# Patient Record
Sex: Female | Born: 1990 | Hispanic: Yes | Marital: Married | Smoking: Never smoker
Health system: Southern US, Community
[De-identification: ages and names within clinical notes are randomized; demographics above are authoritative.]

## PROBLEM LIST (undated history)

## (undated) ENCOUNTER — Inpatient Hospital Stay (HOSPITAL_COMMUNITY): Payer: Self-pay

## (undated) DIAGNOSIS — K8020 Calculus of gallbladder without cholecystitis without obstruction: Secondary | ICD-10-CM

## (undated) DIAGNOSIS — Z30017 Encounter for initial prescription of implantable subdermal contraceptive: Principal | ICD-10-CM

## (undated) DIAGNOSIS — F419 Anxiety disorder, unspecified: Secondary | ICD-10-CM

## (undated) DIAGNOSIS — F909 Attention-deficit hyperactivity disorder, unspecified type: Secondary | ICD-10-CM

## (undated) DIAGNOSIS — Z3490 Encounter for supervision of normal pregnancy, unspecified, unspecified trimester: Secondary | ICD-10-CM

## (undated) HISTORY — DX: Anxiety disorder, unspecified: F41.9

## (undated) HISTORY — DX: Attention-deficit hyperactivity disorder, unspecified type: F90.9

## (undated) HISTORY — DX: Encounter for initial prescription of implantable subdermal contraceptive: Z30.017

## (undated) HISTORY — PX: NO PAST SURGERIES: SHX2092

---

## 2010-01-07 ENCOUNTER — Emergency Department (HOSPITAL_COMMUNITY): Admission: EM | Admit: 2010-01-07 | Discharge: 2010-01-07 | Payer: Self-pay | Admitting: Emergency Medicine

## 2010-08-21 LAB — URINALYSIS, ROUTINE W REFLEX MICROSCOPIC
Bilirubin Urine: NEGATIVE
Nitrite: NEGATIVE
Specific Gravity, Urine: 1.02 (ref 1.005–1.030)
Urobilinogen, UA: 0.2 mg/dL (ref 0.0–1.0)

## 2010-08-21 LAB — COMPREHENSIVE METABOLIC PANEL
ALT: 35 U/L (ref 0–35)
AST: 31 U/L (ref 0–37)
Albumin: 3.7 g/dL (ref 3.5–5.2)
CO2: 26 mEq/L (ref 19–32)
Calcium: 8.4 mg/dL (ref 8.4–10.5)
GFR calc Af Amer: >60 mL/min (ref >60)
GFR calc non Af Amer: >60 mL/min (ref >60)
Sodium: 139 mEq/L (ref 135–145)
Total Protein: 6.7 g/dL (ref 6.0–8.3)

## 2010-08-21 LAB — DIFFERENTIAL
Eosinophils Absolute: 0 10*3/uL (ref 0.0–0.7)
Eosinophils Relative: 0 % (ref 0–5)
Lymphs Abs: 0.7 10*3/uL (ref 0.7–4.0)
Monocytes Absolute: 0.6 10*3/uL (ref 0.1–1.0)
Monocytes Relative: 4 % (ref 3–12)

## 2010-08-21 LAB — URINE CULTURE: Culture: NO GROWTH

## 2010-08-21 LAB — URINE MICROSCOPIC-ADD ON

## 2010-08-21 LAB — CBC
Hemoglobin: 12.4 g/dL (ref 12.0–15.0)
MCHC: 34.4 g/dL (ref 30.0–36.0)

## 2010-08-21 LAB — PREGNANCY, URINE: Preg Test, Ur: NEGATIVE

## 2010-09-13 ENCOUNTER — Emergency Department (HOSPITAL_COMMUNITY)
Admission: EM | Admit: 2010-09-13 | Discharge: 2010-09-14 | Disposition: A | Discharge status: Home / Self Care | Payer: Medicaid Other | Attending: Emergency Medicine | Admitting: Emergency Medicine

## 2010-09-13 DIAGNOSIS — R197 Diarrhea, unspecified: Secondary | ICD-10-CM | POA: Insufficient documentation

## 2010-09-13 DIAGNOSIS — R109 Unspecified abdominal pain: Secondary | ICD-10-CM | POA: Insufficient documentation

## 2010-09-13 DIAGNOSIS — R1915 Other abnormal bowel sounds: Secondary | ICD-10-CM | POA: Insufficient documentation

## 2010-09-13 DIAGNOSIS — R112 Nausea with vomiting, unspecified: Secondary | ICD-10-CM | POA: Insufficient documentation

## 2010-09-14 LAB — URINALYSIS, ROUTINE W REFLEX MICROSCOPIC
Bilirubin Urine: NEGATIVE
Hgb urine dipstick: NEGATIVE
Specific Gravity, Urine: >1.03 — ABNORMAL HIGH (ref 1.005–1.030)
pH: 6.5 (ref 5.0–8.0)

## 2010-09-14 LAB — URINE MICROSCOPIC-ADD ON

## 2011-03-17 LAB — OB RESULTS CONSOLE ABO/RH

## 2011-03-17 LAB — OB RESULTS CONSOLE RUBELLA ANTIBODY, IGM: Rubella: IMMUNE

## 2011-05-14 ENCOUNTER — Observation Stay (HOSPITAL_COMMUNITY)
Admission: EM | Admit: 2011-05-14 | Discharge: 2011-05-15 | Disposition: A | Discharge status: Home / Self Care | Payer: Medicaid Other | Attending: Internal Medicine | Admitting: Internal Medicine

## 2011-05-14 ENCOUNTER — Encounter: Payer: Self-pay | Admitting: *Deleted

## 2011-05-14 DIAGNOSIS — J101 Influenza due to other identified influenza virus with other respiratory manifestations: Secondary | ICD-10-CM

## 2011-05-14 DIAGNOSIS — E860 Dehydration: Secondary | ICD-10-CM | POA: Diagnosis present

## 2011-05-14 DIAGNOSIS — J111 Influenza due to unidentified influenza virus with other respiratory manifestations: Secondary | ICD-10-CM | POA: Insufficient documentation

## 2011-05-14 DIAGNOSIS — R509 Fever, unspecified: Secondary | ICD-10-CM | POA: Insufficient documentation

## 2011-05-14 DIAGNOSIS — O99891 Other specified diseases and conditions complicating pregnancy: Principal | ICD-10-CM | POA: Insufficient documentation

## 2011-05-14 LAB — DIFFERENTIAL
Eosinophils Relative: 0 % (ref 0–5)
Lymphocytes Relative: 9 % — ABNORMAL LOW (ref 12–46)
Lymphs Abs: 0.7 10*3/uL (ref 0.7–4.0)
Monocytes Absolute: 0.5 10*3/uL (ref 0.1–1.0)
Neutro Abs: 6.4 10*3/uL (ref 1.7–7.7)

## 2011-05-14 LAB — INFLUENZA PANEL BY PCR (TYPE A & B)
H1N1 flu by pcr: NOT DETECTED
Influenza A By PCR: POSITIVE — AB

## 2011-05-14 LAB — CBC
HCT: 32.3 % — ABNORMAL LOW (ref 36.0–46.0)
MCV: 87.5 fL (ref 78.0–100.0)
Platelets: 188 10*3/uL (ref 150–400)
RBC: 3.69 MIL/uL — ABNORMAL LOW (ref 3.87–5.11)
WBC: 7.6 10*3/uL (ref 4.0–10.5)

## 2011-05-14 LAB — URINALYSIS, MICROSCOPIC ONLY
Bilirubin Urine: NEGATIVE
Ketones, ur: >80 mg/dL — AB
Leukocytes, UA: NEGATIVE
Nitrite: NEGATIVE
Urobilinogen, UA: 1 mg/dL (ref 0.0–1.0)
pH: 6.5 (ref 5.0–8.0)

## 2011-05-14 LAB — HCG, QUANTITATIVE, PREGNANCY: hCG, Beta Chain, Quant, S: 39199 m[IU]/mL — ABNORMAL HIGH (ref <5)

## 2011-05-14 LAB — BASIC METABOLIC PANEL
CO2: 21 mEq/L (ref 19–32)
Calcium: 9.2 mg/dL (ref 8.4–10.5)
Chloride: 99 mEq/L (ref 96–112)
GFR calc Af Amer: >90 mL/min (ref >90)
Sodium: 133 mEq/L — ABNORMAL LOW (ref 135–145)

## 2011-05-14 MED ORDER — MORPHINE SULFATE 2 MG/ML IJ SOLN
2.0000 mg | Freq: Once | INTRAMUSCULAR | Status: AC
  Administered 2011-05-14: 2 mg via INTRAVENOUS

## 2011-05-14 MED ORDER — OSELTAMIVIR PHOSPHATE 75 MG PO CAPS
75.0000 mg | ORAL_CAPSULE | Freq: Once | ORAL | Status: AC
  Administered 2011-05-14: 75 mg via ORAL

## 2011-05-14 MED ORDER — SODIUM CHLORIDE 0.9 % IV BOLUS (SEPSIS)
1000.0000 mL | Freq: Once | INTRAVENOUS | Status: AC
  Administered 2011-05-14: 1000 mL via INTRAVENOUS

## 2011-05-14 MED ORDER — ONDANSETRON HCL 4 MG/2ML IJ SOLN
4.0000 mg | Freq: Once | INTRAMUSCULAR | Status: AC
  Administered 2011-05-14: 4 mg via INTRAVENOUS

## 2011-05-14 MED ORDER — ACETAMINOPHEN 500 MG PO TABS
1000.0000 mg | ORAL_TABLET | Freq: Once | ORAL | Status: AC
  Administered 2011-05-14: 1000 mg via ORAL

## 2011-05-14 MED ORDER — SODIUM CHLORIDE 0.9 % IV SOLN
Freq: Once | INTRAVENOUS | Status: AC
  Administered 2011-05-14: 20:00:00 via INTRAVENOUS

## 2011-05-14 NOTE — ED Notes (Signed)
CRITICAL VALUE ALERT  Critical value received:  Positive flu swab  Date of notification:  05/14/2011  Time of notification:  21:28  Critical value read back: yes  Nurse who received alert:  Juliette Alcide, RN   MD notified (1st page):  Dr. Colon Branch  Time of first page:  21:29  MD notified (2nd page):  Time of second page:  Responding MD:   Time MD responded:

## 2011-05-14 NOTE — ED Notes (Signed)
Pt c/o fever and cough x 2 days; pt states she is [redacted] weeks pregnant; pt denies any pregnancy problems

## 2011-05-14 NOTE — ED Provider Notes (Signed)
History     CSN: [id number] Arrival date & time: 05/14/2011  7:00 PM   First MD Initiated Contact with Patient 05/14/11 1902      Chief Complaint  Patient presents with  . Fever  . Cough    (Consider location/radiation/quality/duration/timing/severity/associated sxs/prior treatment) HPI Comments: Linda Pollard is a 20 y.o. female G1, P0, [redacted] weeks pregnant , [facility] 10/17/11 who presents to the Emergency Department complaining of flu symptoms she has had for two days which include fever, chills, cough, headache myalgias  and nausea. She has taken tylenol with some relief.   History reviewed. No pertinent past medical history.  History reviewed. No pertinent past surgical history.  History reviewed. No pertinent family history.  History  Substance Use Topics  . Smoking status: Never Smoker   . Smokeless tobacco: Not on file  . Alcohol Use: No    OB History    Grav Para Term Preterm Abortions TAB SAB Ect Mult Living   1               Review of Systems 10 Systems reviewed and are negative for acute change except as noted in the HPI. Allergies  Review of patient's allergies indicates no known allergies.  Home Medications   Current Outpatient Rx  Name Route Sig Dispense Refill  . ACETAMINOPHEN 500 MG PO TABS Oral Take 500 mg by mouth once as needed. For fever/pain     . GUAIFENESIN 100 MG/5ML PO SOLN Oral Take 10 mLs by mouth 2 (two) times daily as needed. For cough     . PRENATAL 27-0.8 MG PO TABS Oral Take 1 tablet by mouth daily.        BP 112/53  Pulse 119  Temp(Src) 100.8 F (38.2 C) (Oral)  Resp 20  Ht 4\' 11"  (1.499 m)  Wt 146 lb (66.225 kg)  BMI 29.49 kg/m2  SpO2 97%  Physical Exam  Nursing note and vitals reviewed. Constitutional: She is oriented to person, place, and time. She appears well-developed and well-nourished.  HENT:  Head: Normocephalic and atraumatic.  Right Ear: External ear normal.  Left Ear: External ear normal.  Mouth/Throat:  Oropharynx is clear and moist.  Eyes: EOM are normal. Pupils are equal, round, and reactive to light.  Neck: Normal range of motion. Neck supple.  Cardiovascular: Normal heart sounds and intact distal pulses.        tachycardia  Pulmonary/Chest: Effort normal and breath sounds normal. No respiratory distress. She has no wheezes. She has no rales.  Abdominal: Soft. Bowel sounds are normal.  Musculoskeletal: Normal range of motion.  Neurological: She is alert and oriented to person, place, and time. She has normal reflexes.  Skin: Skin is warm and dry.  Psychiatric: She has a normal mood and affect.    ED Course  Procedures (including critical care time)  Results for orders placed during the hospital encounter of 05/14/11  CBC      Component Value Range   WBC 7.6  4.0 - 10.5 (K/uL)   RBC 3.69 (*) 3.87 - 5.11 (MIL/uL)   Hemoglobin 11.4 (*) 12.0 - 15.0 (g/dL)   HCT 40.9 (*) 81.1 - 46.0 (%)   MCV 87.5  78.0 - 100.0 (fL)   MCH 30.9  26.0 - 34.0 (pg)   MCHC 35.3  30.0 - 36.0 (g/dL)   RDW 91.4  78.2 - 95.6 (%)   Platelets 188  150 - 400 (K/uL)  DIFFERENTIAL      Component Value Range  Neutrophils Relative 85 (*) 43 - 77 (%)   Neutro Abs 6.4  1.7 - 7.7 (K/uL)   Lymphocytes Relative 9 (*) 12 - 46 (%)   Lymphs Abs 0.7  0.7 - 4.0 (K/uL)   Monocytes Relative 7  3 - 12 (%)   Monocytes Absolute 0.5  0.1 - 1.0 (K/uL)   Eosinophils Relative 0  0 - 5 (%)   Eosinophils Absolute 0.0  0.0 - 0.7 (K/uL)   Basophils Relative 0  0 - 1 (%)   Basophils Absolute 0.0  0.0 - 0.1 (K/uL)  INFLUENZA PANEL BY PCR      Component Value Range   Influenza A By PCR POSITIVE (*) NEGATIVE    Influenza B By PCR NEGATIVE  NEGATIVE    H1N1 flu by pcr NOT DETECTED  NOT DETECTED   HCG, QUANTITATIVE, PREGNANCY      Component Value Range   hCG, Beta Chain, Quant, S 39199 (*) <5 (mIU/mL)  URINALYSIS, MICROSCOPIC ONLY      Component Value Range   Color, Urine YELLOW  YELLOW    APPearance HAZY (*) CLEAR     Specific Gravity, Urine 1.025  1.005 - 1.030    pH 6.5  5.0 - 8.0    Glucose, UA NEGATIVE  NEGATIVE (mg/dL)   Hgb urine dipstick NEGATIVE  NEGATIVE    Bilirubin Urine NEGATIVE  NEGATIVE    Ketones, ur >80 (*) NEGATIVE (mg/dL)   Protein, ur 30 (*) NEGATIVE (mg/dL)   Urobilinogen, UA 1.0  0.0 - 1.0 (mg/dL)   Nitrite NEGATIVE  NEGATIVE    Leukocytes, UA NEGATIVE  NEGATIVE    WBC, UA 3-6  <3 (WBC/hpf)   RBC / HPF 0-2  <3 (RBC/hpf)   Bacteria, UA FEW (*) RARE    Squamous Epithelial / LPF MANY (*) RARE    Urine-Other MUCOUS PRESENT      2205 Spoke with Dr. Emelda Fear, OB/GYN. He advised that <20 weeks can be admitted by hospitalist. Advised we can administer tamiflu in pregnancy. 2212 Spoke with Dr. Earlene Plater, hospitalist who will admit patient. She asked patient be placed on telemetry.   MDM   Patient G1, P0, [redacted] weeks pregnant, [facility] 10/17/10 here with flu symptoms, positive influenza A result. Initiated  resusitation with IVF x 2 liters. HR normalized. Fever responded to tylenol.Patient taking PO fluids.Pt stable in ED with no significant deterioration in condition.The patient appears reasonably stabilized for admission considering the current resources, flow, and capabilities available in the ED at this time, and I doubt any other [facility] requiring further screening and/or treatment in the ED prior to admission.  MDM Reviewed: nursing note and vitals Interpretation: labs Total time providing critical care: 30. Consults: OB/GYN (hospitalist)          Nicoletta Dress. Colon Branch, MD 05/14/11 2219

## 2011-05-14 NOTE — H&P (Signed)
  Chief Complaint:  fever  HPI: 20 year old healthy female who is [redacted] weeks pregnant comes in with two-day history of fever cough and malaise found to have influenza in the ED. She is a little dehydrated and tachycardic from the fever. She denies any nausea vomiting diarrhea or any rashes. She's received 2-3 L of IV fluids in the emergency department and already feels better. She started received Tamiflu. Her significant other has also been running fever with the same symptoms who likely has the flu also. She has not received the flu shot this year.  Review of Systems:  Otherwise negative.  Past Medical History: neg  Medications: Prior to Admission medications   Medication Sig Start Date End Date Taking? Authorizing Provider  acetaminophen (TYLENOL) 500 MG tablet Take 500 mg by mouth once as needed. For fever/pain    Yes Historical Provider, MD  guaiFENesin (ROBITUSSIN) 100 MG/5ML SOLN Take 10 mLs by mouth 2 (two) times daily as needed. For cough    Yes Historical Provider, MD  Prenatal Vit-Fe Fumarate-FA (MULTIVITAMIN-PRENATAL) 27-0.8 MG TABS Take 1 tablet by mouth daily.     Yes Historical Provider, MD    Allergies:  No Known Allergies  Social History:  reports that she has never smoked. She does not have any smokeless tobacco history on file. She reports that she does not drink alcohol or use illicit drugs.  Family History: History reviewed. No pertinent family history.  Physical Exam: Filed Vitals:   05/14/11 1600 05/14/11 1605 05/14/11 2022 05/14/11 2227  BP:  97/71 112/53 97/36  Pulse:  142 119 117  Temp:  102.1 F (38.9 C) 100.8 F (38.2 C) 98.5 F (36.9 C)  TempSrc:  Oral Oral   Resp:  20 20 19   Height: 4\' 11"  (1.499 m)     Weight: 66.225 kg (146 lb)     SpO2:  100% 97% 98%   General appearance: alert, cooperative and no distress Resp: clear to auscultation bilaterally Cardio: regular rate and rhythm, S1, S2 normal, no murmur, click, rub or gallop GI: soft,  non-tender; bowel sounds normal; no masses,  no organomegaly Extremities: extremities normal, atraumatic, no cyanosis or edema Pulses: 2+ and symmetric Skin: Skin color, texture, turgor normal. No rashes or lesions Neurologic: Grossly normal   Labs on Admission:   [facility] 05/14/11 1947  NA 133*  K 2.9*  CL 99  CO2 21  GLUCOSE 106*  BUN 5*  CREATININE 0.48*  CALCIUM 9.2  MG --  PHOS --    Basename 05/14/11 1946  WBC 7.6  NEUTROABS 6.4  HGB 11.4*  HCT 32.3*  MCV 87.5  PLT 188    Radiological Exams on Admission: No results found.  Assessment/Plan Present on Admission:  20 yo female 16 wks IUP here with influenza and mild dehydration. .Influenza tamiflu bid.  No cxr done due to pregnancy. .Dehydration ivf overnight.  obs.  Kila Godina A 05/14/2011, 11:49 PM

## 2011-05-14 NOTE — ED Notes (Signed)
Pt states she has also been vomiting

## 2011-05-14 NOTE — ED Notes (Signed)
REPORT given to [place], [place]

## 2011-05-14 NOTE — ED Notes (Signed)
Pt c/o fever, cough, uri for a few days, n/v last night, recent exposure to sick family members,

## 2011-05-14 NOTE — ED Notes (Signed)
Dr. Colon Branch notified of positive flu results

## 2011-05-15 ENCOUNTER — Encounter (HOSPITAL_COMMUNITY): Payer: Self-pay | Admitting: *Deleted

## 2011-05-15 LAB — CBC
Hemoglobin: 10.2 g/dL — ABNORMAL LOW (ref 12.0–15.0)
MCH: 30.4 pg (ref 26.0–34.0)
Platelets: 165 10*3/uL (ref 150–400)
RBC: 3.36 MIL/uL — ABNORMAL LOW (ref 3.87–5.11)
WBC: 4.6 10*3/uL (ref 4.0–10.5)

## 2011-05-15 LAB — BASIC METABOLIC PANEL
Calcium: 8.3 mg/dL — ABNORMAL LOW (ref 8.4–10.5)
GFR calc non Af Amer: >90 mL/min (ref >90)
Glucose, Bld: 96 mg/dL (ref 70–99)
Potassium: 3.7 mEq/L (ref 3.5–5.1)
Sodium: 137 mEq/L (ref 135–145)

## 2011-05-15 MED ORDER — OSELTAMIVIR PHOSPHATE 75 MG PO CAPS
75.0000 mg | ORAL_CAPSULE | Freq: Two times a day (BID) | ORAL | Status: DC
  Administered 2011-05-15: 75 mg via ORAL
  Filled 2011-05-15: qty 1

## 2011-05-15 MED ORDER — ONDANSETRON HCL 4 MG/2ML IJ SOLN: 4.0000 mg | Freq: Four times a day (QID) | INTRAMUSCULAR | Status: DC | PRN

## 2011-05-15 MED ORDER — ACETAMINOPHEN 325 MG PO TABS
ORAL_TABLET | ORAL | Status: AC
  Administered 2011-05-15: 650 mg
  Filled 2011-05-15: qty 2

## 2011-05-15 MED ORDER — OSELTAMIVIR PHOSPHATE 75 MG PO CAPS: 75.0000 mg | ORAL_CAPSULE | Freq: Two times a day (BID) | ORAL | Status: AC

## 2011-05-15 MED ORDER — ACETAMINOPHEN 325 MG PO TABS: 650.0000 mg | ORAL_TABLET | Freq: Four times a day (QID) | ORAL | Status: DC | PRN

## 2011-05-15 MED ORDER — POTASSIUM CHLORIDE IN NACL 40-0.9 MEQ/L-% IV SOLN
INTRAVENOUS | Status: AC
  Filled 2011-05-15: qty 1000

## 2011-05-15 MED ORDER — ONDANSETRON HCL 4 MG PO TABS: 4.0000 mg | ORAL_TABLET | Freq: Four times a day (QID) | ORAL | Status: DC | PRN

## 2011-05-15 MED ORDER — POTASSIUM CHLORIDE IN NACL 40-0.9 MEQ/L-% IV SOLN
INTRAVENOUS | Status: DC
  Administered 2011-05-15 (×2): via INTRAVENOUS
  Filled 2011-05-15 (×5): qty 1000

## 2011-05-15 MED ORDER — PRENATAL 27-0.8 MG PO TABS
1.0000 | ORAL_TABLET | Freq: Every day | ORAL | Status: DC
  Administered 2011-05-15: 1 via ORAL
  Filled 2011-05-15 (×3): qty 1

## 2011-05-15 NOTE — Discharge Summary (Signed)
Patient ID: Linda Pollard MRN: [id number] DOB/AGE: 25-Oct-1990 20 y.o.  Admit date: 05/14/2011 Discharge date: 05/15/2011  Primary Care Physician:  No primary provider on file.  Discharge Diagnoses:    Present on Admission:  .Influenza .Dehydration Tachycardia, improved Fever Inrauterine pregnancy   Current Discharge Medication List    START taking these medications   Details  oseltamivir (TAMIFLU) 75 MG capsule Take 1 capsule (75 mg total) by mouth 2 (two) times daily. Qty: 8 capsule, Refills: 0      CONTINUE these medications which have NOT CHANGED   Details  acetaminophen (TYLENOL) 500 MG tablet Take 500 mg by mouth once as needed. For fever/pain     guaiFENesin (ROBITUSSIN) 100 MG/5ML SOLN Take 10 mLs by mouth 2 (two) times daily as needed. For cough     Prenatal Vit-Fe Fumarate-FA (MULTIVITAMIN-PRENATAL) 27-0.8 MG TABS Take 1 tablet by mouth daily.          Disposition and Follow-up: Follow up with OB/GYN as previously scheduled  Consults: None.  Significant Diagnostic Studies:  No results found.  Brief H and P: For complete details please refer to admission H and P, but in brief 20 year old healthy female who is [redacted] weeks pregnant comes in with two-day history of fever cough and malaise found to have influenza in the ED. She is a little dehydrated and tachycardic from the fever. She denies any nausea vomiting diarrhea or any rashes. She's received 2-3 L of IV fluids in the emergency department and already feels better. She started received Tamiflu. Her significant other has also been running fever with the same symptoms who likely has the flu also. She has not received the flu shot this year.   Physical Exam on Discharge:  Filed Vitals:   05/15/11 0010 05/15/11 0300 05/15/11 0600 05/15/11 0900  BP: 112/60  102/65   Pulse: 110  128   Temp: 97.9 F (36.6 C)  98.4 F (36.9 C) 99.4 F (37.4 C)  TempSrc: Oral  Oral   Resp: 16  18   Height: 4\' 11"  (1.499 m)      Weight: 67.1 kg (147 lb 14.9 oz) 67.2 kg (148 lb 2.4 oz)    SpO2: 97%  98%      Intake/Output Summary (Last 24 hours) at 05/15/11 1353 Last data filed at 05/15/11 1200  Gross per 24 hour  Intake   3639 ml  Output   1250 ml  Net   2389 ml   HR has improved and currently in 80's on manual check, she is ambulating without dizziness General: Alert, awake, oriented x3, in no acute distress. HEENT: No bruits, no goiter. Heart: Regular rate and rhythm, without murmurs, rubs, gallops. Lungs: Clear to auscultation bilaterally. Abdomen: Soft, nontender, nondistended, positive bowel sounds. Extremities: No clubbing cyanosis or edema with positive pedal pulses. Neuro: Grossly intact, nonfocal.  CBC:    Component Value Date/Time   WBC 4.6 05/15/2011 0720   HGB 10.2* 05/15/2011 0720   HCT 29.9* 05/15/2011 0720   PLT 165 05/15/2011 0720   MCV 89.0 05/15/2011 0720   NEUTROABS 6.4 05/14/2011 1946   LYMPHSABS 0.7 05/14/2011 1946   MONOABS 0.5 05/14/2011 1946   EOSABS 0.0 05/14/2011 1946   BASOSABS 0.0 05/14/2011 1946    Basic Metabolic Panel:    Component Value Date/Time   NA 137 05/15/2011 0720   K 3.7 05/15/2011 0720   CL 106 05/15/2011 0720   CO2 26 05/15/2011 0720   BUN <3* 05/15/2011 0720  CREATININE 0.51 05/15/2011 0720   GLUCOSE 96 05/15/2011 0720   CALCIUM 8.3* 05/15/2011 0720    Hospital Course:  Principal Problem:  *Influenza, Patient was essentially admitted for supportive treatment.  She was given IV fluids and started on tamiflu.  She reports symptomatic improvement with this treatment.  She is eating and drinking without any nausea and vomiting.  She is ambulating without dizziness.  I feel that she no longer requires inpatient acute hospital care.  We will plan on discharging her today.  I have encouraged her to drink plenty of fluids and use tylenol as needed for pain/fever.   Active Problems:  IUP (intrauterine pregnancy), incidental, case was discussed by ED staff with Dr.  Emelda Fear and it was felt that they did not need to officially consult on the patient.  Patient will follow up with her primary ob/gyn  Dehydration, resolved with IVF   Time spent on Discharge:  Signed: Retha Bither 05/15/2011, 1:53 PM

## 2011-05-15 NOTE — Progress Notes (Signed)
Fetal heart rate 137

## 2011-05-17 NOTE — Progress Notes (Signed)
Utilization review completed.  

## 2011-06-05 ENCOUNTER — Encounter (HOSPITAL_COMMUNITY): Payer: Self-pay

## 2011-06-05 ENCOUNTER — Emergency Department (HOSPITAL_COMMUNITY)
Admission: EM | Admit: 2011-06-05 | Discharge: 2011-06-05 | Disposition: A | Discharge status: Home / Self Care | Payer: Medicaid Other | Attending: Emergency Medicine | Admitting: Emergency Medicine

## 2011-06-05 DIAGNOSIS — R1031 Right lower quadrant pain: Secondary | ICD-10-CM | POA: Insufficient documentation

## 2011-06-05 DIAGNOSIS — F411 Generalized anxiety disorder: Secondary | ICD-10-CM | POA: Insufficient documentation

## 2011-06-05 DIAGNOSIS — M79609 Pain in unspecified limb: Secondary | ICD-10-CM | POA: Insufficient documentation

## 2011-06-05 DIAGNOSIS — O2390 Unspecified genitourinary tract infection in pregnancy, unspecified trimester: Secondary | ICD-10-CM | POA: Insufficient documentation

## 2011-06-05 DIAGNOSIS — N390 Urinary tract infection, site not specified: Secondary | ICD-10-CM | POA: Insufficient documentation

## 2011-06-05 LAB — CBC
HCT: 34.5 % — ABNORMAL LOW (ref 36.0–46.0)
Hemoglobin: 11.7 g/dL — ABNORMAL LOW (ref 12.0–15.0)
MCH: 30.5 pg (ref 26.0–34.0)
MCHC: 33.9 g/dL (ref 30.0–36.0)
RDW: 14.3 % (ref 11.5–15.5)

## 2011-06-05 LAB — BASIC METABOLIC PANEL
BUN: 5 mg/dL — ABNORMAL LOW (ref 6–23)
Calcium: 10.1 mg/dL (ref 8.4–10.5)
Creatinine, Ser: 0.49 mg/dL — ABNORMAL LOW (ref 0.50–1.10)
GFR calc Af Amer: >90 mL/min (ref >90)
GFR calc non Af Amer: >90 mL/min (ref >90)

## 2011-06-05 LAB — DIFFERENTIAL
Basophils Relative: 0 % (ref 0–1)
Eosinophils Absolute: 0 10*3/uL (ref 0.0–0.7)
Monocytes Absolute: 0.8 10*3/uL (ref 0.1–1.0)
Monocytes Relative: 7 % (ref 3–12)

## 2011-06-05 LAB — URINALYSIS, ROUTINE W REFLEX MICROSCOPIC
Ketones, ur: 15 mg/dL — AB
Nitrite: NEGATIVE
Protein, ur: 30 mg/dL — AB
Urobilinogen, UA: 0.2 mg/dL (ref 0.0–1.0)

## 2011-06-05 LAB — URINE MICROSCOPIC-ADD ON

## 2011-06-05 LAB — WET PREP, GENITAL: Clue Cells Wet Prep HPF POC: NONE SEEN

## 2011-06-05 MED ORDER — NITROFURANTOIN MONOHYD MACRO 100 MG PO CAPS: 100.0000 mg | ORAL_CAPSULE | Freq: Two times a day (BID) | ORAL | Status: AC

## 2011-06-05 MED ORDER — SODIUM CHLORIDE 0.9 % IV BOLUS (SEPSIS)
500.0000 mL | Freq: Once | INTRAVENOUS | Status: AC
  Administered 2011-06-05: 500 mL via INTRAVENOUS

## 2011-06-05 MED ORDER — NITROFURANTOIN MACROCRYSTAL 100 MG PO CAPS
ORAL_CAPSULE | ORAL | Status: AC
  Administered 2011-06-05: 100 mg
  Filled 2011-06-05: qty 1

## 2011-06-05 MED ORDER — NITROFURANTOIN MONOHYD MACRO 100 MG PO CAPS
100.0000 mg | ORAL_CAPSULE | Freq: Once | ORAL | Status: DC
  Filled 2011-06-05: qty 1

## 2011-06-05 NOTE — ED Provider Notes (Signed)
This chart was scribed for [employer]. Rubin Payor, MD by [place]is Mart. The patient was seen in room APA09/APA09 and the patient's care was started at 6:16 PM.    CSN: [id number]  Arrival date & time 06/05/11  1743   First MD Initiated Contact with Patient 06/05/11 1809      Chief Complaint  Patient presents with  . Abdominal Cramping  . Leg Pain    (Consider location/radiation/quality/duration/timing/severity/associated sxs/prior treatment) HPI Pt seen at 6:16 PM Linda Pollard is a 20 y.o. [redacted] weeks pregnant  female who presents to the Emergency Department complaining of sudden onset, constant, gradually worsening right lower abdominal pain that started 2 days ago .  Pt states that pain started in the right leg and radiated to the abdomen.  Pt describes pain as "cramping".    Pt c/o yellowish vaginal discharge, "brownish" urine, and spotted some blood on the toilet paper after urinating in the ER .  Pt c/o subjective fever. Pt denies dysuria, n/v/d, constipation.  Pt's last ultrasound was 1 month ago with normal results and next ultrasound is Thurs.    PCP: Dr Emelda Fear  History reviewed. No pertinent past medical history.  History reviewed. No pertinent past surgical history.  No family history on file.  History  Substance Use Topics  . Smoking status: Never Smoker   . Smokeless tobacco: Never Used  . Alcohol Use: No    OB History    Grav Para Term Preterm Abortions TAB SAB Ect Mult Living   1               Review of Systems  10 Systems reviewed and are negative for acute change except as noted in the HPI.   Allergies  Review of patient's allergies indicates no known allergies.  Home Medications   Current Outpatient Rx  Name Route Sig Dispense Refill  . ACETAMINOPHEN 500 MG PO TABS Oral Take 500 mg by mouth once as needed. For fever/pain     . GUAIFENESIN 100 MG/5ML PO SOLN Oral Take 10 mLs by mouth 2 (two) times daily as needed. For cough     . PRENATAL  27-0.8 MG PO TABS Oral Take 1 tablet by mouth daily.      Marland Kitchen NITROFURANTOIN MONOHYD MACRO 100 MG PO CAPS Oral Take 1 capsule (100 mg total) by mouth 2 (two) times daily. 9 capsule 0    BP 108/60  Pulse 100  Temp(Src) 98.8 F (37.1 C) (Oral)  Resp 22  Ht 4\' 11"  (1.499 m)  Wt 143 lb (64.864 kg)  BMI 28.88 kg/m2  SpO2 98%  Physical Exam  Nursing note and vitals reviewed. Constitutional: She is oriented to person, place, and time. She appears well-developed and well-nourished.       Pt appears anxious and tearful  HENT:  Head: Normocephalic and atraumatic.  Eyes: EOM are normal. Pupils are equal, round, and reactive to light.  Neck: Normal range of motion. Neck supple. No tracheal deviation present.  Cardiovascular: Normal rate, regular rhythm and normal heart sounds.   Pulmonary/Chest: Effort normal and breath sounds normal. No respiratory distress.  Abdominal: Soft. She exhibits no distension.       Gravid to umbilicus  Musculoskeletal: Normal range of motion. She exhibits no edema.  Neurological: She is alert and oriented to person, place, and time. No sensory deficit.  Skin: Skin is warm and dry.  Psychiatric: She has a normal mood and affect. Her behavior is normal.   pelvic exam showed  no vaginal discharge. No active bleeding although there was some blood externally.  ED Course  Procedures (including critical care time) DIAGNOSTIC STUDIES: Oxygen Saturation is 98% on room air, normal by my interpretation.    COORDINATION OF CARE:    Labs Reviewed  CBC - Abnormal; Notable for the following:    WBC 11.8 (*)    RBC 3.84 (*)    Hemoglobin 11.7 (*)    HCT 34.5 (*)    All other components within normal limits  DIFFERENTIAL - Abnormal; Notable for the following:    Neutro Abs 8.9 (*)    All other components within normal limits  BASIC METABOLIC PANEL - Abnormal; Notable for the following:    Glucose, Bld 107 (*)    BUN 5 (*)    Creatinine, Ser 0.49 (*)    All other  components within normal limits  URINALYSIS, ROUTINE W REFLEX MICROSCOPIC - Abnormal; Notable for the following:    APPearance CLOUDY (*)    Hgb urine dipstick LARGE (*)    Bilirubin Urine SMALL (*)    Ketones, ur 15 (*)    Protein, ur 30 (*)    Leukocytes, UA TRACE (*)    All other components within normal limits  WET PREP, GENITAL - Abnormal; Notable for the following:    WBC, Wet Prep HPF POC FEW (*)    All other components within normal limits  URINE MICROSCOPIC-ADD ON - Abnormal; Notable for the following:    Bacteria, UA MANY (*)    All other components within normal limits   No results found.   1. UTI (lower urinary tract infection)   2. IUP (intrauterine pregnancy), incidental       MDM  Patient has lower abdominal cramping pain in her right leg. She is [redacted] weeks pregnant. She sees Dr. Emelda Fear. Lab work is remarkable for urinary tract infection. The pelvic exam did not show an open os or active bleeding. She's given antibiotics and will followup with GYN.  I personally performed the services described in this documentation, which was scribed in my presence. The recorded information has been reviewed and considered.         Juliet Rude. Rubin Payor, MD 06/06/11 [phone]

## 2011-06-05 NOTE — ED Notes (Signed)
Pt presents with right lower abdominal cramping that radiates into right leg x 2 days. Pt is 19 weeks and 4 days by pt report. Pt OB-GYN is [facility] Emelda Fear. Pt denies spotting but states urine was brown this AM.

## 2011-06-07 ENCOUNTER — Encounter (HOSPITAL_COMMUNITY): Payer: Self-pay | Admitting: Emergency Medicine

## 2011-06-07 ENCOUNTER — Emergency Department (HOSPITAL_COMMUNITY)
Admission: EM | Admit: 2011-06-07 | Discharge: 2011-06-08 | Disposition: A | Discharge status: Home / Self Care | Payer: Medicaid Other | Attending: Emergency Medicine | Admitting: Emergency Medicine

## 2011-06-07 DIAGNOSIS — R0602 Shortness of breath: Secondary | ICD-10-CM | POA: Insufficient documentation

## 2011-06-07 DIAGNOSIS — R079 Chest pain, unspecified: Secondary | ICD-10-CM | POA: Insufficient documentation

## 2011-06-07 DIAGNOSIS — M549 Dorsalgia, unspecified: Secondary | ICD-10-CM | POA: Insufficient documentation

## 2011-06-07 DIAGNOSIS — R10819 Abdominal tenderness, unspecified site: Secondary | ICD-10-CM | POA: Insufficient documentation

## 2011-06-07 DIAGNOSIS — R109 Unspecified abdominal pain: Secondary | ICD-10-CM | POA: Insufficient documentation

## 2011-06-07 DIAGNOSIS — R112 Nausea with vomiting, unspecified: Secondary | ICD-10-CM | POA: Insufficient documentation

## 2011-06-07 DIAGNOSIS — R1011 Right upper quadrant pain: Secondary | ICD-10-CM

## 2011-06-07 MED ORDER — ONDANSETRON HCL 4 MG/2ML IJ SOLN
4.0000 mg | Freq: Once | INTRAMUSCULAR | Status: AC
  Administered 2011-06-07: 4 mg via INTRAVENOUS
  Filled 2011-06-07: qty 2

## 2011-06-07 MED ORDER — SODIUM CHLORIDE 0.9 % IV BOLUS (SEPSIS)
1000.0000 mL | INTRAVENOUS | Status: AC
  Administered 2011-06-07: 1000 mL via INTRAVENOUS

## 2011-06-07 NOTE — ED Provider Notes (Signed)
History     CSN: [id number]  Arrival date & time 06/07/11  2308   First MD Initiated Contact with Patient 06/07/11 2319      Chief Complaint  Patient presents with  . Shortness of Breath  . Chest Pain  . Nausea  . Emesis  . Abdominal Pain  . Back Pain    (Consider location/radiation/quality/duration/timing/severity/associated sxs/prior treatment) HPI Comments: 20 year old female at [redacted] weeks gestation who presents with a complaint of epigastric pain. This is associated with mild shortness of breath and severe nausea and vomiting. The symptoms came on acutely approximately 2 hours prior to arrival, has been persistent and nothing makes better or worse. She denies dysuria, diarrhea, lower abdominal pain, leg pain, but states that the pain does radiate to her back. She denies alcohol use, tobacco use and has no history of pancreatitis, cholecystitis or prior abdominal surgery.  She was seen in the last 48 hours for a urinary tract infection and states that her symptoms had totally improved. The pain that she is having tonight is totally different than the pain she had 2 days ago.  Patient is a 20 y.o. female presenting with shortness of breath, chest pain, vomiting, abdominal pain, and back pain. The history is provided by the patient and medical records.  Shortness of Breath  Associated symptoms include chest pain and shortness of breath.  Chest Pain Primary symptoms include shortness of breath, abdominal pain and vomiting.    Emesis  Associated symptoms include abdominal pain.  Abdominal Pain The primary symptoms of the illness include abdominal pain, shortness of breath and vomiting.  Additional symptoms associated with the illness include back pain.  Back Pain  Associated symptoms include chest pain and abdominal pain.    History reviewed. No pertinent past medical history.  History reviewed. No pertinent past surgical history.  History reviewed. No pertinent family  history.  History  Substance Use Topics  . Smoking status: Never Smoker   . Smokeless tobacco: Never Used  . Alcohol Use: No    OB History    Grav Para Term Preterm Abortions TAB SAB Ect Mult Living   1               Review of Systems  Respiratory: Positive for shortness of breath.   Cardiovascular: Positive for chest pain.  Gastrointestinal: Positive for vomiting and abdominal pain.  Musculoskeletal: Positive for back pain.  All other systems reviewed and are negative.    Allergies  Review of patient's allergies indicates no known allergies.  Home Medications   Current Outpatient Rx  Name Route Sig Dispense Refill  . ACETAMINOPHEN 500 MG PO TABS Oral Take 500 mg by mouth once as needed. For fever/pain     . GUAIFENESIN 100 MG/5ML PO SOLN Oral Take 10 mLs by mouth 2 (two) times daily as needed. For cough     . NITROFURANTOIN MONOHYD MACRO 100 MG PO CAPS Oral Take 1 capsule (100 mg total) by mouth 2 (two) times daily. 9 capsule 0  . PRENATAL 27-0.8 MG PO TABS Oral Take 1 tablet by mouth daily.        BP 97/51  Pulse 89  Temp 98.5 F (36.9 C)  Resp 18  Ht 4\' 11"  (1.499 m)  Wt 146 lb (66.225 kg)  BMI 29.49 kg/m2  SpO2 98%  Physical Exam  Nursing note and vitals reviewed. Constitutional: She appears well-developed and well-nourished.  HENT:  Head: Normocephalic and atraumatic.  Mouth/Throat: Oropharynx is clear and  moist. No oropharyngeal exudate.  Eyes: Conjunctivae and EOM are normal. Pupils are equal, round, and reactive to light. Right eye exhibits no discharge. Left eye exhibits no discharge. No scleral icterus.  Neck: Normal range of motion. Neck supple. No JVD present. No thyromegaly present.  Cardiovascular: Normal rate, regular rhythm, normal heart sounds and intact distal pulses.  Exam reveals no gallop and no friction rub.   No murmur heard. Pulmonary/Chest: Effort normal and breath sounds normal. No respiratory distress. She has no wheezes. She has no  rales.  Abdominal: Soft. Bowel sounds are normal. She exhibits no distension and no mass. There is tenderness ( epigastric tenderness to palpation.  ).       No lower abdominal tenderness, no suprapubic, no right lower quadrant, no CVA tenderness. Non-peritoneal  Musculoskeletal: Normal range of motion. She exhibits no edema and no tenderness.  Lymphadenopathy:    She has no cervical adenopathy.  Neurological: She is alert. Coordination normal.  Skin: Skin is warm and dry. No rash noted. No erythema.  Psychiatric: She has a normal mood and affect. Her behavior is normal.    ED Course  Procedures (including critical care time)  Labs Reviewed  COMPREHENSIVE METABOLIC PANEL - Abnormal; Notable for the following:    Potassium 3.0 (*)    Glucose, Bld 124 (*)    Creatinine, Ser 0.44 (*)    Albumin 3.3 (*)    Total Bilirubin 0.2 (*)    All other components within normal limits  CBC - Abnormal; Notable for the following:    WBC 15.7 (*)    RBC 3.66 (*)    Hemoglobin 11.2 (*)    HCT 32.5 (*)    All other components within normal limits  DIFFERENTIAL - Abnormal; Notable for the following:    Neutro Abs 11.3 (*)    All other components within normal limits  URINALYSIS, ROUTINE W REFLEX MICROSCOPIC - Abnormal; Notable for the following:    Hgb urine dipstick LARGE (*)    Ketones, ur 15 (*)    All other components within normal limits  URINE MICROSCOPIC-ADD ON - Abnormal; Notable for the following:    Squamous Epithelial / LPF FEW (*)    Bacteria, UA MANY (*)    All other components within normal limits  LIPASE, BLOOD   No results found.   No diagnosis found.    MDM  Acute onset of epigastric pain. Will investigate with laboratory workup, IV fluids and Zofran.   Patient has responded somewhat to Zofran but has not improved with GI cocktail.  Laboratory evaluation shows low potassium, normal renal function, normal lipase, normal liver function tests. Urinalysis is clean other  than ketones, IV fluids have been given, blood counts elevated at 15.7.  On reevaluation the patient has persistent epigastric and right upper quadrant tenderness. Ultrasound ordered to rule out cholecystitis.   0700 AM - Change of shift, care signed out to Dr. Tama Headings, MD 06/08/11 [phone]

## 2011-06-07 NOTE — ED Notes (Addendum)
Patient reports upper abdominal pain, chest pain, nausea, vomiting, and shortness of breath that has been going on for about an hour and pain radiates to mid back. Patient seen here 2 days ago and diagnosed with UTI, now taking Macrobid.

## 2011-06-07 NOTE — ED Notes (Signed)
Pt c/o sob, n/v, chest pain, upper abdominal pain that radiates to her back. Pt is [redacted] wks pregnant.

## 2011-06-08 LAB — COMPREHENSIVE METABOLIC PANEL
ALT: 14 U/L (ref 0–35)
AST: 16 U/L (ref 0–37)
CO2: 25 mEq/L (ref 19–32)
Calcium: 9.5 mg/dL (ref 8.4–10.5)
Creatinine, Ser: 0.44 mg/dL — ABNORMAL LOW (ref 0.50–1.10)
GFR calc Af Amer: >90 mL/min (ref >90)
GFR calc non Af Amer: >90 mL/min (ref >90)
Glucose, Bld: 124 mg/dL — ABNORMAL HIGH (ref 70–99)
Potassium: 3 mEq/L — ABNORMAL LOW (ref 3.5–5.1)
Sodium: 136 mEq/L (ref 135–145)
Total Bilirubin: 0.2 mg/dL — ABNORMAL LOW (ref 0.3–1.2)

## 2011-06-08 LAB — DIFFERENTIAL
Basophils Absolute: 0 10*3/uL (ref 0.0–0.1)
Basophils Relative: 0 % (ref 0–1)
Eosinophils Absolute: 0.1 10*3/uL (ref 0.0–0.7)
Neutro Abs: 11.3 10*3/uL — ABNORMAL HIGH (ref 1.7–7.7)
Neutrophils Relative %: 72 % (ref 43–77)

## 2011-06-08 LAB — URINALYSIS, ROUTINE W REFLEX MICROSCOPIC
Leukocytes, UA: NEGATIVE
Nitrite: NEGATIVE
Protein, ur: NEGATIVE mg/dL
Specific Gravity, Urine: 1.015 (ref 1.005–1.030)
Urobilinogen, UA: 0.2 mg/dL (ref 0.0–1.0)

## 2011-06-08 LAB — URINE MICROSCOPIC-ADD ON

## 2011-06-08 LAB — CBC
Hemoglobin: 11.2 g/dL — ABNORMAL LOW (ref 12.0–15.0)
MCH: 30.6 pg (ref 26.0–34.0)
MCHC: 34.5 g/dL (ref 30.0–36.0)
Platelets: 244 10*3/uL (ref 150–400)

## 2011-06-08 MED ORDER — ONDANSETRON HCL 4 MG PO TABS: 4.0000 mg | ORAL_TABLET | Freq: Four times a day (QID) | ORAL | Status: DC

## 2011-06-08 MED ORDER — HYDROCODONE-ACETAMINOPHEN 5-500 MG PO TABS: 1.0000 | ORAL_TABLET | Freq: Four times a day (QID) | ORAL | Status: AC | PRN

## 2011-06-08 MED ORDER — MORPHINE SULFATE 4 MG/ML IJ SOLN
4.0000 mg | Freq: Once | INTRAMUSCULAR | Status: AC
  Administered 2011-06-08: 4 mg via INTRAVENOUS
  Filled 2011-06-08: qty 1

## 2011-06-08 MED ORDER — GI COCKTAIL ~~LOC~~
30.0000 mL | Freq: Once | ORAL | Status: AC
  Administered 2011-06-08: 30 mL via ORAL
  Filled 2011-06-08: qty 30

## 2011-06-08 MED ORDER — ONDANSETRON HCL 4 MG/2ML IJ SOLN
4.0000 mg | Freq: Once | INTRAMUSCULAR | Status: AC
  Administered 2011-06-08: 4 mg via INTRAVENOUS
  Filled 2011-06-08: qty 2

## 2011-06-08 MED ORDER — SODIUM CHLORIDE 0.9 % IV BOLUS (SEPSIS): 1000.0000 mL | Freq: Once | INTRAVENOUS | Status: DC

## 2011-06-08 NOTE — ED Provider Notes (Signed)
Care assumed from Dr. Hyacinth Meeker.  I agree with his note, assessment and plan.  Radiology called and informed me they would be unable to complete the ultrasound of the gallbladder as it is a holiday, but were able to arrange an appointment for tomorrow morning.  She will be discharged with medications for pain and nausea, to return prn.  Geoffery Lyons, MD 06/08/11 0830

## 2011-06-08 NOTE — L&D Delivery Note (Signed)
Operative Delivery Note At 3:42 AM a viable and healthy female was delivered via Vacuum Assisted Vaginal Delivery by Dr. Despina Hidden.  Presentation: vertex; Position: Left,, Right,, Anterior; Station: +3.  Verbal consent: obtained from patient for VAVD.  Risks and benefits discussed in detail.  Risks include, but are not limited to the risks of anesthesia, bleeding, infection, damage to maternal tissues, fetal cephalhematoma.  There is also the risk of inability to effect vaginal delivery of the head, or shoulder dystocia that cannot be resolved by established maneuvers, leading to the need for emergency cesarean section.   APGAR: , ; weight .   Placenta status: , .   Cord:  with the following complications: .  Cord pH: n/a  Anesthesia: Epidural  Episiotomy: None Lacerations: 2nd degree  Suture Repair: 3.0 vicryl rapide Est. Blood Loss (mL):   Mom to postpartum.  Baby to nursery-stable.  [facility] 10/31/2011, 3:51 AM

## 2011-06-08 NOTE — ED Notes (Signed)
Pt reports nausea is better.

## 2011-06-09 ENCOUNTER — Encounter (HOSPITAL_COMMUNITY): Payer: Self-pay | Admitting: Oncology

## 2011-06-09 ENCOUNTER — Ambulatory Visit (HOSPITAL_COMMUNITY)
Admit: 2011-06-09 | Discharge: 2011-06-09 | Disposition: A | Discharge status: Home / Self Care | Payer: Medicaid Other | Source: Ambulatory Visit | Attending: Emergency Medicine | Admitting: Emergency Medicine

## 2011-06-09 ENCOUNTER — Emergency Department (HOSPITAL_COMMUNITY)
Admission: EM | Admit: 2011-06-09 | Discharge: 2011-06-09 | Disposition: A | Discharge status: Home / Self Care | Payer: Medicaid Other | Attending: Emergency Medicine | Admitting: Emergency Medicine

## 2011-06-09 DIAGNOSIS — R112 Nausea with vomiting, unspecified: Secondary | ICD-10-CM | POA: Insufficient documentation

## 2011-06-09 DIAGNOSIS — R1013 Epigastric pain: Secondary | ICD-10-CM | POA: Insufficient documentation

## 2011-06-09 DIAGNOSIS — K8020 Calculus of gallbladder without cholecystitis without obstruction: Secondary | ICD-10-CM | POA: Insufficient documentation

## 2011-06-09 DIAGNOSIS — K838 Other specified diseases of biliary tract: Secondary | ICD-10-CM | POA: Insufficient documentation

## 2011-06-09 DIAGNOSIS — O99891 Other specified diseases and conditions complicating pregnancy: Secondary | ICD-10-CM | POA: Insufficient documentation

## 2011-06-09 DIAGNOSIS — R1011 Right upper quadrant pain: Secondary | ICD-10-CM | POA: Insufficient documentation

## 2011-06-09 HISTORY — DX: Encounter for supervision of normal pregnancy, unspecified, unspecified trimester: Z34.90

## 2011-06-09 LAB — CBC
HCT: 36.7 % (ref 36.0–46.0)
MCHC: 33.8 g/dL (ref 30.0–36.0)
MCV: 90.4 fL (ref 78.0–100.0)
Platelets: 249 10*3/uL (ref 150–400)
RDW: 14.4 % (ref 11.5–15.5)

## 2011-06-09 LAB — COMPREHENSIVE METABOLIC PANEL
AST: 20 U/L (ref 0–37)
BUN: 4 mg/dL — ABNORMAL LOW (ref 6–23)
CO2: 25 mEq/L (ref 19–32)
Chloride: 100 mEq/L (ref 96–112)
Creatinine, Ser: 0.45 mg/dL — ABNORMAL LOW (ref 0.50–1.10)
GFR calc Af Amer: >90 mL/min (ref >90)
GFR calc non Af Amer: >90 mL/min (ref >90)
Glucose, Bld: 90 mg/dL (ref 70–99)
Total Bilirubin: 0.3 mg/dL (ref 0.3–1.2)

## 2011-06-09 LAB — URINALYSIS, ROUTINE W REFLEX MICROSCOPIC
Glucose, UA: NEGATIVE mg/dL
Ketones, ur: >80 mg/dL — AB
Protein, ur: NEGATIVE mg/dL
Urobilinogen, UA: 0.2 mg/dL (ref 0.0–1.0)

## 2011-06-09 LAB — URINE MICROSCOPIC-ADD ON

## 2011-06-09 MED ORDER — SODIUM CHLORIDE 0.9 % IV BOLUS (SEPSIS)
250.0000 mL | Freq: Once | INTRAVENOUS | Status: AC
  Administered 2011-06-09: 250 mL via INTRAVENOUS

## 2011-06-09 MED ORDER — SODIUM CHLORIDE 0.9 % IV SOLN
INTRAVENOUS | Status: DC
  Administered 2011-06-09: 15:00:00 via INTRAVENOUS

## 2011-06-09 NOTE — ED Notes (Signed)
MD at bedside. 

## 2011-06-09 NOTE — ED Notes (Signed)
Pt returned to the hospital this morning for an outpatient ultrasound - results were positive.  Patient is being checked back into the ER for workup of +gall bladder.  Patient is pregnant.

## 2011-06-09 NOTE — ED Notes (Signed)
FHR 139 BPM.

## 2011-06-09 NOTE — ED Notes (Signed)
Discharge instructions reviewed with pt, questions answered. Pt verbalized understanding.  

## 2011-06-09 NOTE — ED Provider Notes (Signed)
History   This chart was scribed for Shelda Jakes, MD by Melba Coon. The patient was seen in room APA01/APA01 and the patient's care was started at 12:42PM.    CSN: [id number]  Arrival date & time 06/09/11  1034   First MD Initiated Contact with Patient 06/09/11 1123      Chief Complaint  Patient presents with  . Abdominal Pain    (Consider location/radiation/quality/duration/timing/severity/associated sxs/prior treatment) HPI Linda Pollard is a 21 y.o. female who presents to the Emergency Department complaining of constant, dull, non-radiating epigastric abdominal pain with an onset two days ago resulting from an ongoing pregnancy. Pt came into the hospital two days ago and was discharged, but since then has experienced abd pain, nausea, and vomit that has not disappeared. Pt is [redacted] wks pregnant (G1P0A0); due date is in 4 months (May). Pt also has SOB related to the abd pain and pregnancy. Physical exertion aggravates the pain. No HA, CP, diarrhea, dysuria, vaginal discharge, or vaginal bleeding.  Obstetrician: Dr. Emelda Fear    Past Medical History  Diagnosis Date  . Pregnancy with one fetus     History reviewed. No pertinent past surgical history.  No family history on file.  History  Substance Use Topics  . Smoking status: Never Smoker   . Smokeless tobacco: Never Used  . Alcohol Use: No    OB History    Grav Para Term Preterm Abortions TAB SAB Ect Mult Living   1               Review of Systems 10 Systems reviewed and are negative for acute change except as noted in the HPI.  Allergies  Review of patient's allergies indicates no known allergies.  Home Medications   Current Outpatient Rx  Name Route Sig Dispense Refill  . HYDROCODONE-ACETAMINOPHEN 5-500 MG PO TABS Oral Take 1-2 tablets by mouth every 6 (six) hours as needed for pain. 12 tablet 0  . NITROFURANTOIN MONOHYD MACRO 100 MG PO CAPS Oral Take 1 capsule (100 mg total) by mouth 2 (two) times  daily. 9 capsule 0  . PRENATAL 27-0.8 MG PO TABS Oral Take 1 tablet by mouth daily.        BP 129/65  Pulse 111  Temp(Src) 98.2 F (36.8 C) (Oral)  Resp 18  Ht 4\' 11"  (1.499 m)  Wt 146 lb (66.225 kg)  BMI 29.49 kg/m2  SpO2 100%  Physical Exam  Nursing note and vitals reviewed. Constitutional: She is oriented to person, place, and time. She appears well-developed and well-nourished. No distress.  HENT:  Head: Normocephalic and atraumatic.  Eyes: Conjunctivae and EOM are normal. Pupils are equal, round, and reactive to light.  Neck: Normal range of motion. Neck supple. No tracheal deviation present.  Cardiovascular: Normal rate, regular rhythm and normal heart sounds.  Exam reveals no gallop and no friction rub.   No murmur heard. Pulmonary/Chest: Effort normal and breath sounds normal. No respiratory distress. She has no wheezes. She has no rales.  Abdominal: Soft. Bowel sounds are normal. She exhibits no distension. There is no tenderness.  Musculoskeletal: Normal range of motion. She exhibits no edema.  Neurological: She is alert and oriented to person, place, and time. No sensory deficit.  Skin: Skin is warm and dry. No rash noted.  Psychiatric: She has a normal mood and affect. Her behavior is normal.    ED Course  Procedures (including critical care time)  DIAGNOSTIC STUDIES: Oxygen Saturation is 100% on room air,  normal by my interpretation.    COORDINATION OF CARE:  Results for orders placed during the hospital encounter of 06/09/11  COMPREHENSIVE METABOLIC PANEL      Component Value Range   Sodium 137  135 - 145 (mEq/L)   Potassium 3.5  3.5 - 5.1 (mEq/L)   Chloride 100  96 - 112 (mEq/L)   CO2 25  19 - 32 (mEq/L)   Glucose, Bld 90  70 - 99 (mg/dL)   BUN 4 (*) 6 - 23 (mg/dL)   Creatinine, Ser 4.09 (*) 0.50 - 1.10 (mg/dL)   Calcium 9.7  8.4 - 81.1 (mg/dL)   Total Protein 7.7  6.0 - 8.3 (g/dL)   Albumin 3.6  3.5 - 5.2 (g/dL)   AST 20  0 - 37 (U/L)   ALT 20  0 -  35 (U/L)   Alkaline Phosphatase 97  39 - 117 (U/L)   Total Bilirubin 0.3  0.3 - 1.2 (mg/dL)   GFR calc non Af Amer >90  >90 (mL/min)   GFR calc Af Amer >90  >90 (mL/min)  LIPASE, BLOOD      Component Value Range   Lipase 19  11 - 59 (U/L)  URINALYSIS, ROUTINE W REFLEX MICROSCOPIC      Component Value Range   Color, Urine YELLOW  YELLOW    APPearance CLEAR  CLEAR    Specific Gravity, Urine 1.020  1.005 - 1.030    pH 7.0  5.0 - 8.0    Glucose, UA NEGATIVE  NEGATIVE (mg/dL)   Hgb urine dipstick NEGATIVE  NEGATIVE    Bilirubin Urine NEGATIVE  NEGATIVE    Ketones, ur >80 (*) NEGATIVE (mg/dL)   Protein, ur NEGATIVE  NEGATIVE (mg/dL)   Urobilinogen, UA 0.2  0.0 - 1.0 (mg/dL)   Nitrite NEGATIVE  NEGATIVE    Leukocytes, UA SMALL (*) NEGATIVE   CBC      Component Value Range   WBC 13.8 (*) 4.0 - 10.5 (K/uL)   RBC 4.06  3.87 - 5.11 (MIL/uL)   Hemoglobin 12.4  12.0 - 15.0 (g/dL)   HCT 91.4  78.2 - 95.6 (%)   MCV 90.4  78.0 - 100.0 (fL)   MCH 30.5  26.0 - 34.0 (pg)   MCHC 33.8  30.0 - 36.0 (g/dL)   RDW 21.3  08.6 - 57.8 (%)   Platelets 249  150 - 400 (K/uL)  URINE MICROSCOPIC-ADD ON      Component Value Range   Squamous Epithelial / LPF MANY (*) RARE    WBC, UA 11-20  <3 (WBC/hpf)   RBC / HPF 0-2  <3 (RBC/hpf)   Bacteria, UA MANY (*) RARE        US Abdomen Complete  06/09/2011  *RADIOLOGY REPORT*  Clinical Data:  Right upper quadrant abdominal pain and epigastric pain. The patient is approximately 5 months pregnant.  COMPLETE ABDOMINAL ULTRASOUND 06/09/2011:  Comparison:  None.  Findings:  Gallbladder:  Multiple small shadowing gallstones.  Echogenic sludge.  Marked gallbladder wall thickening up to approximately 9 mm.  Negative sonographic Murphy's sign according to the ultrasound technologist.  Common bile duct:  Normal in caliber with maximum diameter approximating 5-6 mm.  Liver:  Normal size and echotexture without focal parenchymal abnormality.  Patent portal vein with  hepatopetal flow.  IVC:  Patent.  Pancreas:  Although the pancreas is difficult to visualize in its entirety, no focal pancreatic abnormality is identified.  The head was obscured by duodenal bowel gas.  Spleen:  Normal size and echotexture without focal parenchymal abnormality.  Right Kidney:  Mild right hydronephrosis.  Well-preserved cortex. Normal parenchymal echotexture.  No focal parenchymal abnormality. No shadowing calculi.  Approximately 12.0 cm in length.  Left Kidney:  No hydronephrosis.  Well-preserved cortex.  No shadowing calculi.  Normal size and parenchymal echotexture without focal abnormalities.  Approximately 10.7 cm in length.  Abdominal aorta:  Normal in caliber throughout its visualized course in the abdomen without significant atherosclerosis.  Other findings:  Fetal cardiac activity identified at a rate of 126 beats per minute.  IMPRESSION:  1.  Cholelithiasis, gallbladder sludge, and marked gallbladder wall thickening consistent with cholecystitis. 2.  Mild right hydronephrosis consistent with pregnancy. 3.  Otherwise normal abdominal ultrasound with a caveat that the pancreatic head was obscured by overlying bowel gas and was therefore not evaluated. 4.  Fetal cardiac activity identified at a rate of 126 beats per minute.  Original Report Authenticated By: Arnell Sieving, M.D.     1. Cholelithiasis       MDM   Patient is [redacted] weeks pregnant fetal heart tones here today were in the 140 range. Patient seen on New Year's Eve with concern for possible biliary colic. Return today for outpatient ultrasound with positive results for gallstones sludge mild gallbladder thickening consistent with cholecystitis. Patient has had some mild right upper quadrant epigastric discomfort since January 31, no nausea or vomiting since the 31 st. Initially was concerned for the possibility of significant cholecystitis, but clinically patient very nontoxic in no significant discomfort very very mild  tenderness to deep palpation over the gallbladder, as stated above no nausea or vomiting.   Discussed with Dr. Lovell Sheehan from general surgery and Dr. Emelda Fear the patient's OB/GYN. Both agree that patient go home has followup with Dr. Lovell Sheehan in the office tomorrow. The plan will be to arrange removal of the gallbladder sometime in the next 7 days will participate in the operation.      I personally performed the services described in this documentation, which was scribed in my presence. The recorded information has been reviewed and considered.        Shelda Jakes, MD 06/09/11 [phone]

## 2011-10-01 ENCOUNTER — Inpatient Hospital Stay (HOSPITAL_COMMUNITY)
Admission: EM | Admit: 2011-10-01 | Discharge: 2011-10-02 | Disposition: A | Discharge status: Home / Self Care | Payer: Medicaid Other | Attending: Obstetrics & Gynecology | Admitting: Obstetrics & Gynecology

## 2011-10-01 ENCOUNTER — Encounter (HOSPITAL_COMMUNITY): Payer: Self-pay

## 2011-10-01 DIAGNOSIS — R197 Diarrhea, unspecified: Secondary | ICD-10-CM | POA: Insufficient documentation

## 2011-10-01 DIAGNOSIS — R109 Unspecified abdominal pain: Secondary | ICD-10-CM | POA: Insufficient documentation

## 2011-10-01 DIAGNOSIS — O212 Late vomiting of pregnancy: Secondary | ICD-10-CM | POA: Insufficient documentation

## 2011-10-01 DIAGNOSIS — O479 False labor, unspecified: Secondary | ICD-10-CM

## 2011-10-01 DIAGNOSIS — O4700 False labor before 37 completed weeks of gestation, unspecified trimester: Secondary | ICD-10-CM | POA: Insufficient documentation

## 2011-10-01 LAB — POCT I-STAT, CHEM 8
Calcium, Ion: 1.12 mmol/L (ref 1.12–1.32)
Chloride: 104 mEq/L (ref 96–112)
Glucose, Bld: 103 mg/dL — ABNORMAL HIGH (ref 70–99)
HCT: 38 % (ref 36.0–46.0)
Hemoglobin: 12.9 g/dL (ref 12.0–15.0)

## 2011-10-01 MED ORDER — SODIUM CHLORIDE 0.9 % IV BOLUS (SEPSIS)
1000.0000 mL | Freq: Once | INTRAVENOUS | Status: AC
  Administered 2011-10-01: 1000 mL via INTRAVENOUS

## 2011-10-01 MED ORDER — SODIUM CHLORIDE 0.9 % IV SOLN
INTRAVENOUS | Status: DC
  Administered 2011-10-01: 23:00:00 via INTRAVENOUS

## 2011-10-01 NOTE — ED Notes (Signed)
Report given to [employer].

## 2011-10-01 NOTE — Progress Notes (Signed)
Monitoring requested by Linda Pollard, [facility].  Pt is [redacted]w[redacted]d, c/o abdom. Pain; no bleeding or leaking fluid.  Monitors applied @ 2149, fht 160bpm, UC q  1-1.80min

## 2011-10-01 NOTE — ED Notes (Signed)
Pt presents with abdominal pain and n/v/d since last night. Pt states pain is intermittent and abdomen gets "hard" and then goes away.

## 2011-10-01 NOTE — ED Provider Notes (Signed)
This chart was scribed for Ward Givens, MD by Williemae Natter. The patient was seen in room APOTF/OTF at 10:05 PM.  History     CSN: [id number]  Arrival date & time 10/01/11  2056   First MD Initiated Contact with Patient 10/01/11 2202      Chief Complaint  Patient presents with  . Abdominal Pain  . Nausea  . Emesis  . Diarrhea    (Consider location/radiation/quality/duration/timing/severity/associated sxs/prior treatment) Patient is a 21 y.o. female presenting with abdominal pain, vomiting, and diarrhea.  Abdominal Pain The primary symptoms of the illness include abdominal pain, nausea, vomiting and diarrhea. The onset of the illness was sudden. The problem has not changed since onset. Associated with: pregnancy contractions. The patient states that she believes she is currently pregnant. The patient has had a change in bowel habit. Symptoms associated with the illness do not include chills or constipation. Significant associated medical issues include gallstones.  Emesis  This is a recurrent problem. The current episode started yesterday. The problem occurs 2 to 4 times per day. The problem has not changed since onset.There has been no fever. Associated symptoms include abdominal pain and diarrhea. Pertinent negatives include no chills.  Diarrhea The primary symptoms include abdominal pain, nausea, vomiting and diarrhea. The illness began yesterday. The onset was sudden. The problem has not changed since onset. The illness does not include chills, constipation or itching. Significant associated medical issues include gallstones.   Linda Pollard is a 21 y.o. female  G1P0 Ab0, [facility] 10/26/11 is about [redacted] weeks pregnant presents to the Emergency Department complaining of acute onset abdominal pain since last night.She states the pains have been constant and hard. She does not describe contraction like pain that comes and goes.   .Pt is seen at [facility]. Pain started last night and has  been constant. Abdomen gotten  hard and has been hard most of the day since onset of pain. Nausea, vomiting x2, and 5 episodes of diarrhea since yesterday. States this evening she has felt like she was urinating on herself. She thought maybe her pain was from gallstones   Pt was diagnosed with gall stones 2 weeks ago. No complications with pregnancy.  OB care [facility]  Past Medical History  Diagnosis Date  . Pregnancy with one fetus     History reviewed. No pertinent past surgical history.  No family history on file.  History  Substance Use Topics  . Smoking status: Never Smoker   . Smokeless tobacco: Never Used  . Alcohol Use: No  unemployed  OB History    Grav Para Term Preterm Abortions TAB SAB Ect Mult Living   1               Review of Systems  Constitutional: Negative for chills.  Gastrointestinal: Positive for nausea, vomiting, abdominal pain and diarrhea. Negative for constipation.  Skin: Negative for itching.  All other systems reviewed and are negative.    Allergies  Review of patient's allergies indicates no known allergies.  Home Medications   Current Outpatient Rx  Name Route Sig Dispense Refill  . ACETAMINOPHEN 325 MG PO TABS Oral Take 325 mg by mouth once as needed. For pain    . OMEPRAZOLE 20 MG PO CPDR Oral Take 20 mg by mouth daily as needed. For acid reflux    . PRENATAL 27-0.8 MG PO TABS Oral Take 1 tablet by mouth at bedtime.       BP 118/72  Pulse  118  Temp(Src) 97.6 F (36.4 C) (Oral)  Resp 20  Ht 4\' 11"  (1.499 m)  Wt 144 lb (65.318 kg)  BMI 29.08 kg/m2  SpO2 100%  Vital signs normal    Physical Exam  Nursing note and vitals reviewed. Constitutional: She is oriented to person, place, and time. She appears well-developed and well-nourished. No distress.       Pt is [redacted] weeks pregnant   HENT:  Head: Normocephalic and atraumatic.  Right Ear: External ear normal.  Left Ear: External ear normal.  Nose: Nose normal.    Mouth/Throat: Oropharynx is clear and moist.  Eyes: Conjunctivae and EOM are normal. Pupils are equal, round, and reactive to light.  Neck: Normal range of motion. Neck supple.  Cardiovascular: Normal rate, regular rhythm, normal heart sounds and intact distal pulses.   Pulmonary/Chest: Effort normal and breath sounds normal. No respiratory distress. She has no wheezes. She has no rales. She exhibits no tenderness.  Abdominal: Bowel sounds are normal. There is tenderness. There is no rebound and no guarding.       Uterus c/w dates, she has diffuse abdominal pain, FHR in 160's  Genitourinary:       External genital swollen. Bimanual with sterile gloves, cx is short and soft, but closed. Nitrazine negative. Head easily palpated.   Musculoskeletal: Normal range of motion.  Neurological: She is alert and oriented to person, place, and time.  Skin: Skin is warm and dry.  Psychiatric: She has a normal mood and affect. Her behavior is normal.    ED Course  Procedures (including critical care time) DIAGNOSTIC STUDIES: Oxygen Saturation is 99% on room air, normal by my interpretation.    COORDINATION OF CARE:  Medications  0.9 %  sodium chloride infusion (  Intravenous New Bag/Given 10/01/11 2239)  sodium chloride 0.9 % bolus 1,000 mL (1000 mL Intravenous Given 10/01/11 2238)   22:19 Dr Penne Lash accepts in transfer to [facility]. States she is having contractions every minute on the fetal monitor.  Advised to give fluid bolus. Pt and husband advised of transfer. EMS called to transport patient.    Results for orders placed during the hospital encounter of 10/01/11  POCT I-STAT, CHEM 8      Component Value Range   Sodium 137  135 - 145 (mEq/L)   Potassium 3.6  3.5 - 5.1 (mEq/L)   Chloride 104  96 - 112 (mEq/L)   BUN 8  6 - 23 (mg/dL)   Creatinine, Ser 2.72  0.50 - 1.10 (mg/dL)   Glucose, Bld 536 (*) 70 - 99 (mg/dL)   Calcium, Ion 6.44  0.34 - 1.32 (mmol/L)   TCO2 22  0 - 100  (mmol/L)   Hemoglobin 12.9  12.0 - 15.0 (g/dL)   HCT 74.2  59.5 - 63.8 (%)   .  1. Abdominal pain   2. Vomiting and diarrhea   3. Preterm uterine contractions     Plan transfer to [facility]  Devoria Albe, MD, FACEP   MDM    I personally performed the services described in this documentation, which was scribed in my presence. The recorded information has been reviewed and considered. Devoria Albe, MD, Armando Gang        Ward Givens, MD 10/01/11 [phone]

## 2011-10-01 NOTE — Progress Notes (Signed)
Pt will transfer to [facility], via EMS, per Steward Drone RN @[facility] .  Dr. Adrian Blackwater aware, reviewed tracing.

## 2011-10-01 NOTE — ED Notes (Signed)
Lynden Ang,  RN from [employer] [facility] called to report pt having contractions 1 minute apart and baby looks normal on monitor

## 2011-10-01 NOTE — ED Notes (Signed)
Called and spoke to Milligan ([employer])  At [facility] and she will monitor pt and call with updates.

## 2011-10-02 ENCOUNTER — Inpatient Hospital Stay (HOSPITAL_COMMUNITY): Payer: Medicaid Other

## 2011-10-02 DIAGNOSIS — R109 Unspecified abdominal pain: Secondary | ICD-10-CM

## 2011-10-02 LAB — URINALYSIS, ROUTINE W REFLEX MICROSCOPIC
Bilirubin Urine: NEGATIVE
Ketones, ur: 15 mg/dL — AB
Nitrite: NEGATIVE
Protein, ur: NEGATIVE mg/dL
Urobilinogen, UA: 0.2 mg/dL (ref 0.0–1.0)

## 2011-10-02 LAB — WET PREP, GENITAL: Trich, Wet Prep: NONE SEEN

## 2011-10-02 MED ORDER — METRONIDAZOLE 500 MG PO TABS: 500.0000 mg | ORAL_TABLET | Freq: Two times a day (BID) | ORAL | Status: AC

## 2011-10-02 NOTE — MAU Provider Note (Signed)
  History     CSN: [id number]  Arrival date and time: 10/01/11 2056   First Provider Initiated Contact with Patient 10/02/11 0025      Chief Complaint  Patient presents with  . Abdominal Pain  . Nausea  . Emesis  . Diarrhea   HPI CC: Abdominal pain  Pt reports abdominal pain started lass night after dinner as gassy/crampy pain. Mild enough that pt able to sleep. Pt awoke to more severe pain that would last for seconds and then subside. Denies any vaginal discharge/gush or blood loss. Also complaining of n/v/d. Taking normal PO. Seen at [facility] No palliating or provoking factors.  No radiation to her pain.   OB History    Grav Para Term Preterm Abortions TAB SAB Ect Mult Living   1               Past Medical History  Diagnosis Date  . Pregnancy with one fetus     History reviewed. No pertinent past surgical history.  No family history on file.  History  Substance Use Topics  . Smoking status: Never Smoker   . Smokeless tobacco: Never Used  . Alcohol Use: No    Allergies: No Known Allergies   (Not in a hospital admission)  Review of Systems  Constitutional: Negative for fever and chills.  Eyes: Negative for blurred vision and double vision.  Respiratory: Negative for shortness of breath.   Cardiovascular: Negative for chest pain.  Gastrointestinal: Positive for nausea, vomiting, abdominal pain and diarrhea. Negative for heartburn, constipation and blood in stool.  Genitourinary: Negative for dysuria, urgency, frequency and hematuria.  Neurological: Negative for dizziness, sensory change and headaches.   Physical Exam   Blood pressure 107/62, pulse 107, temperature 98.9 F (37.2 C), temperature source Oral, resp. rate 18, height 4\' 11"  (1.499 m), weight 65.318 kg (144 lb), SpO2 100.00%.  Physical Exam  Constitutional: She appears well-developed and well-nourished.  HENT:  Head: Atraumatic.  Eyes: EOM are normal.  Neck: Normal range of  motion.  Cardiovascular: Normal rate and regular rhythm.   Respiratory: Effort normal.  GI: Soft. There is no rebound and no guarding.       Tenderness on palpation of mid abdomen between the umbilicus and pubic symphysis    Cervix: Closed thick and long  Negative for ferning  Wet prep showing Clue cells  MAU Course  Procedures  [place] to r/o rupture   Assessment and Plan  20yo [redacted]w[redacted]d G1P0 complaining of abdominal pain and n/v/d. No rupture on Korea.  Pain likely from contractions. No cervical change from evaluation at [facility]. Pt able to keep PO down w/ h/o limited emesis. We prep showed BV.  - Metro 500mg  BID for 7 days - Labor precautions discussed.   Discussed with Dr Adrian Blackwater.  MERRELL, DAVID 10/02/2011, 1:09 AM    Patient seen and examined.  Agree with above note.  Candelaria Celeste JEHIEL 10/02/2011 2:55 AM

## 2011-10-02 NOTE — Discharge Instructions (Signed)
Pregnancy - Third Trimester The third trimester begins at the 28th week of pregnancy and ends at birth. It is important to follow your doctor's instructions. HOME CARE   Go to your doctor's visits.   Do not smoke.   Do not drink alcohol or use drugs.   Only take medicine as told by your doctor.   Take prenatal vitamins as told. The vitamin should contain 1 milligram of folic acid.   Exercise.   Eat healthy foods. Eat regular, well-balanced meals.   You can have sex (intercourse) if there are no other problems with the pregnancy.   Do not use hot tubs, steam rooms, or saunas.   Wear a seat belt while driving.   Avoid raw meat, uncooked cheese, and litter boxes and soil used by cats.   Rest with your legs raised (elevated).   Make a list of emergency phone numbers. Keep this list with you.   Arrange for help when you come back home after delivering the baby.   Make a trial run to the hospital.   Take prenatal classes.   Prepare the baby's nursery.   Do not travel out of the city. If you absolutely have to, get permission from your doctor first.   Wear flat shoes. Do not wear high heels.  GET HELP RIGHT AWAY IF:   You have a temperature by mouth above 102 F (38.9 C), not controlled by medicine.   You have not felt the baby move for more than 1 hour. If you think the baby is not moving as much as normal, eat something with sugar in it or lie down on your left side for an hour. The baby should move at least 4 to 5 times per hour.   Fluid is coming from the vagina.   Blood is coming from the vagina. Light spotting is common, especially after sex (intercourse).   You have belly (abdominal) pain.   You have a bad smelling fluid (discharge) coming from the vagina. The fluid changes from clear to white.   You still feel sick to your stomach (nauseous).   You throw up (vomit) for more than 24 hours.   You have the chills.   You have shortness of breath.   You  have a burning feeling when you pee (urinate).   You lose or gain more than 2 pounds (0.9 kilograms) of weight over a week, or as told by your doctor.   Your face, hands, feet, or legs get puffy (swell).   You have a bad headache that will not go away.   You start to have problems seeing (blurry or double vision).   You fall, are in a car accident, or have any kind of trauma.   There is mental or physical violence at home.   You have any concerns or worries during your pregnancy.  MAKE SURE YOU:   Understand these instructions.   Will watch your condition.   Will get help right away if you are not doing well or get worse.  Document Released: 08/18/2009 Document Revised: 05/13/2011 Document Reviewed: 08/18/2009 [facility] Patient Information 2012 [place], [place].Bacterial Vaginosis Bacterial vaginosis is an infection of the vagina. A healthy vagina has many kinds of good germs (bacteria). Sometimes the number of good germs can change. This allows bad germs to move in and cause an infection. You may be given medicine (antibiotics) to treat the infection. Or, you may not need treatment at all. HOME CARE  Take your medicine  as told. Finish them even if you start to feel better.   Do not have sex until you finish your medicine.   Do not douche.   Practice safe sex.   Tell your sex partner that you have an infection. They should see their doctor for treatment if they have problems.  GET HELP RIGHT AWAY IF:  You do not get better after 3 days of treatment.   You have grey fluid (discharge) coming from your vagina.   You have pain.   You have a temperature of 102 F (38.9 C) or higher.  MAKE SURE YOU:   Understand these instructions.   Will watch your condition.   Will get help right away if you are not doing well or get worse.  Document Released: 03/02/2008 Document Revised: 05/13/2011 Document Reviewed: 03/02/2008 [facility] Patient Information 2012 [place], [place].

## 2011-10-04 LAB — OB RESULTS CONSOLE GBS: GBS: NEGATIVE

## 2011-10-04 LAB — GC/CHLAMYDIA PROBE AMP, GENITAL
Chlamydia, DNA Probe: NEGATIVE
GC Probe Amp, Genital: NEGATIVE

## 2011-10-30 ENCOUNTER — Encounter (HOSPITAL_COMMUNITY): Payer: Self-pay | Admitting: Anesthesiology

## 2011-10-30 ENCOUNTER — Encounter (HOSPITAL_COMMUNITY): Payer: Self-pay | Admitting: *Deleted

## 2011-10-30 ENCOUNTER — Inpatient Hospital Stay (HOSPITAL_COMMUNITY): Payer: Medicaid Other | Admitting: Anesthesiology

## 2011-10-30 ENCOUNTER — Inpatient Hospital Stay (HOSPITAL_COMMUNITY)
Admission: AD | Admit: 2011-10-30 | Discharge: 2011-11-01 | DRG: 775 | Disposition: A | Discharge status: Home / Self Care | Payer: Medicaid Other | Source: Ambulatory Visit | Attending: Obstetrics & Gynecology | Admitting: Obstetrics & Gynecology

## 2011-10-30 HISTORY — DX: Calculus of gallbladder without cholecystitis without obstruction: K80.20

## 2011-10-30 LAB — CBC
Hemoglobin: 12.4 g/dL (ref 12.0–15.0)
MCV: 91.4 fL (ref 78.0–100.0)
Platelets: 180 10*3/uL (ref 150–400)
RBC: 4.05 MIL/uL (ref 3.87–5.11)
WBC: 14.6 10*3/uL — ABNORMAL HIGH (ref 4.0–10.5)

## 2011-10-30 LAB — POCT FERN TEST: Fern Test: POSITIVE

## 2011-10-30 MED ORDER — PHENYLEPHRINE 40 MCG/ML (10ML) SYRINGE FOR IV PUSH (FOR BLOOD PRESSURE SUPPORT): 80.0000 ug | PREFILLED_SYRINGE | INTRAVENOUS | Status: DC | PRN

## 2011-10-30 MED ORDER — NALBUPHINE SYRINGE 5 MG/0.5 ML
5.0000 mg | INJECTION | INTRAMUSCULAR | Status: DC | PRN
  Administered 2011-10-30: 10 mg via INTRAVENOUS
  Administered 2011-10-30: 5 mg via INTRAVENOUS
  Filled 2011-10-30: qty 1
  Filled 2011-10-30: qty 0.5

## 2011-10-30 MED ORDER — EPHEDRINE 5 MG/ML INJ: 10.0000 mg | INTRAVENOUS | Status: DC | PRN

## 2011-10-30 MED ORDER — LACTATED RINGERS IV SOLN
500.0000 mL | INTRAVENOUS | Status: DC | PRN
  Administered 2011-10-30: 500 mL via INTRAVENOUS

## 2011-10-30 MED ORDER — OXYTOCIN 20 UNITS IN LACTATED RINGERS INFUSION - SIMPLE: 125.0000 mL/h | Freq: Once | INTRAVENOUS | Status: DC

## 2011-10-30 MED ORDER — LACTATED RINGERS IV SOLN: 500.0000 mL | Freq: Once | INTRAVENOUS | Status: DC

## 2011-10-30 MED ORDER — LACTATED RINGERS IV SOLN
INTRAVENOUS | Status: DC
  Administered 2011-10-30: 125 mL/h via INTRAVENOUS
  Administered 2011-10-30 – 2011-10-31 (×3): via INTRAVENOUS

## 2011-10-30 MED ORDER — IBUPROFEN 600 MG PO TABS
600.0000 mg | ORAL_TABLET | Freq: Four times a day (QID) | ORAL | Status: DC | PRN
  Filled 2011-10-30: qty 1

## 2011-10-30 MED ORDER — DIPHENHYDRAMINE HCL 50 MG/ML IJ SOLN: 12.5000 mg | INTRAMUSCULAR | Status: DC | PRN

## 2011-10-30 MED ORDER — FENTANYL 2.5 MCG/ML BUPIVACAINE 1/10 % EPIDURAL INFUSION (WH - ANES)
14.0000 mL/h | INTRAMUSCULAR | Status: DC
  Administered 2011-10-30 – 2011-10-31 (×3): 14 mL/h via EPIDURAL
  Filled 2011-10-30 (×3): qty 60

## 2011-10-30 MED ORDER — TERBUTALINE SULFATE 1 MG/ML IJ SOLN: 0.2500 mg | Freq: Once | INTRAMUSCULAR | Status: AC | PRN

## 2011-10-30 MED ORDER — FLEET ENEMA 7-19 GM/118ML RE ENEM: 1.0000 | ENEMA | RECTAL | Status: DC | PRN

## 2011-10-30 MED ORDER — OXYCODONE-ACETAMINOPHEN 5-325 MG PO TABS: 1.0000 | ORAL_TABLET | ORAL | Status: DC | PRN

## 2011-10-30 MED ORDER — CITRIC ACID-SODIUM CITRATE 334-500 MG/5ML PO SOLN
30.0000 mL | ORAL | Status: DC | PRN
  Filled 2011-10-30: qty 15

## 2011-10-30 MED ORDER — OXYTOCIN BOLUS FROM INFUSION
500.0000 mL | Freq: Once | INTRAVENOUS | Status: DC
  Filled 2011-10-30: qty 500
  Filled 2011-10-30: qty 1000

## 2011-10-30 MED ORDER — PHENYLEPHRINE 40 MCG/ML (10ML) SYRINGE FOR IV PUSH (FOR BLOOD PRESSURE SUPPORT)
80.0000 ug | PREFILLED_SYRINGE | INTRAVENOUS | Status: DC | PRN
  Filled 2011-10-30: qty 5

## 2011-10-30 MED ORDER — ONDANSETRON HCL 4 MG/2ML IJ SOLN: 4.0000 mg | Freq: Four times a day (QID) | INTRAMUSCULAR | Status: DC | PRN

## 2011-10-30 MED ORDER — ACETAMINOPHEN 325 MG PO TABS: 650.0000 mg | ORAL_TABLET | ORAL | Status: DC | PRN

## 2011-10-30 MED ORDER — LIDOCAINE HCL (PF) 1 % IJ SOLN: 30.0000 mL | INTRAMUSCULAR | Status: DC | PRN

## 2011-10-30 MED ORDER — EPHEDRINE 5 MG/ML INJ
10.0000 mg | INTRAVENOUS | Status: DC | PRN
  Filled 2011-10-30: qty 4

## 2011-10-30 MED ORDER — OXYTOCIN 20 UNITS IN LACTATED RINGERS INFUSION - SIMPLE
1.0000 m[IU]/min | INTRAVENOUS | Status: DC
  Administered 2011-10-30: 2 m[IU]/min via INTRAVENOUS

## 2011-10-30 MED ORDER — LIDOCAINE HCL (PF) 1 % IJ SOLN
INTRAMUSCULAR | Status: DC | PRN
  Administered 2011-10-30 (×3): 4 mL

## 2011-10-30 NOTE — Progress Notes (Signed)
To 170 via w/c 

## 2011-10-30 NOTE — MAU Note (Signed)
Dr Berline Chough called and in OR. Will be down to reck pt when finished in OR. Pt aware.

## 2011-10-30 NOTE — Anesthesia Procedure Notes (Signed)
Epidural Patient location during procedure: OB Start time: 10/30/2011 5:05 PM Reason for block: procedure for pain  Staffing Performed by: anesthesiologist   Preanesthetic Checklist Completed: patient identified, site marked, surgical consent, pre-op evaluation, timeout performed, IV checked, risks and benefits discussed and monitors and equipment checked  Epidural Patient position: sitting Prep: site prepped and draped and DuraPrep Patient monitoring: continuous pulse ox and blood pressure Approach: midline Injection technique: LOR air  Needle:  Needle type: Tuohy  Needle gauge: 17 G Needle length: 9 cm Needle insertion depth: 5 cm cm Catheter type: closed end flexible Catheter size: 19 Gauge Catheter at skin depth: 10 cm Test dose: negative  Assessment Events: blood not aspirated, injection not painful, no injection resistance, negative IV test and no paresthesia  Additional Notes Discussed risk of headache, infection, bleeding, nerve injury and failed or incomplete block.  Patient voices understanding and wishes to proceed.

## 2011-10-30 NOTE — Progress Notes (Signed)
This note also relates to the following rows which could not be included: SpO2 - Cannot attach notes to unvalidated device dat Pt pushes with a couple uc's, decide to let pt labor down for awhile.  Baby still 0 to +1 station.

## 2011-10-30 NOTE — Progress Notes (Signed)
Up to BR.

## 2011-10-30 NOTE — H&P (Addendum)
Linda Pollard is a 21 y.o. female presenting for SOL. Maternal Medical History:  Reason for admission: Reason for admission: rupture of membranes and contractions.  Reason for Admission:   nauseaContractions: Onset was 6-12 hours ago.   Frequency: regular.   Perceived severity is strong.    Fetal activity: Perceived fetal activity is normal.    Prenatal complications: No bleeding, cholelithiasis, HIV, hypertension, infection, IUGR, nephrolithiasis, oligohydramnios, placental abnormality, polyhydramnios, pre-eclampsia, preterm labor, substance abuse, thrombocytopenia or thrombophilia.     Practice  [facility]  Genetic Screen  sequential - negative  Anatomic Korea  LT Vent Ecogenic IntraCardiac Focus noted @ 20wks - reviewed @ 26 + 28 weeks  Glucose Screen  2o - 77/109/97  GC / Chlamydia  Neg  GBS  Neg   Feeding Preference  Breast  Contraception  OCP  Circumcision  female  Complications During Pregnancy - Asymptomatic Gallstones   OB History    Grav Para Term Preterm Abortions TAB SAB Ect Mult Living   1              Past Medical History  Diagnosis Date  . Pregnancy with one fetus   . Gallstone    Past Surgical History  Procedure Date  . No past surgeries    Family History: family history is negative for Anesthesia problems. Social History:  reports that she has never smoked. She has never used smokeless tobacco. She reports that she does not drink alcohol or use illicit drugs.  Review of Systems  Constitutional: Negative for fever and chills.  HENT: Negative.   Eyes: Negative.   Respiratory: Negative.   Cardiovascular: Negative.  Negative for leg swelling.  Gastrointestinal: Positive for abdominal pain. Negative for nausea and vomiting.  Genitourinary: Negative.   Musculoskeletal: Negative.   Skin: Negative.   Neurological: Negative.  Negative for headaches.  Endo/Heme/Allergies: Negative.   Psychiatric/Behavioral: Negative.     Dilation: 3 Effacement (%):  80 Station: -2 Exam by:: VKatrinka Blazing CNM Blood pressure 110/52, pulse 94, temperature 98 F (36.7 C), temperature source Oral, resp. rate 20, height 4\' 11"  (1.499 m), weight 71.396 kg (157 lb 6.4 oz). Maternal Exam:  Uterine Assessment: Contraction strength is moderate.  Contraction duration is 1 minute. Contraction frequency is regular.   Abdomen: Fundal height is consistent with dates.   Fetal presentation: vertex  Introitus: Normal vulva. Normal vagina.  Ferning test: positive.  Amniotic fluid character: not assessed.  Pelvis: adequate for delivery.   Cervix: Cervix evaluated by digital exam.     Fetal Exam Fetal Monitor Review: Baseline rate: 130.  Variability: moderate (6-25 bpm).   Pattern: accelerations present.    Fetal State Assessment: Category I - tracings are normal.    Dilation: 3 Effacement (%): 80 Cervical Position: Middle Station: -2 Presentation: Vertex Exam by:: Ivonne Andrew CNM  Physical Exam  Constitutional: She appears well-developed and well-nourished. No distress.  HENT:  Head: Normocephalic and atraumatic.  Eyes: Conjunctivae and EOM are normal. Right eye exhibits no discharge. Left eye exhibits no discharge. No scleral icterus.  Neck: No JVD present. No tracheal deviation present.  Cardiovascular: Normal rate, regular rhythm and intact distal pulses.   Respiratory: Effort normal. No stridor. No respiratory distress.  GI: Soft. She exhibits distension and mass. There is no tenderness. There is no rebound and no guarding.  Genitourinary: Vagina normal and uterus normal.  Neurological: She is alert.  Skin: Skin is warm and dry. She is not diaphoretic. No erythema.  Psychiatric: She  has a normal mood and affect. Her behavior is normal. Judgment and thought content normal.    Prenatal labs: ABO, Rh: O/Positive/-- (10/10 0000) Antibody:   negative Rubella: Immune (10/10 0000) RPR: Nonreactive (10/10 0000)  HBsAg: Negative (10/10 0000)  HIV:  Non-reactive (10/10 0000)  GBS: Negative (04/29 0000)   Assessment/Plan: + FERN Test with frequent contractions and cervical dilation.  Will admit to L&D with expectant management for Spontaneous onset of labor   Andrena Mews, DO [facility] Resident - PGY-1 10/30/2011 6:18 AM

## 2011-10-30 NOTE — Progress Notes (Signed)
[facility]

## 2011-10-30 NOTE — Progress Notes (Signed)
   Subjective: Pt reports comfortable with epidural.  No questions or concerns.  Feeling pressure with contractions.  Objective: BP 107/50  Pulse 90  Temp(Src) 98.6 F (37 C) (Oral)  Resp 16  Ht 4\' 11"  (1.499 m)  Wt 71.396 kg (157 lb 6.4 oz)  BMI 31.79 kg/m2  SpO2 100%   Total I/O In: -  Out: 350 [Urine:350]  FHT:  FHR: 120's bpm, variability: moderate,  accelerations:  Present,  decelerations:  Absent UC:   regular, every 2-3 minutes SVE:   Dilation: 8.5 Effacement (%): 100 Station: -1 Exam by:: B.Cagna,RN  Labs: Lab Results  Component Value Date   WBC 14.6* 10/30/2011   HGB 12.4 10/30/2011   HCT 37.0 10/30/2011   MCV 91.4 10/30/2011   PLT 180 10/30/2011    Assessment / Plan: Augmentation of labor, progressing well  Labor: Progressing normally Preeclampsia:  n/a Fetal Wellbeing:  Category I Pain Control:  Epidural I/D:  n/a Anticipated MOD:  NSVD  [facility] 10/30/2011, 9:27 PM

## 2011-10-30 NOTE — MAU Note (Signed)
"  I've been contracting since about 2100. Some pink mucosy d/c."

## 2011-10-30 NOTE — Progress Notes (Signed)
Pt up to Br 

## 2011-10-30 NOTE — Progress Notes (Signed)
V. Smith CNM in discussing plan of care with pt. 

## 2011-10-30 NOTE — MAU Provider Note (Addendum)
  History    CSN: [id number]  Arrival date and time: 10/30/11 0220  None  Chief Complaint  Patient presents with  . Contractions   HPI Comments: Pt presenting for Labor Evaluation.  States contractions began yesterday but increased in frequency and intensity and woke her from sleep 2 hours prior to arrival.  + Bloody show.  Was dilated to 2cm in office per patient   OB History    Grav Para Term Preterm Abortions TAB SAB Ect Mult Living   1               Past Medical History  Diagnosis Date  . Pregnancy with one fetus   . Gallstone     Past Surgical History  Procedure Date  . No past surgeries     Family History  Problem Relation Age of Onset  . Anesthesia problems Neg Hx     History  Substance Use Topics  . Smoking status: Never Smoker   . Smokeless tobacco: Never Used  . Alcohol Use: No    Allergies: No Known Allergies  Prescriptions prior to admission  Medication Sig Dispense Refill  . Prenatal Vit-Fe Fumarate-FA (MULTIVITAMIN-PRENATAL) 27-0.8 MG TABS Take 1 tablet by mouth at bedtime.         Review of Systems  Constitutional: Negative for fever and chills.  HENT: Negative.   Eyes: Negative.   Respiratory: Negative.  Negative for cough.   Cardiovascular: Negative.   Gastrointestinal: Positive for abdominal pain. Negative for nausea and vomiting.  Musculoskeletal: Negative.   Skin: Negative.   Neurological: Negative.   Endo/Heme/Allergies: Negative.   Psychiatric/Behavioral: Negative.    Physical Exam   Blood pressure 114/60, pulse 81, temperature 98.3 F (36.8 C), temperature source Oral, resp. rate 20, height 4\' 11"  (1.499 m), weight 71.396 kg (157 lb 6.4 oz).  Physical Exam  SEE H&P  MAU Course  Procedures  MDM Pt found to have positive FERN TEST - Will be admitted  Assessment and Plan  Admit to Labor and Delivery - see H&P  Tydus Sanmiguel 10/30/2011, 3:19 AM

## 2011-10-30 NOTE — MAU Note (Signed)
Dr Berline Chough on unit and aware of pt's admission and status.

## 2011-10-30 NOTE — Progress Notes (Signed)
Subjective: No complaints.  Growing more uncomfortable with ctx but talking through them.  No VB.  Objective: BP 98/64  Pulse 70  Temp(Src) 98.1 F (36.7 C) (Oral)  Resp 18  Ht 4\' 11"  (1.499 m)  Wt 71.396 kg (157 lb 6.4 oz)  BMI 31.79 kg/m2   FHT:  Baseline 120, moderate variability, +accels, no decels UC:   q8min SVE:  4/80/0 at 1315 from 3-4/80/-2 at 0930  Labs: RPR NR  Assessment / Plan: 21yo G1P0 @ 38'5 admitted with SROM and latent labor.  -SROM/labor: SROM, clear, around 0500.  Started pitocin for augmentation at 0945, currently at 4 milliunits/min.  -Pain control: Will continue IV meds prn until active labor fully established, at which point she'd like an epidural.  -FWB: vtx, GBS neg, cat 1 tracing.  -Plans breast and OCPs.  Female infant.  Chancy Hurter MD  10/30/2011, 1:10 PM

## 2011-10-30 NOTE — Progress Notes (Signed)
Subjective: No complaints.  Had gotten very uncomfortable but had epidural placed and no longer feeling ctx.  No VB.  Had another large gush of fluid.  Objective: BP 109/68  Pulse 83  Temp(Src) 98.1 F (36.7 C) (Oral)  Resp 18  Ht 4\' 11"  (1.499 m)  Wt 71.396 kg (157 lb 6.4 oz)  BMI 31.79 kg/m2  SpO2 100%   FHT:  Baseline 115 alternating with 125, moderate variability, +accels, no decels UC:   q42min SVE:  5/100/0 from  4/80/0 at 1315  Labs: No new labs.  Assessment / Plan: 21yo G1P0 @ 38'5 admitted with SROM and latent labor.  -SROM/labor: SROM, clear, around 0500.  Started pitocin for augmentation at 0945, currently at 6 milliunits/min with good ctx pattern and moderate cervical change.  Will attempt to limit cervical exams.  Recheck in 4 hrs.  -Pain control: comfortable with epidural.  -FWB: vtx, GBS neg, cat 1 tracing.  -Plans breast and OCPs.  Female infant.  Chancy Hurter MD  10/30/2011, 6:04 PM

## 2011-10-30 NOTE — Anesthesia Preprocedure Evaluation (Signed)
Anesthesia Evaluation  Patient identified by MRN, date of birth, ID band Patient awake    Reviewed: Allergy & Precautions, H&P , NPO status , Patient's Chart, lab work & pertinent test results, reviewed documented beta blocker date and time   History of Anesthesia Complications Negative for: history of anesthetic complications  Airway Mallampati: II TM Distance: >3 FB Neck ROM: full    Dental  (+) Teeth Intact   Pulmonary neg pulmonary ROS,  breath sounds clear to auscultation        Cardiovascular negative cardio ROS  Rhythm:regular Rate:Normal     Neuro/Psych negative neurological ROS  negative psych ROS   GI/Hepatic Neg liver ROS, GERD-  ,Gallstones diagnosed at 5 mos pregnant   Endo/Other  negative endocrine ROS  Renal/GU negative Renal ROS     Musculoskeletal   Abdominal   Peds  Hematology negative hematology ROS (+)   Anesthesia Other Findings   Reproductive/Obstetrics (+) Pregnancy                           Anesthesia Physical Anesthesia Plan  ASA: II  Anesthesia Plan: Epidural   Post-op Pain Management:    Induction:   Airway Management Planned:   Additional Equipment:   Intra-op Plan:   Post-operative Plan:   Informed Consent: I have reviewed the patients History and Physical, chart, labs and discussed the procedure including the risks, benefits and alternatives for the proposed anesthesia with the patient or authorized representative who has indicated his/her understanding and acceptance.     Plan Discussed with:   Anesthesia Plan Comments:         Anesthesia Quick Evaluation

## 2011-10-30 NOTE — MAU Provider Note (Signed)
I was present for the exam and agree with above.  [place], [place] 10/30/2011 6:39 AM

## 2011-10-30 NOTE — Progress Notes (Signed)
Subjective: No complaints.  Still somewhat uncomfortable with ctx but talking through them.  No VB.  Objective: BP 103/50  Pulse 75  Temp(Src) 98.3 F (36.8 C) (Oral)  Resp 18  Ht 4\' 11"  (1.499 m)  Wt 71.396 kg (157 lb 6.4 oz)  BMI 31.79 kg/m2     FHT:  Baseline 130, moderate variability, +accels, no decels UC:   Not always picking up well, but approx q28min SVE:  3-4/80/-2 around 0930 from 3/80/-2 around 0500 Labs: Lab Results  Component Value Date   WBC 14.6* 10/30/2011   HGB 12.4 10/30/2011   HCT 37.0 10/30/2011   MCV 91.4 10/30/2011   PLT 180 10/30/2011    Assessment / Plan: 21yo G1P0 @ 38'5 admitted with SROM and latent labor.  -SROM/labor: SROM, clear, around 0500.  Minimal cervical change over 4.5 hrs.  Will augment latent labor with pitocin now.  -Pain control: requests IV meds now.  Will try nubain.  Wants epidural once in active labor.  -FWB: vtx, GBS neg, cat 1 tracing.  -Plans breast and OCPs.  Chancy Hurter MD  10/30/2011, 9:30 AM

## 2011-10-31 ENCOUNTER — Encounter (HOSPITAL_COMMUNITY): Payer: Self-pay | Admitting: *Deleted

## 2011-10-31 MED ORDER — ACETAMINOPHEN 500 MG PO TABS
1000.0000 mg | ORAL_TABLET | Freq: Once | ORAL | Status: AC
  Administered 2011-10-31: 1000 mg via ORAL
  Filled 2011-10-31: qty 2

## 2011-10-31 MED ORDER — ZOLPIDEM TARTRATE 5 MG PO TABS: 5.0000 mg | ORAL_TABLET | Freq: Every evening | ORAL | Status: DC | PRN

## 2011-10-31 MED ORDER — DIBUCAINE 1 % RE OINT
1.0000 "application " | TOPICAL_OINTMENT | RECTAL | Status: DC | PRN
  Filled 2011-10-31: qty 28

## 2011-10-31 MED ORDER — DIPHENHYDRAMINE HCL 25 MG PO CAPS: 25.0000 mg | ORAL_CAPSULE | Freq: Four times a day (QID) | ORAL | Status: DC | PRN

## 2011-10-31 MED ORDER — WITCH HAZEL-GLYCERIN EX PADS: 1.0000 "application " | MEDICATED_PAD | CUTANEOUS | Status: DC | PRN

## 2011-10-31 MED ORDER — TETANUS-DIPHTH-ACELL PERTUSSIS 5-2.5-18.5 LF-MCG/0.5 IM SUSP
0.5000 mL | Freq: Once | INTRAMUSCULAR | Status: AC
  Administered 2011-11-01: 0.5 mL via INTRAMUSCULAR
  Filled 2011-10-31: qty 0.5

## 2011-10-31 MED ORDER — BENZOCAINE-MENTHOL 20-0.5 % EX AERO
1.0000 "application " | INHALATION_SPRAY | CUTANEOUS | Status: DC | PRN
  Filled 2011-10-31: qty 56

## 2011-10-31 MED ORDER — SENNOSIDES-DOCUSATE SODIUM 8.6-50 MG PO TABS
2.0000 | ORAL_TABLET | Freq: Every day | ORAL | Status: DC
  Administered 2011-10-31: 2 via ORAL

## 2011-10-31 MED ORDER — IBUPROFEN 600 MG PO TABS
600.0000 mg | ORAL_TABLET | Freq: Four times a day (QID) | ORAL | Status: DC
  Administered 2011-10-31 – 2011-11-01 (×4): 600 mg via ORAL
  Filled 2011-10-31 (×4): qty 1

## 2011-10-31 MED ORDER — LANOLIN HYDROUS EX OINT: TOPICAL_OINTMENT | CUTANEOUS | Status: DC | PRN

## 2011-10-31 MED ORDER — ONDANSETRON HCL 4 MG PO TABS: 4.0000 mg | ORAL_TABLET | ORAL | Status: DC | PRN

## 2011-10-31 MED ORDER — PRENATAL MULTIVITAMIN CH
1.0000 | ORAL_TABLET | Freq: Every day | ORAL | Status: DC
  Administered 2011-10-31 – 2011-11-01 (×2): 1 via ORAL
  Filled 2011-10-31 (×2): qty 1

## 2011-10-31 MED ORDER — SIMETHICONE 80 MG PO CHEW: 80.0000 mg | CHEWABLE_TABLET | ORAL | Status: DC | PRN

## 2011-10-31 MED ORDER — ONDANSETRON HCL 4 MG/2ML IJ SOLN: 4.0000 mg | INTRAMUSCULAR | Status: DC | PRN

## 2011-10-31 MED ORDER — METHYLERGONOVINE MALEATE 0.2 MG/ML IJ SOLN
INTRAMUSCULAR | Status: AC
  Administered 2011-10-31: 0.2 mg
  Filled 2011-10-31: qty 1

## 2011-10-31 MED ORDER — OXYCODONE-ACETAMINOPHEN 5-325 MG PO TABS: 1.0000 | ORAL_TABLET | ORAL | Status: DC | PRN

## 2011-10-31 NOTE — Progress Notes (Signed)
   Subjective: Pt reports feeling pressure with contractions.    Objective: BP 112/61  Pulse 81  Temp(Src) 98.5 F (36.9 C) (Oral)  Resp 18  Ht 4\' 11"  (1.499 m)  Wt 71.396 kg (157 lb 6.4 oz)  BMI 31.79 kg/m2  SpO2 99%   Total I/O In: -  Out: 350 [Urine:350]  FHT:  FHR: 110-120's bpm, variability: moderate,  accelerations:  Present,  decelerations:  Present variable UC:   regular, every 2-3 minutes SVE:   Dilation: 10 Effacement (%): 100 Station: +1 Exam by:: B.cagna,RN  Complete and pushing Labs: Lab Results  Component Value Date   WBC 14.6* 10/30/2011   HGB 12.4 10/30/2011   HCT 37.0 10/30/2011   MCV 91.4 10/30/2011   PLT 180 10/30/2011    Assessment / Plan: Augmentation of labor, progressing well  Labor: Progressing normally Preeclampsia:  n/a Fetal Wellbeing:  Category II Pain Control:  Epidural I/D:  n/a Anticipated MOD:  NSVD  [facility] 10/31/2011, 12:34 AM

## 2011-10-31 NOTE — Anesthesia Postprocedure Evaluation (Signed)
  Anesthesia Post-op Note  Patient: Linda Pollard  Procedure(s) Performed: * No procedures listed *  Patient Location: Mother/Baby  Anesthesia Type: Epidural  Level of Consciousness: awake, alert  and oriented  Airway and Oxygen Therapy: Patient Spontanous Breathing  Post-op Pain: mild  Post-op Assessment: Patient's Cardiovascular Status Stable, Respiratory Function Stable, Patent Airway, No signs of Nausea or vomiting and Pain level controlled  Post-op Vital Signs: stable  Complications: No apparent anesthesia complications

## 2011-10-31 NOTE — Progress Notes (Signed)
Transferred to mother/baby via wc.  Report given to mother/baby nurse.  Provider notified of pt's c/o sob and nausea.  Provider states she will go see pt.

## 2011-10-31 NOTE — H&P (Signed)
I was present for the exam and agree with above.  [place], [place] 10/31/2011 5:53 AM

## 2011-10-31 NOTE — Progress Notes (Signed)
Dr. Despina Hidden notified that pt has been pushing x 3 hours with progress, but slow; good fetal variability; however pt becoming tired; +2 station, +caput > Dr. Despina Hidden will come to assess and possibly due vacuum assisted delivery.

## 2011-10-31 NOTE — Progress Notes (Signed)
S:  Called by labor RN and notified that pt reports shortness of breath, no chest pain and vital signs are stable.  Upon pt eval, pt confirms no chest pain, however has difficulty getting a deep breath.  No other problems or concerns.  O:   Filed Vitals:   10/31/11 0655  BP: 103/68  Pulse: 54  Temp: 98 F (36.7 C)  Resp: 20  O2 Sat 98%  General:  Pt alert and oriented x 3; no signs of acute distress CVS:  RRR, without murmur, gallops, or rubs Lungs:  CTA bilat ABD:  Fundus firm  A:  Shortness of Breath - Exam Normal  P: Continue observation Consult with Dr. Marice Potter > no further action needed at this time [facility]

## 2011-11-01 MED ORDER — IBUPROFEN 600 MG PO TABS: 600.0000 mg | ORAL_TABLET | Freq: Four times a day (QID) | ORAL | Status: AC

## 2011-11-01 NOTE — Discharge Instructions (Signed)
Vaginal Delivery Care After  Change your pad on each trip to the bathroom.   Wipe gently with toilet paper during your hospital stay. Always wipe from front to back. A spray bottle with warm tap water could also be used or a towelette if available.   Place your soiled pad and toilet paper in a bathroom wastebasket with a plastic bag liner.   During your hospital stay, save any clots. If you pass a clot while on the toilet, do not flush it. Also, if your vaginal flow seems excessive to you, notify nursing personnel.   The first time you get out of bed after delivery, wait for assistance from a nurse. Do not get up alone at any time if you feel weak or dizzy.   Bend and extend your ankles forcefully so that you feel the calves of your legs get hard. Do this 6 times every hour when you are in bed and awake.   Do not sit with one foot under you, dangle your legs over the edge of the bed, or maintain a position that hinders the circulation in your legs.   Many women experience after pains for 2 to 3 days after delivery. These after pains are mild uterine contractions. Ask the nurse for a pain medication if you need something for this. Sometimes breastfeeding stimulates after pains; if you find this to be true, ask for the medication  -  hour before the next feeding.   For you and your infant's protection, do not go beyond the door(s) of the obstetric unit. Do not carry your baby in your arms in the hallway. When taking your baby to and from your room, put your baby in the bassinet and push the bassinet.   Mothers may have their babies in their room as much as they desire.  Document Released: 05/21/2000 Document Revised: 05/13/2011 Document Reviewed: 04/21/2007 [employer] Patient Information 2012 [employer]. 

## 2011-11-01 NOTE — Progress Notes (Signed)
UR chart review completed.  

## 2011-11-01 NOTE — Discharge Summary (Signed)
Obstetric Discharge Summary Reason for Admission: onset of labor Prenatal Procedures: none Intrapartum Procedures: vacuum assisted delivery Postpartum Procedures: none Complications-Operative and Postpartum: 2nd degree perineal laceration Hemoglobin  Date Value Range Status  10/30/2011 12.4  12.0-15.0 (g/dL) Final     HCT  Date Value Range Status  10/30/2011 37.0  36.0-46.0 (%) Final    Physical Exam:  General: alert, cooperative and no distress Lochia: appropriate Uterine Fundus: firm Incision: n/a DVT Evaluation: No evidence of DVT seen on physical exam. Negative Homan's sign. No cords or calf tenderness. No significant calf/ankle edema.  Discharge Diagnoses: Term Pregnancy-delivered  Discharge Information: Date: 11/01/2011 Activity: pelvic rest Diet: routine Medications: Ibuprofen Condition: stable Instructions: refer to practice specific booklet Discharge to: home Follow-up Information    Follow up with [facility] in 6 weeks.   Contact information:   [address] [place] [address]          Newborn Data: Live born female  Birth Weight: 8 lb 0.6 oz (3645 g) APGAR: 8, 9  Home with mother.   Andrena Mews, DO [facility] Resident - PGY-1 11/01/2011 8:28 AM

## 2011-11-05 NOTE — Progress Notes (Signed)
I examined pt and agree with documentation above and resident plan of care. Linda Pollard,[name]  

## 2011-11-09 NOTE — Progress Notes (Signed)
I examined pt and agree with documentation above and resident plan of care. [name],Linda Pollard  

## 2011-11-23 NOTE — H&P (Signed)
  NTS SOAP Note  Vital Signs:  Vitals as of: 06/15/2011: Systolic 137: Diastolic 73: Heart Rate 99: Temp 97.60F: Height 4ft 11in: Weight 143Lbs 0 Ounces: Pain Level 0: BMI 29  BMI : 28.88 kg/m2  Subjective: This 21 Years Female presents for of biliary colic.  Patient is two months postpartum.  Has known h/o cholelithiasis, having one episode of biliary colic during pregnancy.  Since delivery, she has had another episode of biliary colic.  No fever, chills, jaundice. Review of Symptoms:  Constitutional:unremarkable Head:unremarkable Eyes:unremarkable Nose/Mouth/Throat:unremarkable Cardiovascular:unremarkable Respiratory:unremarkable Gastrointestinal:see above Genitourinary:unremarkable Musculoskeletal:unremarkable Skin:unremarkable Hematolgic/Lymphatic:unremarkable Allergic/Immunologic:unremarkable   Past Medical History:Reviewed   Past Medical History  Surgical History: none Medical Problems: none Allergies: nkda Medications: none   Social History:Reviewed   Social History  Preferred Language: English ([place]) Ethnicity: Not Hispanic / Latino Age: 21 Years 2 Months Marital Status:  M Alcohol:  No Recreational drug(s):  No   Smoking Status: Never smoker reviewed on 06/15/2011  Family History:Reviewed   Family History  Is there a family history BJ:[id number]    Objective Information: General:Well appearing, well nourished in no distress. Head:Atraumatic; no masses; no abnormalities No scleral icterus Heart:RRR, no murmur or gallop.  Normal S1, S2.  No S3, S4.  Lungs:CTA bilaterally, no wheezes, rhonchi, rales.  Breathing unlabored. Abdomen:Soft, slightly tender in right upper quadrant to deep palpation.  ND, no HSM, no masses.    Assessment:Cholelithiasis, biliary colic  Diagnosis &amp; Procedure: DiagnosisCode: 574.20, ProcedureCode: 65784,    Plan:  Scheduled for laparoscopic  cholecystectomy on 12/03/11.  Follow-up: pending surgery

## 2011-11-26 ENCOUNTER — Encounter (HOSPITAL_COMMUNITY): Payer: Self-pay | Admitting: Pharmacy Technician

## 2011-11-26 NOTE — Patient Instructions (Addendum)
20 Linda Pollard  11/26/2011   Your procedure is scheduled on:   12/03/2011  Report to [facility] at  7:25  AM.  Call this number if you have problems the morning of surgery: [phone]   Remember:   Do not eat or drink:After Midnight.     Take these medicines the morning of surgery with A SIP OF WATER: No medications morning of surgery.   Do not wear jewelry, make-up or nail polish.  Do not wear lotions, powders, or perfumes. You may wear deodorant.  Do not shave 48 hours prior to surgery. Men may shave face and neck.  Do not bring valuables to the hospital.  Contacts, dentures or bridgework may not be worn into surgery.     Patients discharged the day of surgery will not be allowed to drive home.   Special Instructions: CHG Shower Use Special Wash: 1/2 bottle night before surgery and 1/2 bottle morning of surgery.   Please read over the following fact sheets that you were given: Pain Booklet, MRSA Information, Surgical Site Infection Prevention, Anesthesia Post-op Instructions and Care and Recovery After Surgery    Laparoscopic Cholecystectomy Laparoscopic cholecystectomy is surgery to remove the gallbladder. The gallbladder is located slightly to the right of center in the abdomen, behind the liver. It is a concentrating and storage sac for the bile produced in the liver. Bile aids in the digestion and absorption of fats. Gallbladder disease (cholecystitis) is an inflammation of your gallbladder. This condition is usually caused by a buildup of gallstones (cholelithiasis) in your gallbladder. Gallstones can block the flow of bile, resulting in inflammation and pain. In severe cases, emergency surgery may be required. When emergency surgery is not required, you will have time to prepare for the procedure. Laparoscopic surgery is an alternative to open surgery. Laparoscopic surgery usually has a shorter recovery time. Your common bile duct may also need to be examined and  explored. Your caregiver will discuss this with you if he or she feels this should be done. If stones are found in the common bile duct, they may be removed. LET YOUR CAREGIVER KNOW ABOUT:  Allergies to food or medicine.   Medicines taken, including vitamins, herbs, eyedrops, over-the-counter medicines, and creams.   Use of steroids (by mouth or creams).   Previous problems with anesthetics or numbing medicines.   History of bleeding problems or blood clots.   Previous surgery.   Other health problems, including diabetes and kidney problems.   Possibility of pregnancy, if this applies.  RISKS AND COMPLICATIONS All surgery is associated with risks. Some problems that may occur following this procedure include:  Infection.   Damage to the common bile duct, nerves, arteries, veins, or other internal organs such as the stomach or intestines.   Bleeding.   A stone may remain in the common bile duct.  BEFORE THE PROCEDURE  Do not take aspirin for 3 days prior to surgery or blood thinners for 1 week prior to surgery.   Do not eat or drink anything after midnight the night before surgery.   Let your caregiver know if you develop a cold or other infectious problem prior to surgery.   You should be present 60 minutes before the procedure or as directed.  PROCEDURE  You will be given medicine that makes you sleep (general anesthetic). When you are asleep, your surgeon will make several small cuts (incisions) in your abdomen. One of these incisions is used to insert a  small, lighted scope (laparoscope) into the abdomen. The laparoscope helps the surgeon see into your abdomen. Carbon dioxide gas will be pumped into your abdomen. The gas allows more room for the surgeon to perform your surgery. Other operating instruments are inserted through the other incisions. Laparoscopic procedures may not be appropriate when:  There is major scarring from previous surgery.   The gallbladder is  extremely inflamed.   There are bleeding disorders or unexpected cirrhosis of the liver.   A pregnancy is near term.   Other conditions make the laparoscopic procedure impossible.  If your surgeon feels it is not safe to continue with a laparoscopic procedure, he or she will perform an open abdominal procedure. In this case, the surgeon will make an incision to open the abdomen. This gives the surgeon a larger view and field to work within. This may allow the surgeon to perform procedures that sometimes cannot be performed with a laparoscope alone. Open surgery has a longer recovery time. AFTER THE PROCEDURE  You will be taken to the recovery area where a nurse will watch and check your progress.   You may be allowed to go home the same day.   Do not resume physical activities until directed by your caregiver.   You may resume a normal diet and activities as directed.  Document Released: 05/24/2005 Document Revised: 05/13/2011 Document Reviewed: 11/06/2010 [facility] Patient Information 2012 [place], [place].   PATIENT INSTRUCTIONS POST-ANESTHESIA  IMMEDIATELY FOLLOWING SURGERY:  Do not drive or operate machinery for the first twenty four hours after surgery.  Do not make any important decisions for twenty four hours after surgery or while taking narcotic pain medications or sedatives.  If you develop intractable nausea and vomiting or a severe headache please notify your doctor immediately.  FOLLOW-UP:  Please make an appointment with your surgeon as instructed. You do not need to follow up with anesthesia unless specifically instructed to do so.  WOUND CARE INSTRUCTIONS (if applicable):  Keep a dry clean dressing on the anesthesia/puncture wound site if there is drainage.  Once the wound has quit draining you may leave it open to air.  Generally you should leave the bandage intact for twenty four hours unless there is drainage.  If the epidural site drains for more than 36-48 hours please  call the anesthesia department.  QUESTIONS?:  Please feel free to call your physician or the hospital operator if you have any questions, and they will be happy to assist you.

## 2011-11-29 ENCOUNTER — Encounter (HOSPITAL_COMMUNITY)
Admission: RE | Admit: 2011-11-29 | Discharge: 2011-11-29 | Disposition: A | Discharge status: Home / Self Care | Payer: Medicaid Other | Source: Ambulatory Visit | Attending: General Surgery | Admitting: General Surgery

## 2011-11-29 ENCOUNTER — Encounter (HOSPITAL_COMMUNITY): Payer: Self-pay

## 2011-11-29 LAB — SURGICAL PCR SCREEN
MRSA, PCR: NEGATIVE
Staphylococcus aureus: NEGATIVE

## 2011-11-29 LAB — HEPATIC FUNCTION PANEL
Albumin: 4.1 g/dL (ref 3.5–5.2)
Alkaline Phosphatase: 91 U/L (ref 39–117)
Indirect Bilirubin: 0.2 mg/dL — ABNORMAL LOW (ref 0.3–0.9)
Total Bilirubin: 0.3 mg/dL (ref 0.3–1.2)
Total Protein: 7.5 g/dL (ref 6.0–8.3)

## 2011-11-29 LAB — BASIC METABOLIC PANEL
Calcium: 10.2 mg/dL (ref 8.4–10.5)
GFR calc non Af Amer: >90 mL/min (ref >90)
Glucose, Bld: 92 mg/dL (ref 70–99)
Potassium: 4.3 mEq/L (ref 3.5–5.1)
Sodium: 141 mEq/L (ref 135–145)

## 2011-11-29 LAB — DIFFERENTIAL
Lymphocytes Relative: 38 % (ref 12–46)
Lymphs Abs: 3 10*3/uL (ref 0.7–4.0)
Monocytes Relative: 7 % (ref 3–12)
Neutro Abs: 4.1 10*3/uL (ref 1.7–7.7)
Neutrophils Relative %: 53 % (ref 43–77)

## 2011-11-29 LAB — CBC
Hemoglobin: 13.3 g/dL (ref 12.0–15.0)
Platelets: 236 10*3/uL (ref 150–400)
RBC: 4.41 MIL/uL (ref 3.87–5.11)
WBC: 7.7 10*3/uL (ref 4.0–10.5)

## 2011-12-03 ENCOUNTER — Encounter (HOSPITAL_COMMUNITY): Payer: Self-pay | Admitting: *Deleted

## 2011-12-03 ENCOUNTER — Ambulatory Visit (HOSPITAL_COMMUNITY)
Admission: RE | Admit: 2011-12-03 | Discharge: 2011-12-03 | Disposition: A | Discharge status: Home / Self Care | Payer: Medicaid Other | Source: Ambulatory Visit | Attending: General Surgery | Admitting: General Surgery

## 2011-12-03 ENCOUNTER — Ambulatory Visit (HOSPITAL_COMMUNITY): Payer: Medicaid Other | Admitting: Anesthesiology

## 2011-12-03 ENCOUNTER — Encounter (HOSPITAL_COMMUNITY): Payer: Self-pay | Admitting: Anesthesiology

## 2011-12-03 ENCOUNTER — Encounter (HOSPITAL_COMMUNITY)
Admission: RE | Disposition: A | Discharge status: Home / Self Care | Payer: Self-pay | Source: Ambulatory Visit | Attending: General Surgery

## 2011-12-03 DIAGNOSIS — K8020 Calculus of gallbladder without cholecystitis without obstruction: Secondary | ICD-10-CM | POA: Insufficient documentation

## 2011-12-03 DIAGNOSIS — Z01812 Encounter for preprocedural laboratory examination: Secondary | ICD-10-CM | POA: Insufficient documentation

## 2011-12-03 HISTORY — PX: CHOLECYSTECTOMY: SHX55

## 2011-12-03 SURGERY — LAPAROSCOPIC CHOLECYSTECTOMY
Anesthesia: General | Site: Abdomen | Wound class: Clean Contaminated

## 2011-12-03 MED ORDER — LACTATED RINGERS IV SOLN
INTRAVENOUS | Status: DC | PRN
  Administered 2011-12-03: 08:00:00 via INTRAVENOUS

## 2011-12-03 MED ORDER — ROCURONIUM BROMIDE 50 MG/5ML IV SOLN
INTRAVENOUS | Status: AC
  Filled 2011-12-03: qty 1

## 2011-12-03 MED ORDER — BUPIVACAINE HCL (PF) 0.5 % IJ SOLN
INTRAMUSCULAR | Status: AC
  Filled 2011-12-03: qty 30

## 2011-12-03 MED ORDER — PROMETHAZINE HCL 25 MG/ML IJ SOLN
INTRAMUSCULAR | Status: AC
  Filled 2011-12-03: qty 1

## 2011-12-03 MED ORDER — LACTATED RINGERS IV SOLN
INTRAVENOUS | Status: DC
  Administered 2011-12-03: 1000 mL via INTRAVENOUS

## 2011-12-03 MED ORDER — CEFAZOLIN SODIUM 1-5 GM-% IV SOLN
INTRAVENOUS | Status: AC
  Filled 2011-12-03: qty 50

## 2011-12-03 MED ORDER — KETOROLAC TROMETHAMINE 30 MG/ML IJ SOLN
INTRAMUSCULAR | Status: AC
  Filled 2011-12-03: qty 1

## 2011-12-03 MED ORDER — FENTANYL CITRATE 0.05 MG/ML IJ SOLN
INTRAMUSCULAR | Status: AC
  Administered 2011-12-03: 50 ug via INTRAVENOUS
  Filled 2011-12-03: qty 2

## 2011-12-03 MED ORDER — ENOXAPARIN SODIUM 40 MG/0.4ML ~~LOC~~ SOLN
SUBCUTANEOUS | Status: AC
  Administered 2011-12-03: 40 mg via SUBCUTANEOUS
  Filled 2011-12-03: qty 0.4

## 2011-12-03 MED ORDER — ONDANSETRON HCL 4 MG/2ML IJ SOLN
4.0000 mg | Freq: Once | INTRAMUSCULAR | Status: AC
  Administered 2011-12-03: 4 mg via INTRAVENOUS

## 2011-12-03 MED ORDER — CEFAZOLIN SODIUM 1-5 GM-% IV SOLN
INTRAVENOUS | Status: DC | PRN
  Administered 2011-12-03: 1 g via INTRAVENOUS

## 2011-12-03 MED ORDER — MIDAZOLAM HCL 5 MG/5ML IJ SOLN
INTRAMUSCULAR | Status: DC | PRN
  Administered 2011-12-03: 2 mg via INTRAVENOUS

## 2011-12-03 MED ORDER — GLYCOPYRROLATE 0.2 MG/ML IJ SOLN
0.2000 mg | Freq: Once | INTRAMUSCULAR | Status: AC
  Administered 2011-12-03: 0.2 mg via INTRAVENOUS

## 2011-12-03 MED ORDER — SODIUM CHLORIDE 0.9 % IR SOLN
Status: DC | PRN
  Administered 2011-12-03: 1000 mL

## 2011-12-03 MED ORDER — GLYCOPYRROLATE 0.2 MG/ML IJ SOLN
INTRAMUSCULAR | Status: AC
  Filled 2011-12-03: qty 3

## 2011-12-03 MED ORDER — FENTANYL CITRATE 0.05 MG/ML IJ SOLN
INTRAMUSCULAR | Status: AC
  Administered 2011-12-03: 50 ug via INTRAVENOUS
  Filled 2011-12-03: qty 5

## 2011-12-03 MED ORDER — LIDOCAINE HCL (PF) 1 % IJ SOLN
INTRAMUSCULAR | Status: AC
  Filled 2011-12-03: qty 5

## 2011-12-03 MED ORDER — PROPOFOL 10 MG/ML IV EMUL
INTRAVENOUS | Status: AC
  Filled 2011-12-03: qty 20

## 2011-12-03 MED ORDER — CEFAZOLIN SODIUM 1-5 GM-% IV SOLN: 1.0000 g | INTRAVENOUS | Status: DC

## 2011-12-03 MED ORDER — PROPOFOL 10 MG/ML IV EMUL
INTRAVENOUS | Status: DC | PRN
  Administered 2011-12-03: 150 mg via INTRAVENOUS

## 2011-12-03 MED ORDER — ACETAMINOPHEN 325 MG PO TABS: 325.0000 mg | ORAL_TABLET | ORAL | Status: DC | PRN

## 2011-12-03 MED ORDER — ROCURONIUM BROMIDE 100 MG/10ML IV SOLN
INTRAVENOUS | Status: DC | PRN
  Administered 2011-12-03: 30 mg via INTRAVENOUS

## 2011-12-03 MED ORDER — ENOXAPARIN SODIUM 40 MG/0.4ML ~~LOC~~ SOLN
40.0000 mg | Freq: Once | SUBCUTANEOUS | Status: AC
  Administered 2011-12-03: 40 mg via SUBCUTANEOUS

## 2011-12-03 MED ORDER — BACTERIOSTATIC WATER(BENZ ALC) IJ SOLN
INTRAMUSCULAR | Status: AC
  Filled 2011-12-03: qty 30

## 2011-12-03 MED ORDER — HYDROCODONE-ACETAMINOPHEN 5-325 MG PO TABS: 1.0000 | ORAL_TABLET | ORAL | Status: AC | PRN

## 2011-12-03 MED ORDER — FENTANYL CITRATE 0.05 MG/ML IJ SOLN
INTRAMUSCULAR | Status: DC | PRN
  Administered 2011-12-03 (×2): 50 ug via INTRAVENOUS
  Administered 2011-12-03: 100 ug via INTRAVENOUS
  Administered 2011-12-03: 50 ug via INTRAVENOUS

## 2011-12-03 MED ORDER — ONDANSETRON HCL 4 MG/2ML IJ SOLN
INTRAMUSCULAR | Status: AC
  Administered 2011-12-03: 4 mg via INTRAVENOUS
  Filled 2011-12-03: qty 2

## 2011-12-03 MED ORDER — FENTANYL CITRATE 0.05 MG/ML IJ SOLN
INTRAMUSCULAR | Status: AC
  Filled 2011-12-03: qty 2

## 2011-12-03 MED ORDER — GLYCOPYRROLATE 0.2 MG/ML IJ SOLN
INTRAMUSCULAR | Status: AC
  Administered 2011-12-03: 0.2 mg via INTRAVENOUS
  Filled 2011-12-03: qty 1

## 2011-12-03 MED ORDER — PROMETHAZINE HCL 25 MG/ML IJ SOLN
12.5000 mg | Freq: Four times a day (QID) | INTRAMUSCULAR | Status: DC | PRN
  Administered 2011-12-03: 12:00:00 via INTRAVENOUS

## 2011-12-03 MED ORDER — ARTIFICIAL TEARS OP OINT
TOPICAL_OINTMENT | OPHTHALMIC | Status: AC
  Filled 2011-12-03: qty 3.5

## 2011-12-03 MED ORDER — BUPIVACAINE HCL 0.5 % IJ SOLN
INTRAMUSCULAR | Status: DC | PRN
  Administered 2011-12-03: 10 mL

## 2011-12-03 MED ORDER — KETOROLAC TROMETHAMINE 30 MG/ML IJ SOLN: 30.0000 mg | Freq: Once | INTRAMUSCULAR | Status: DC

## 2011-12-03 MED ORDER — EPHEDRINE SULFATE 50 MG/ML IJ SOLN
INTRAMUSCULAR | Status: AC
  Filled 2011-12-03: qty 1

## 2011-12-03 MED ORDER — FENTANYL CITRATE 0.05 MG/ML IJ SOLN
25.0000 ug | INTRAMUSCULAR | Status: DC | PRN
  Administered 2011-12-03 (×3): 50 ug via INTRAVENOUS

## 2011-12-03 MED ORDER — GLYCOPYRROLATE 0.2 MG/ML IJ SOLN
0.1000 mg | Freq: Once | INTRAMUSCULAR | Status: AC
  Administered 2011-12-03: 0.2 mg via INTRAVENOUS

## 2011-12-03 MED ORDER — MIDAZOLAM HCL 2 MG/2ML IJ SOLN
INTRAMUSCULAR | Status: AC
  Filled 2011-12-03: qty 2

## 2011-12-03 MED ORDER — ONDANSETRON HCL 4 MG/2ML IJ SOLN
4.0000 mg | Freq: Once | INTRAMUSCULAR | Status: AC | PRN
  Administered 2011-12-03: 4 mg via INTRAVENOUS

## 2011-12-03 MED ORDER — MIDAZOLAM HCL 2 MG/2ML IJ SOLN
INTRAMUSCULAR | Status: AC
  Administered 2011-12-03: 2 mg via INTRAVENOUS
  Filled 2011-12-03: qty 2

## 2011-12-03 MED ORDER — PHENYLEPHRINE HCL 10 MG/ML IJ SOLN
INTRAMUSCULAR | Status: AC
  Filled 2011-12-03: qty 1

## 2011-12-03 MED ORDER — MIDAZOLAM HCL 2 MG/2ML IJ SOLN
1.0000 mg | INTRAMUSCULAR | Status: DC | PRN
  Administered 2011-12-03: 2 mg via INTRAVENOUS

## 2011-12-03 MED ORDER — HEMOSTATIC AGENTS (NO CHARGE) OPTIME
TOPICAL | Status: DC | PRN
  Administered 2011-12-03: 1 via TOPICAL

## 2011-12-03 SURGICAL SUPPLY — 32 items
APPLIER CLIP ROT 10 11.4 M/L (STAPLE) ×2
BAG HAMPER (MISCELLANEOUS) ×2 IMPLANT
CLIP APPLIE ROT 10 11.4 M/L (STAPLE) ×1 IMPLANT
CLOTH BEACON ORANGE TIMEOUT ST (SAFETY) ×2 IMPLANT
COVER LIGHT HANDLE STERIS (MISCELLANEOUS) ×4 IMPLANT
DECANTER SPIKE VIAL GLASS SM (MISCELLANEOUS) ×2 IMPLANT
DURAPREP 26ML APPLICATOR (WOUND CARE) ×2 IMPLANT
ELECT REM PT RETURN 9FT ADLT (ELECTROSURGICAL) ×2
ELECTRODE REM PT RTRN 9FT ADLT (ELECTROSURGICAL) ×1 IMPLANT
FILTER SMOKE EVAC LAPAROSHD (FILTER) ×2 IMPLANT
FORMALIN 10 PREFIL 120ML (MISCELLANEOUS) ×2 IMPLANT
GLOVE BIO SURGEON STRL SZ7.5 (GLOVE) ×2 IMPLANT
GLOVE ECLIPSE 6.5 STRL STRAW (GLOVE) ×4 IMPLANT
GLOVE ECLIPSE 7.0 STRL STRAW (GLOVE) ×2 IMPLANT
GLOVE INDICATOR 7.0 STRL GRN (GLOVE) ×4 IMPLANT
GLOVE INDICATOR 7.5 STRL GRN (GLOVE) ×2 IMPLANT
GOWN STRL REIN XL XLG (GOWN DISPOSABLE) ×6 IMPLANT
INST SET LAPROSCOPIC AP (KITS) ×2 IMPLANT
KIT ROOM TURNOVER APOR (KITS) ×2 IMPLANT
KIT TROCAR LAP CHOLE (TROCAR) ×2 IMPLANT
MANIFOLD NEPTUNE II (INSTRUMENTS) ×2 IMPLANT
NS IRRIG 1000ML POUR BTL (IV SOLUTION) ×2 IMPLANT
PACK LAP CHOLE LZT030E (CUSTOM PROCEDURE TRAY) ×2 IMPLANT
PAD ARMBOARD 7.5X6 YLW CONV (MISCELLANEOUS) ×2 IMPLANT
POUCH SPECIMEN RETRIEVAL 10MM (ENDOMECHANICALS) ×2 IMPLANT
SET BASIN LINEN APH (SET/KITS/TRAYS/PACK) ×2 IMPLANT
SPONGE GAUZE 2X2 8PLY STRL LF (GAUZE/BANDAGES/DRESSINGS) ×8 IMPLANT
STAPLER VISISTAT (STAPLE) ×2 IMPLANT
SUT VICRYL 0 UR6 27IN ABS (SUTURE) ×2 IMPLANT
TAPE CLOTH SURG 4X10 WHT LF (GAUZE/BANDAGES/DRESSINGS) ×2 IMPLANT
WARMER LAPAROSCOPE (MISCELLANEOUS) ×2 IMPLANT
YANKAUER SUCT 12FT TUBE ARGYLE (SUCTIONS) ×2 IMPLANT

## 2011-12-03 NOTE — Transfer of Care (Signed)
Immediate Anesthesia Transfer of Care Note  Patient: Linda Pollard  Procedure(s) Performed: Procedure(s) (LRB): LAPAROSCOPIC CHOLECYSTECTOMY (N/A)  Patient Location: PACU  Anesthesia Type: General  Level of Consciousness: oriented, sedated and patient cooperative  Airway & Oxygen Therapy: Patient Spontanous Breathing and Patient connected to face mask oxygen  Post-op Assessment: Report given to PACU RN and Post -op Vital signs reviewed and stable  Post vital signs: Reviewed and stable  Complications: No apparent anesthesia complications

## 2011-12-03 NOTE — Anesthesia Postprocedure Evaluation (Signed)
  Anesthesia Post-op Note  Patient: Linda Pollard  Procedure(s) Performed: Procedure(s) (LRB): LAPAROSCOPIC CHOLECYSTECTOMY (N/A)  Patient Location: PACU  Anesthesia Type: General  Level of Consciousness: awake, alert , oriented and patient cooperative  Airway and Oxygen Therapy: Patient Spontanous Breathing and Patient connected to face mask oxygen  Post-op Pain: none  Post-op Assessment: Post-op Vital signs reviewed, Patient's Cardiovascular Status Stable, Respiratory Function Stable, Patent Airway and No signs of Nausea or vomiting  Post-op Vital Signs: Reviewed and stable  Complications: No apparent anesthesia complications

## 2011-12-03 NOTE — Preoperative (Signed)
Beta Blockers   Reason not to administer Beta Blockers:Not Applicable 

## 2011-12-03 NOTE — OR Nursing (Signed)
Nausea is much better. Patient wants to go home. Husband in room.

## 2011-12-03 NOTE — Anesthesia Preprocedure Evaluation (Signed)
Anesthesia Evaluation  Patient identified by MRN, date of birth, ID band Patient awake    Reviewed: Allergy & Precautions, H&P , NPO status , Patient's Chart, lab work & pertinent test results  Airway Mallampati: I TM Distance: >3 FB Neck ROM: Full    Dental No notable dental hx.    Pulmonary neg pulmonary ROS,    Pulmonary exam normal       Cardiovascular negative cardio ROS  Rhythm:Regular Rate:Normal     Neuro/Psych negative neurological ROS  negative psych ROS   GI/Hepatic negative GI ROS, Neg liver ROS,   Endo/Other  negative endocrine ROS  Renal/GU negative Renal ROS     Musculoskeletal negative musculoskeletal ROS (+)   Abdominal Normal abdominal exam  (+)   Peds  Hematology negative hematology ROS (+)   Anesthesia Other Findings   Reproductive/Obstetrics negative OB ROS                           Anesthesia Physical Anesthesia Plan  ASA: I  Anesthesia Plan: General   Post-op Pain Management:    Induction: Intravenous  Airway Management Planned: Oral ETT  Additional Equipment:   Intra-op Plan:   Post-operative Plan: Extubation in OR  Informed Consent: I have reviewed the patients History and Physical, chart, labs and discussed the procedure including the risks, benefits and alternatives for the proposed anesthesia with the patient or authorized representative who has indicated his/her understanding and acceptance.   Dental advisory given  Plan Discussed with: CRNA  Anesthesia Plan Comments:         Anesthesia Quick Evaluation

## 2011-12-03 NOTE — Op Note (Signed)
Patient:  Linda Pollard  DOB:  [birth year]  MRN:  [id number]   Preop Diagnosis:  Biliary colic, cholelithiasis, history of pancreatitis  Postop Diagnosis:  Same  Procedure:  Laparoscopic cholecystectomy  Surgeon:  Franky Macho, M.D.  Anes:  General endotracheal  Indications:  Patient is a 21 year old female who during her pregnancy had an episode of pancreatitis secondary to cholelithiasis. She was able to finish her pregnancy without incident. She did have an episode of biliary colic after delivering her baby, but she comes now to the operating room for lap scopic cholecystectomy. Risks and benefits of the procedure clean bleeding, infection, hepatobiliary injury, the possibly of an open procedure were fully explained to the patient, gave informed consent.  Procedure note:  Patient is placed the supine position. After induction of general endotracheal anesthesia, the abdomen was prepped and draped using usual sterile technique with DuraPrep. Surgical site confirmation was performed.  An infraumbilical incision was made down to the fascia. A Veress needle was introduced into the abdominal cavity and confirmation of placement was done using the saline drop test. The abdomen was then insufflated to 16 mm mercury pressure. An 11 mm trocar was introduced into the abdominal cavity under direct visualization without difficulty. The patient was placed in reverse Trendelenburg position and additional 11 mm trocar was placed the epigastric region and 5 mm trochars were placed in the right upper quadrant and right flank regions. The liver was inspected and noted within normal limits. The gallbladder was retracted superiorly and laterally. Dissection was begun around the infundibulum of the gallbladder. The cystic duct was first identified. Its juncture to the infundibulum was fully identified. Endoclips were placed proximally distally on the cystic duct and the cystic duct was divided. This is likewise  done to the cystic artery. The gallbladder was then freed away from the gallbladder fossa using Bovie electrocautery. The gallbladder was delivered through the epigastric trocar site using an Endo Catch bag. The gallbladder fossa was inspected no abnormal bleeding or bile leakage was noted. Surgicel is placed the gallbladder fossa. All fluid and air were then evacuated from the abdominal cavity prior to removal of the trochars.  All wounds were gave normal saline. All wounds were injected with 0.5% Sensorcaine. The infraumbilical fashion as well as epigastric fascia reapproximated using 0 Vicryl interrupted sutures. All skin incisions were closed using staples. Betadine ointment dry sterile dressings were applied.  All tape and needle counts were correct at the end of the procedure. The patient was extubated in the operating room and went back to recovery room awake in stable condition.  Complications:  None  EBL:  Minimal  Specimen:  None

## 2011-12-03 NOTE — Interval H&P Note (Signed)
History and Physical Interval Note:  12/03/2011 8:28 AM  Linda Pollard  has presented today for surgery, with the diagnosis of Cholelithiasis  The various methods of treatment have been discussed with the patient and family. After consideration of risks, benefits and other options for treatment, the patient has consented to  Procedure(s) (LRB): LAPAROSCOPIC CHOLECYSTECTOMY (N/A) as a surgical intervention .  The patient's history has been reviewed, patient examined, no change in status, stable for surgery.  I have reviewed the patients' chart and labs.  Questions were answered to the patient's satisfaction.     [name]

## 2011-12-03 NOTE — OR Nursing (Signed)
Patient c/o nausea. Orders obtained. And carried out.

## 2011-12-03 NOTE — Anesthesia Procedure Notes (Signed)
Procedure Name: Intubation Date/Time: 12/03/2011 9:12 AM Performed by: Carolyne Littles, Cella Cappello L Pre-anesthesia Checklist: Patient identified, Patient being monitored, Timeout performed, Emergency Drugs available and Suction available Patient Re-evaluated:Patient Re-evaluated prior to inductionOxygen Delivery Method: Circle System Utilized Preoxygenation: Pre-oxygenation with 100% oxygen Intubation Type: IV induction Ventilation: Mask ventilation without difficulty Laryngoscope Size: 3 and Miller Grade View: Grade I Tube type: Oral Tube size: 7.0 mm Number of attempts: 1 Airway Equipment and Method: stylet Placement Confirmation: ETT inserted through vocal cords under direct vision,  positive ETCO2 and breath sounds checked- equal and bilateral Secured at: 21 cm Tube secured with: Tape Dental Injury: Teeth and Oropharynx as per pre-operative assessment

## 2011-12-03 NOTE — Discharge Instructions (Signed)
Laparoscopic Cholecystectomy Care After Refer to this sheet in the next few weeks. These instructions provide you with information on caring for yourself after your procedure. Your caregiver may also give you more specific instructions. Your treatment has been planned according to current medical practices, but problems sometimes occur. Call your caregiver if you have any problems or questions after your procedure. HOME CARE INSTRUCTIONS   Change bandages (dressings) as directed by your caregiver.   Keep the wound dry and clean. The wound may be washed gently with soap and water. Gently blot or dab the area dry.   Do not take baths or use swimming pools or hot tubs for 10 days, or as instructed by your caregiver.   Only take over-the-counter or prescription medicines for pain, discomfort, or fever as directed by your caregiver.   Continue your normal diet as directed by your caregiver.   Do not lift anything heavier than 25 pounds (11.5 kg), or as directed by your caregiver.   Do not play contact sports for 1 week, or as directed by your caregiver.  SEEK MEDICAL CARE IF:   There is redness, swelling, or increasing pain in the wound.   You notice yellowish-white fluid (pus) coming from the wound.   There is drainage from the wound that lasts longer than 1 day.   There is a bad smell coming from the wound or dressing.   The surgical cut (incision) breaks open.  SEEK IMMEDIATE MEDICAL CARE IF:   You develop a rash.   You have difficulty breathing.   You develop chest pain.   You develop any reaction or side effects to medicines given.   You have a fever.   You have increasing pain in the shoulders (shoulder strap areas).   You have dizzy episodes or faint while standing.   You develop severe abdominal pain.   You feel sick to your stomach (nauseous) or throw up (vomit) and this lasts for more than 1 day.  MAKE SURE YOU:   Understand these instructions.   Will watch  your condition.   Will get help right away if you are not doing well or get worse.  Document Released: 05/24/2005 Document Revised: 05/13/2011 Document Reviewed: 11/06/2010 [employer] Patient Information 2012 [employer]. 

## 2011-12-06 ENCOUNTER — Encounter (HOSPITAL_COMMUNITY): Payer: Self-pay | Admitting: General Surgery

## 2011-12-07 ENCOUNTER — Encounter (HOSPITAL_COMMUNITY): Payer: Self-pay | Admitting: *Deleted

## 2011-12-07 ENCOUNTER — Emergency Department (HOSPITAL_COMMUNITY)
Admission: EM | Admit: 2011-12-07 | Discharge: 2011-12-08 | Disposition: A | Discharge status: Home / Self Care | Payer: Medicaid Other | Attending: Emergency Medicine | Admitting: Emergency Medicine

## 2011-12-07 DIAGNOSIS — R079 Chest pain, unspecified: Secondary | ICD-10-CM | POA: Insufficient documentation

## 2011-12-07 DIAGNOSIS — M549 Dorsalgia, unspecified: Secondary | ICD-10-CM

## 2011-12-07 DIAGNOSIS — Z9089 Acquired absence of other organs: Secondary | ICD-10-CM | POA: Insufficient documentation

## 2011-12-07 DIAGNOSIS — M25519 Pain in unspecified shoulder: Secondary | ICD-10-CM | POA: Insufficient documentation

## 2011-12-07 NOTE — ED Notes (Addendum)
Pt reports gallbladder removed on Friday. Reporting pain in right shoulder getting worse today. Reports increased pain with deep inspiration

## 2011-12-07 NOTE — ED Notes (Signed)
Into room to see patient. Resting sitting up in bed. Pain 7\10. States her right shoulder has been hurting. Strong right radial pulse with brisk cap refill. Sharp and aching, constant pain. No deformity to shoulder and denies injury. Breathing, standing, and movement of shoulder makes pain worse. States pain is 10\10 when any of these occur. Sitting down relieves pain somewhat. Clear lung sounds in all fields. No respiratory distress. Family with patient. Denies needs. Being placed in gown. Will continue to monitor.

## 2011-12-08 ENCOUNTER — Emergency Department (HOSPITAL_COMMUNITY): Payer: Medicaid Other

## 2011-12-08 NOTE — ED Notes (Addendum)
Ambulatory with steady gait to bathroom to obtain urine specimen via clean catch.

## 2011-12-08 NOTE — ED Notes (Signed)
MD at bedside to evaluate.

## 2011-12-08 NOTE — ED Notes (Signed)
MD at bedside to speak with patient about plan of care.

## 2011-12-08 NOTE — ED Notes (Signed)
Resting sitting up in bed. No distress. Equal chest rise and fall, regular, unlabored. Will continue to monitor. Family at bedside.

## 2011-12-08 NOTE — ED Provider Notes (Signed)
History     CSN: [id number]  Arrival date & time 12/07/11  2231   First MD Initiated Contact with Patient 12/08/11 0018      Chief Complaint  Patient presents with  . Shoulder Pain    (Consider location/radiation/quality/duration/timing/severity/associated sxs/prior treatment) HPI This is a 21 year old female who had her gallbladder taken out laparoscopically 5 days ago. Yesterday afternoon she developed pain in her right shoulder. Specifically the pain is located along the medial edge of the right scapula. It is worse with deep breathing, walking with her arm dangling naturally and relieved when her arm is lifted up. There is also tenderness on palpation. She denies abdominal pain except for soreness at the site of her laparoscopy incisions. She denies being short of breath. She has not taken any medication for this.  Past Medical History  Diagnosis Date  . Pregnancy with one fetus   . Gallstone     Past Surgical History  Procedure Date  . No past surgeries   . Cholecystectomy 12/03/2011    Procedure: LAPAROSCOPIC CHOLECYSTECTOMY;  Surgeon: Dalia Heading, MD;  Location: [facility];  Service: General;  Laterality: N/A;    Family History  Problem Relation Age of Onset  . Anesthesia problems Neg Hx     History  Substance Use Topics  . Smoking status: Never Smoker   . Smokeless tobacco: Never Used  . Alcohol Use: No    OB History    Grav Para Term Preterm Abortions TAB SAB Ect Mult Living   1 1 1       1       Review of Systems  All other systems reviewed and are negative.    Allergies  Review of patient's allergies indicates no known allergies.  Home Medications   Current Outpatient Rx  Name Route Sig Dispense Refill  . HYDROCODONE-ACETAMINOPHEN 5-325 MG PO TABS Oral Take 1-2 tablets by mouth every 4 (four) hours as needed for pain. 40 tablet 0  . IBUPROFEN 200 MG PO TABS Oral Take 200 mg by mouth every 6 (six) hours as needed. For pain    . PRENATAL 27-0.8 MG  PO TABS Oral Take 1 tablet by mouth at bedtime.       BP 106/59  Pulse 84  Temp 98.8 F (37.1 C)  Resp 16  Ht 4\' 11"  (1.499 m)  Wt 132 lb (59.875 kg)  BMI 26.66 kg/m2  SpO2 99%  Breastfeeding? Yes  Physical Exam General: Well-developed, well-nourished female in no acute distress; appearance consistent with age of record HENT: normocephalic, atraumatic Eyes: pupils equal round and reactive to light; extraocular muscles intact Neck: supple Heart: regular rate and rhythm Lungs: clear to auscultation bilaterally Abdomen: soft; nondistended; superficial tenderness at laparoscopy sites, staples in place, no signs of infection; no masses or hepatosplenomegaly; bowel sounds present Back: Tenderness of soft tissue along medial edge of right scapula without deformity or swelling Extremities: No deformity; full range of motion; pulses normal Neurologic: Awake, alert and oriented; motor function intact in all extremities and symmetric; no facial droop Skin: Warm and dry Psychiatric: Normal mood and affect    ED Course  Procedures (including critical care time)     MDM   Nursing notes and vitals signs, including pulse oximetry, reviewed.  Summary of this visit's results, reviewed by myself:  Labs:  Results for orders placed during the hospital encounter of 12/07/11  POCT PREGNANCY, URINE      Component Value Range   Preg Test, Ur  NEGATIVE  NEGATIVE    Imaging Studies: Dg Chest 2 View  12/08/2011  *RADIOLOGY REPORT*  Clinical Data: Left shoulder pain, left chest pain.  CHEST - 2 VIEW  Comparison: None  Findings: Heart and mediastinal contours are within normal limits. No focal opacities or effusions.  No acute bony abnormality.  IMPRESSION: No active cardiopulmonary disease.  Original Report Authenticated By: Cyndie Chime, M.D.            Hanley Seamen, MD 12/08/11 0110

## 2011-12-08 NOTE — ED Notes (Signed)
Resting sitting up in bed. Pain 7\10. Denies needs. No distress. Call bell within reach. Family with patient. Will continue to monitor.

## 2012-08-20 IMAGING — CR DG CHEST 2V
2 series · 2 of 2 positions shown · non-contrast
Comparison: None

CLINICAL DATA: Left shoulder pain, left chest pain.

CHEST - 2 VIEW

[view not recorded (1 of 2)]
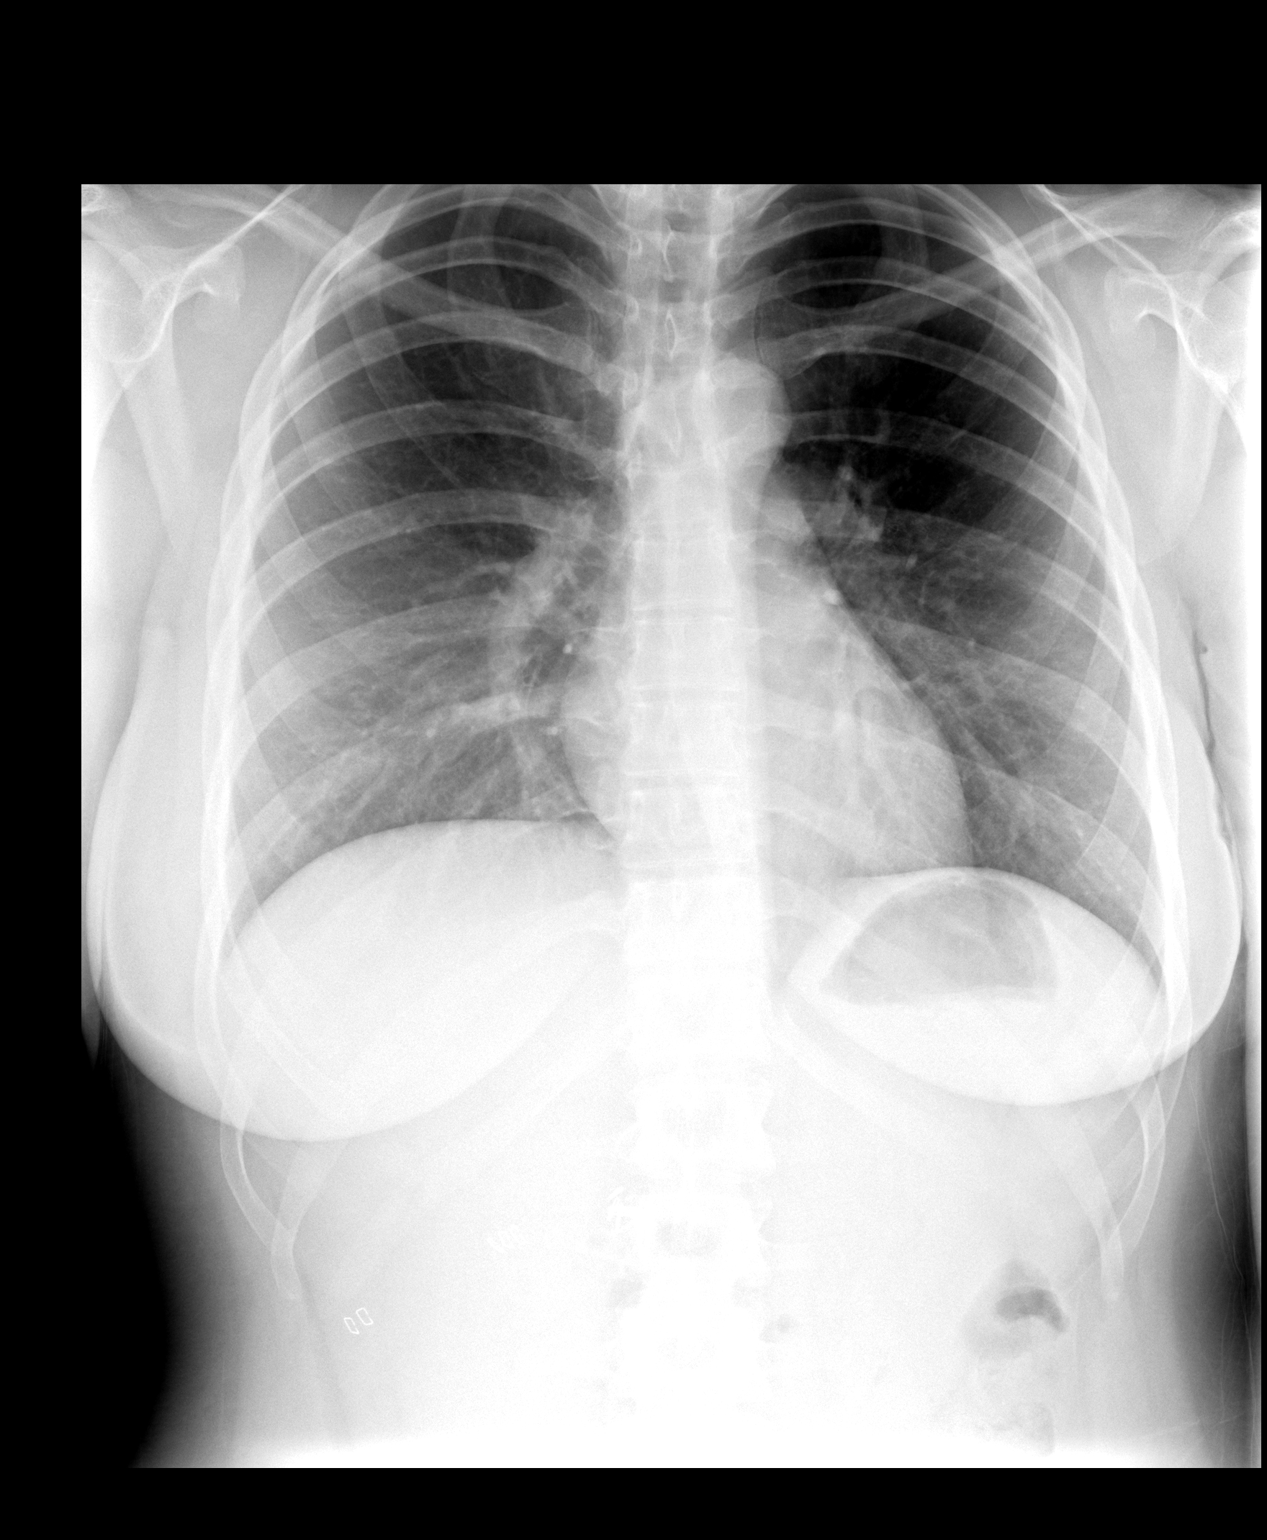

[view not recorded (2 of 2)]
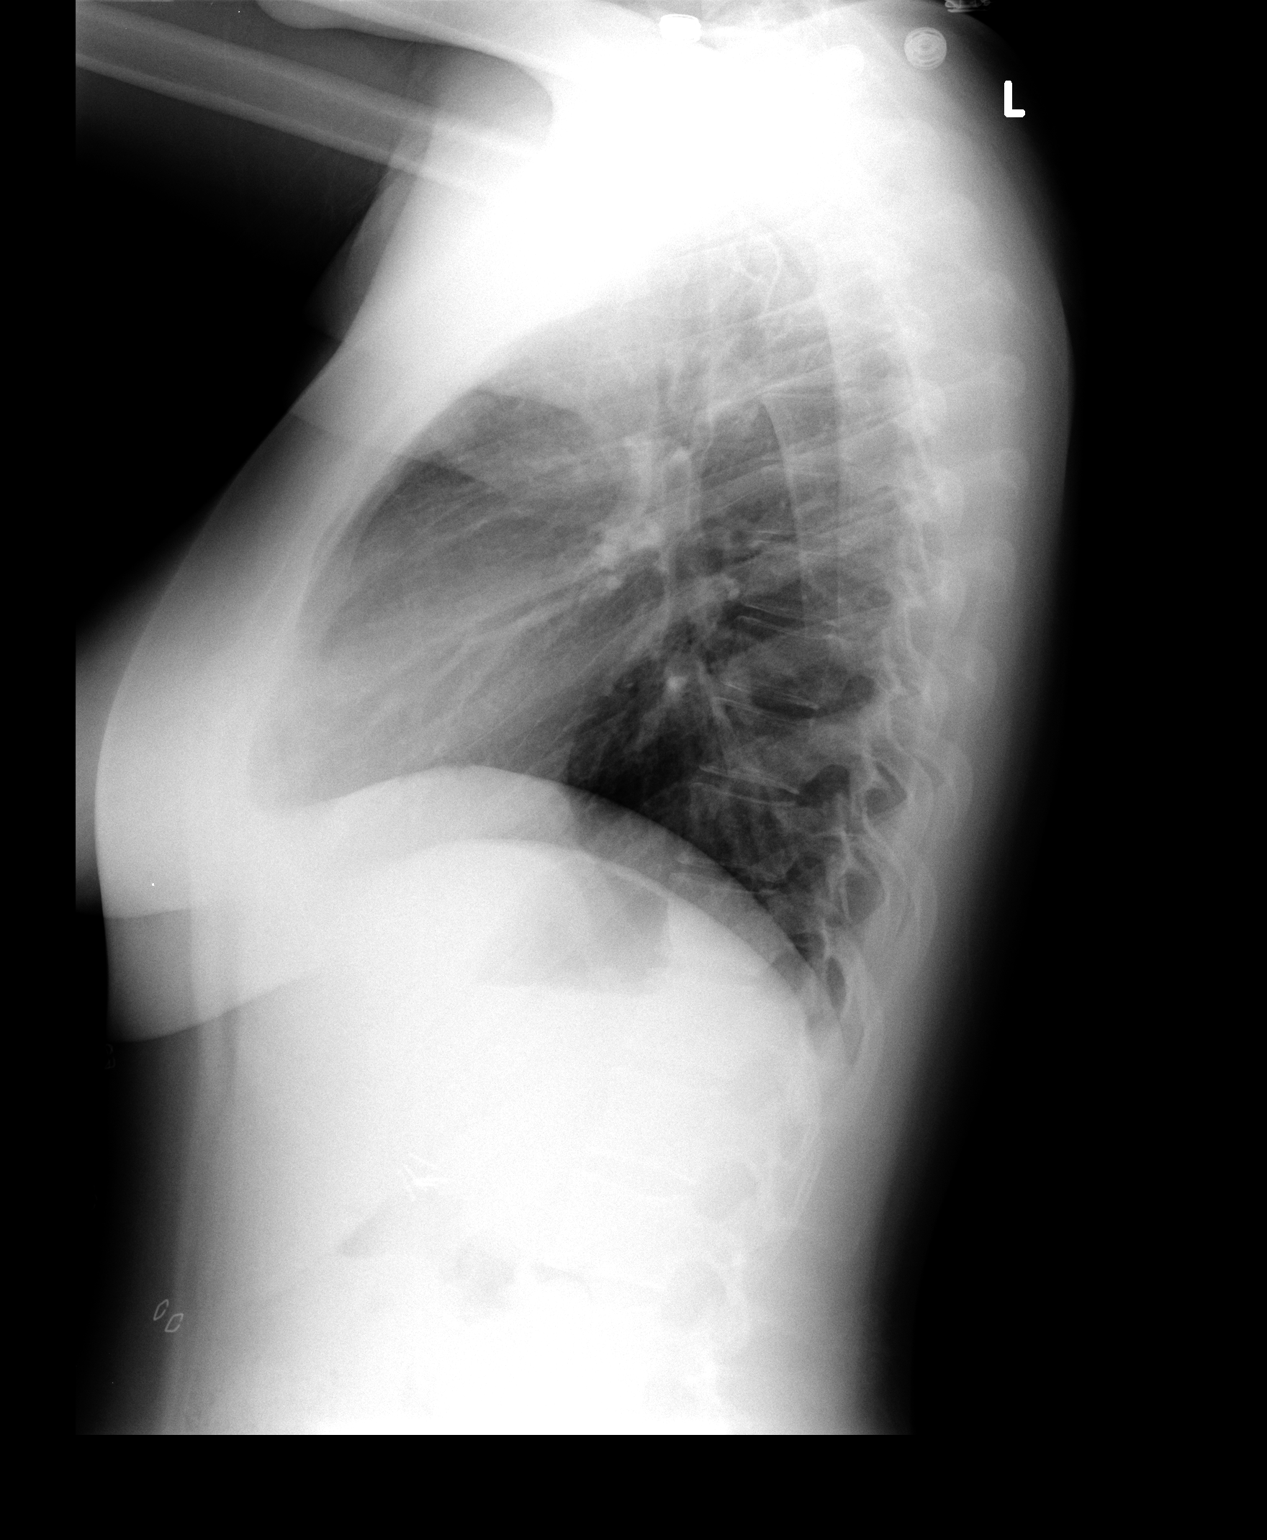

[2 of 2 positions shown; findings below may reference images not displayed]

FINDINGS: Heart and mediastinal contours are within normal limits.
No focal opacities or effusions.  No acute bony abnormality.
IMPRESSION: No active cardiopulmonary disease.

## 2013-04-16 ENCOUNTER — Encounter: Payer: Self-pay | Admitting: Adult Health

## 2013-04-16 ENCOUNTER — Encounter (INDEPENDENT_AMBULATORY_CARE_PROVIDER_SITE_OTHER): Payer: Self-pay

## 2013-04-16 ENCOUNTER — Ambulatory Visit (INDEPENDENT_AMBULATORY_CARE_PROVIDER_SITE_OTHER): Payer: Medicaid Other | Admitting: Adult Health

## 2013-04-16 VITALS — BP 120/70 | Ht 59.0 in | Wt 145.0 lb

## 2013-04-16 DIAGNOSIS — Z3200 Encounter for pregnancy test, result unknown: Secondary | ICD-10-CM

## 2013-04-16 DIAGNOSIS — Z3201 Encounter for pregnancy test, result positive: Secondary | ICD-10-CM

## 2013-04-16 LAB — POCT URINE PREGNANCY: Preg Test, Ur: POSITIVE

## 2013-04-16 NOTE — Progress Notes (Signed)
Patient ID: Linda Pollard, female   DOB: 1990/10/06, 22 y.o.   MRN: [id number] Pt here for UPT, UPT is positive, pt stated that she felt bloated and gassy. Pt given samples of PNV.

## 2013-04-25 ENCOUNTER — Other Ambulatory Visit: Payer: Self-pay | Admitting: Obstetrics & Gynecology

## 2013-04-25 DIAGNOSIS — O3680X0 Pregnancy with inconclusive fetal viability, not applicable or unspecified: Secondary | ICD-10-CM

## 2013-04-26 ENCOUNTER — Ambulatory Visit (INDEPENDENT_AMBULATORY_CARE_PROVIDER_SITE_OTHER): Payer: Medicaid Other

## 2013-04-26 ENCOUNTER — Encounter: Payer: Self-pay | Admitting: Obstetrics & Gynecology

## 2013-04-26 ENCOUNTER — Other Ambulatory Visit: Payer: Self-pay | Admitting: Obstetrics & Gynecology

## 2013-04-26 DIAGNOSIS — O26849 Uterine size-date discrepancy, unspecified trimester: Secondary | ICD-10-CM

## 2013-04-26 DIAGNOSIS — O3680X0 Pregnancy with inconclusive fetal viability, not applicable or unspecified: Secondary | ICD-10-CM

## 2013-04-26 NOTE — Progress Notes (Signed)
U/S-transabdominal u/s performed, single IUP with +FCA noted, FHR-126bpm, cx long and closed, retrofelxed uterus, bilateral adnexa WNL, no free fluid noted, CRL c/w 6+5 wks EDD 12/15/2013

## 2013-04-30 ENCOUNTER — Encounter: Payer: Self-pay | Admitting: Women's Health

## 2013-04-30 ENCOUNTER — Other Ambulatory Visit: Payer: Self-pay | Admitting: *Deleted

## 2013-04-30 ENCOUNTER — Other Ambulatory Visit (HOSPITAL_COMMUNITY)
Admission: RE | Admit: 2013-04-30 | Discharge: 2013-04-30 | Disposition: A | Discharge status: Home / Self Care | Payer: Medicaid Other | Source: Ambulatory Visit | Attending: Obstetrics & Gynecology | Admitting: Obstetrics & Gynecology

## 2013-04-30 ENCOUNTER — Ambulatory Visit (INDEPENDENT_AMBULATORY_CARE_PROVIDER_SITE_OTHER): Payer: Medicaid Other | Admitting: Women's Health

## 2013-04-30 VITALS — BP 108/56 | Wt 142.0 lb

## 2013-04-30 DIAGNOSIS — Z3481 Encounter for supervision of other normal pregnancy, first trimester: Secondary | ICD-10-CM

## 2013-04-30 DIAGNOSIS — Z01419 Encounter for gynecological examination (general) (routine) without abnormal findings: Secondary | ICD-10-CM

## 2013-04-30 DIAGNOSIS — Z331 Pregnant state, incidental: Secondary | ICD-10-CM

## 2013-04-30 DIAGNOSIS — Z1389 Encounter for screening for other disorder: Secondary | ICD-10-CM

## 2013-04-30 DIAGNOSIS — Z3480 Encounter for supervision of other normal pregnancy, unspecified trimester: Secondary | ICD-10-CM | POA: Insufficient documentation

## 2013-04-30 DIAGNOSIS — Z113 Encounter for screening for infections with a predominantly sexual mode of transmission: Secondary | ICD-10-CM | POA: Insufficient documentation

## 2013-04-30 DIAGNOSIS — Z23 Encounter for immunization: Secondary | ICD-10-CM

## 2013-04-30 LAB — CBC
Platelets: 244 10*3/uL (ref 150–400)
RBC: 4.2 MIL/uL (ref 3.87–5.11)
WBC: 8.5 10*3/uL (ref 4.0–10.5)

## 2013-04-30 LAB — POCT URINALYSIS DIPSTICK
Glucose, UA: NEGATIVE
Nitrite, UA: NEGATIVE

## 2013-04-30 MED ORDER — INFLUENZA VAC SPLIT QUAD 0.5 ML IM SUSP
0.5000 mL | Freq: Once | INTRAMUSCULAR | Status: AC
  Administered 2013-04-30: 0.5 mL via INTRAMUSCULAR

## 2013-04-30 MED ORDER — PRENATAL 27-0.8 MG PO TABS: 1.0000 | ORAL_TABLET | Freq: Every day | ORAL | Status: DC

## 2013-04-30 NOTE — Patient Instructions (Signed)
Nausea & Vomiting  Have saltine crackers or pretzels by your bed and eat a few bites before you raise your head out of bed in the morning  Eat small frequent meals throughout the day instead of large meals  Drink plenty of fluids throughout the day to stay hydrated, just don't drink a lot of fluids with your meals.  This can make your stomach fill up faster making you feel sick  Do not brush your teeth right after you eat  Products with real ginger are good for nausea, like ginger ale and ginger hard candy Make sure it says made with real ginger!  Sucking on sour candy like lemon heads is also good for nausea  If your prenatal vitamins make you nauseated, take them at night so you will sleep through the nausea  If you feel like you need medicine for the nausea & vomiting please let us know  If you are unable to keep any fluids or food down please let us know    Pregnancy - First Trimester During sexual intercourse, millions of sperm go into the vagina. Only 1 sperm will penetrate and fertilize the female egg while it is in the Fallopian tube. One week later, the fertilized egg implants into the wall of the uterus. An embryo begins to develop into a baby. At 6 to 8 weeks, the eyes and face are formed and the heartbeat can be seen on ultrasound. At the end of 12 weeks (first trimester), all the baby's organs are formed. Now that you are pregnant, you will want to do everything you can to have a healthy baby. Two of the most important things are to get good prenatal care and follow your caregiver's instructions. Prenatal care is all the medical care you receive before the baby's birth. It is given to prevent, find, and treat problems during the pregnancy and childbirth. PRENATAL EXAMS  During prenatal visits, your weight, blood pressure, and urine are checked. This is done to make sure you are healthy and progressing normally during the pregnancy.  A pregnant woman should gain 25 to 35 pounds  during the pregnancy. However, if you are overweight or underweight, your caregiver will advise you regarding your weight.  Your caregiver will ask and answer questions for you.  Blood work, cervical cultures, other necessary tests, and a Pap test are done during your prenatal exams. These tests are done to check on your health and the probable health of your baby. Tests are strongly recommended and done for HIV with your permission. This is the virus that causes AIDS. These tests are done because medicines can be given to help prevent your baby from being born with this infection should you have been infected without knowing it. Blood work is also used to find out your blood type, previous infections, and follow your blood levels (hemoglobin).  Low hemoglobin (anemia) is common during pregnancy. Iron and vitamins are given to help prevent this. Later in the pregnancy, blood tests for diabetes will be done along with any other tests if any problems develop.  You may need other tests to make sure you and the baby are doing well. CHANGES DURING THE FIRST TRIMESTER  Your body goes through many changes during pregnancy. They vary from person to person. Talk to your caregiver about changes you notice and are concerned about. Changes can include:  Your menstrual period stops.  The egg and sperm carry the genes that determine what you look like. Genes from you   and your partner are forming a baby. The female genes determine whether the baby is a boy or a girl.  Your body increases in girth and you may feel bloated.  Feeling sick to your stomach (nauseous) and throwing up (vomiting). If the vomiting is uncontrollable, call your caregiver.  Your breasts will begin to enlarge and become tender.  Your nipples may stick out more and become darker.  The need to urinate more. Painful urination may mean you have a bladder infection.  Tiring easily.  Loss of appetite.  Cravings for certain kinds of  food.  At first, you may gain or lose a couple of pounds.  You may have changes in your emotions from day to day (excited to be pregnant or concerned something may go wrong with the pregnancy and baby).  You may have more vivid and strange dreams. HOME CARE INSTRUCTIONS   It is very important to avoid all smoking, alcohol and non-prescribed drugs during your pregnancy. These affect the formation and growth of the baby. Avoid chemicals while pregnant to ensure the delivery of a healthy infant.  Start your prenatal visits by the 12th week of pregnancy. They are usually scheduled monthly at first, then more often in the last 2 months before delivery. Keep your caregiver's appointments. Follow your caregiver's instructions regarding medicine use, blood and lab tests, exercise, and diet.  During pregnancy, you are providing food for you and your baby. Eat regular, well-balanced meals. Choose foods such as meat, fish, milk and other low fat dairy products, vegetables, fruits, and whole-grain breads and cereals. Your caregiver will tell you of the ideal weight gain.  You can help morning sickness by keeping soda crackers at the bedside. Eat a couple before arising in the morning. You may want to use the crackers without salt on them.  Eating 4 to 5 small meals rather than 3 large meals a day also may help the nausea and vomiting.  Drinking liquids between meals instead of during meals also seems to help nausea and vomiting.  A physical sexual relationship may be continued throughout pregnancy if there are no other problems. Problems may be early (premature) leaking of amniotic fluid from the membranes, vaginal bleeding, or belly (abdominal) pain.  Exercise regularly if there are no restrictions. Check with your caregiver or physical therapist if you are unsure of the safety of some of your exercises. Greater weight gain will occur in the last 2 trimesters of pregnancy. Exercising will  help:  Control your weight.  Keep you in shape.  Prepare you for labor and delivery.  Help you lose your pregnancy weight after you deliver your baby.  Wear a good support or jogging bra for breast tenderness during pregnancy. This may help if worn during sleep too.  Ask when prenatal classes are available. Begin classes when they are offered.  Do not use hot tubs, steam rooms, or saunas.  Wear your seat belt when driving. This protects you and your baby if you are in an accident.  Avoid raw meat, uncooked cheese, cat litter boxes, and soil used by cats throughout the pregnancy. These carry germs that can cause birth defects in the baby.  The first trimester is a good time to visit your dentist for your dental health. Getting your teeth cleaned is okay. Use a softer toothbrush and brush gently during pregnancy.  Ask for help if you have financial, counseling, or nutritional needs during pregnancy. Your caregiver will be able to offer counseling for   these needs as well as refer you for other special needs.  Do not take any medicines or herbs unless told by your caregiver.  Inform your caregiver if there is any mental or physical domestic violence.  Make a list of emergency phone numbers of family, friends, hospital, and police and fire departments.  Write down your questions. Take them to your prenatal visit.  Do not douche.  Do not cross your legs.  If you have to stand for long periods of time, rotate you feet or take small steps in a circle.  You may have more vaginal secretions that may require a sanitary pad. Do not use tampons or scented sanitary pads. MEDICINES AND DRUG USE IN PREGNANCY  Take prenatal vitamins as directed. The vitamin should contain 1 milligram of folic acid. Keep all vitamins out of reach of children. Only a couple vitamins or tablets containing iron may be fatal to a baby or young child when ingested.  Avoid use of all medicines, including herbs,  over-the-counter medicines, not prescribed or suggested by your caregiver. Only take over-the-counter or prescription medicines for pain, discomfort, or fever as directed by your caregiver. Do not use aspirin, ibuprofen, or naproxen unless directed by your caregiver.  Let your caregiver also know about herbs you may be using.  Alcohol is related to a number of birth defects. This includes fetal alcohol syndrome. All alcohol, in any form, should be avoided completely. Smoking will cause low birth rate and premature babies.  Street or illegal drugs are very harmful to the baby. They are absolutely forbidden. A baby born to an addicted mother will be addicted at birth. The baby will go through the same withdrawal an adult does.  Let your caregiver know about any medicines that you have to take and for what reason you take them. SEEK MEDICAL CARE IF:  You have any concerns or worries during your pregnancy. It is better to call with your questions if you feel they cannot wait, rather than worry about them. SEEK IMMEDIATE MEDICAL CARE IF:   An unexplained oral temperature above 102 F (38.9 C) develops, or as your caregiver suggests.  You have leaking of fluid from the vagina (birth canal). If leaking membranes are suspected, take your temperature and inform your caregiver of this when you call.  There is vaginal spotting or bleeding. Notify your caregiver of the amount and how many pads are used.  You develop a bad smelling vaginal discharge with a change in the color.  You continue to feel sick to your stomach (nauseated) and have no relief from remedies suggested. You vomit blood or coffee ground-like materials.  You lose more than 2 pounds of weight in 1 week.  You gain more than 2 pounds of weight in 1 week and you notice swelling of your face, hands, feet, or legs.  You gain 5 pounds or more in 1 week (even if you do not have swelling of your hands, face, legs, or feet).  You get  exposed to German measles and have never had them.  You are exposed to fifth disease or chickenpox.  You develop belly (abdominal) pain. Round ligament discomfort is a common non-cancerous (benign) cause of abdominal pain in pregnancy. Your caregiver still must evaluate this.  You develop headache, fever, diarrhea, pain with urination, or shortness of breath.  You fall or are in a car accident or have any kind of trauma.  There is mental or physical violence in your home. Document   Released: 05/18/2001 Document Revised: 02/16/2012 Document Reviewed: 11/19/2008 [employer] Patient Information 2014 [employer].  

## 2013-04-30 NOTE — Progress Notes (Signed)
  Subjective:    Linda Pollard is a 21 y.o. G65P1001 Hispanic female at [redacted]w[redacted]d by LMP which correlates w/in 7d of 6wk u/s, being seen today for her first obstetrical visit.  Her obstetrical history is significant for term uncomplicated SVD ago on 10/31/11. Feels like she had some PPD x 1-2wks pp. Had cholecystectomy 12/03/11. Pregnancy history fully reviewed.   Patient reports nausea has mainly gone away for now. Denies cramping, vb, uti s/s, no vomiting.   Filed Vitals:   04/30/13 1424  BP: 108/56  Weight: 142 lb (64.411 kg)    HISTORY: OB History  Gravida Para Term Preterm AB SAB TAB Ectopic Multiple Living  2 1 1       1     # Outcome Date GA Lbr Len/2nd Weight Sex Delivery Anes PTL Lv  2 CUR           1 TRM 10/31/11 [redacted]w[redacted]d 22:02 / 05:40 8 lb 0.6 oz (3.645 kg) F SVD EPI  Y     Past Medical History  Diagnosis Date  . Pregnancy with one fetus   . Gallstone    Past Surgical History  Procedure Laterality Date  . No past surgeries    . Cholecystectomy  12/03/2011    Procedure: LAPAROSCOPIC CHOLECYSTECTOMY;  Surgeon: Dalia Heading, MD;  Location: [facility];  Service: General;  Laterality: N/A;   Family History  Problem Relation Age of Onset  . Anesthesia problems Neg Hx      Exam    Pelvic Exam:    Perineum: Normal Perineum   Vulva: normal   Vagina:  normal mucosa, normal discharge, no palpable nodules   Uterus   normal size/shape/contour for GA     Cervix: normal   Adnexa: Not palpable   Urinary: urethral meatus normal    System:     Skin: normal coloration and turgor, no rashes    Neurologic: oriented, normal mood   Extremities: normal strength, tone, and muscle mass   HEENT PERRLA   Mouth/Teeth mucous membranes moist   Cardiovascular: regular rate and rhythm   Respiratory:  appears well, vitals normal, no respiratory distress, acyanotic, normal RR   Abdomen: soft, non-tender    Thin prep pap smear done     Assessment:    Pregnancy: G2P1001 Patient  Active Problem List   Diagnosis Date Noted  . Supervision of other normal pregnancy 04/30/2013    Priority: High      [redacted]w[redacted]d G2P1001 New OB visit    Plan:     Initial labs drawn Continue prenatal vitamins Problem list reviewed and updated Reviewed n/v relief measures and warning s/s to report Reviewed recommended weight gain based on pre-gravid BMI Encouraged well-balanced diet Genetic Screening discussed Integrated Screen: requested Cystic fibrosis screening discussed requested Ultrasound discussed; fetal survey: requested Follow up in 4 weeks for 1st it/nt and visit  Marge Duncans 04/30/2013 3:15 PM

## 2013-05-01 ENCOUNTER — Encounter: Payer: Self-pay | Admitting: Women's Health

## 2013-05-01 LAB — URINALYSIS, ROUTINE W REFLEX MICROSCOPIC
Bilirubin Urine: NEGATIVE
Glucose, UA: NEGATIVE mg/dL
Protein, ur: NEGATIVE mg/dL
Specific Gravity, Urine: 1.026 (ref 1.005–1.030)
Urobilinogen, UA: 1 mg/dL (ref 0.0–1.0)

## 2013-05-01 LAB — DRUG SCREEN, URINE, NO CONFIRMATION
Amphetamine Screen, Ur: NEGATIVE
Marijuana Metabolite: NEGATIVE
Methadone: NEGATIVE
Opiate Screen, Urine: NEGATIVE
Propoxyphene: NEGATIVE

## 2013-05-01 LAB — VARICELLA ZOSTER ANTIBODY, IGG: Varicella IgG: 455.2 Index — ABNORMAL HIGH (ref <135.00)

## 2013-05-01 LAB — GC/CHLAMYDIA PROBE AMP
CT Probe RNA: NEGATIVE
GC Probe RNA: NEGATIVE

## 2013-05-01 LAB — HIV ANTIBODY (ROUTINE TESTING W REFLEX): HIV: NONREACTIVE

## 2013-05-01 LAB — URINALYSIS, MICROSCOPIC ONLY
Bacteria, UA: NONE SEEN
Casts: NONE SEEN

## 2013-05-01 LAB — RPR

## 2013-05-01 LAB — ABO AND RH

## 2013-05-01 LAB — URINE CULTURE: Colony Count: NO GROWTH

## 2013-05-01 LAB — OXYCODONE SCREEN, UA, RFLX CONFIRM: Oxycodone Screen, Ur: NEGATIVE ng/mL

## 2013-05-01 MED ORDER — PRENATAL PLUS 27-1 MG PO TABS: 1.0000 | ORAL_TABLET | Freq: Every day | ORAL | Status: DC

## 2013-05-02 ENCOUNTER — Telehealth: Payer: Self-pay | Admitting: *Deleted

## 2013-05-02 NOTE — Telephone Encounter (Signed)
Spoke with pt. First day of last period was 03/05/13. Pt states we was going to go by her period for her due date. That would give her a due date of 12/10/12. I told pt I would send this note to you. JSY

## 2013-05-07 ENCOUNTER — Encounter: Payer: Self-pay | Admitting: Women's Health

## 2013-05-28 ENCOUNTER — Encounter: Payer: Self-pay | Admitting: Women's Health

## 2013-05-28 ENCOUNTER — Ambulatory Visit (INDEPENDENT_AMBULATORY_CARE_PROVIDER_SITE_OTHER): Payer: Medicaid Other | Admitting: Obstetrics & Gynecology

## 2013-05-28 ENCOUNTER — Other Ambulatory Visit: Payer: Self-pay | Admitting: Obstetrics & Gynecology

## 2013-05-28 ENCOUNTER — Encounter: Payer: Self-pay | Admitting: Obstetrics & Gynecology

## 2013-05-28 ENCOUNTER — Ambulatory Visit (INDEPENDENT_AMBULATORY_CARE_PROVIDER_SITE_OTHER): Payer: Medicaid Other

## 2013-05-28 ENCOUNTER — Other Ambulatory Visit: Payer: Self-pay | Admitting: Women's Health

## 2013-05-28 VITALS — BP 110/60 | Wt 141.0 lb

## 2013-05-28 DIAGNOSIS — O468X9 Other antepartum hemorrhage, unspecified trimester: Secondary | ICD-10-CM

## 2013-05-28 DIAGNOSIS — Z331 Pregnant state, incidental: Secondary | ICD-10-CM

## 2013-05-28 DIAGNOSIS — Z36 Encounter for antenatal screening of mother: Secondary | ICD-10-CM

## 2013-05-28 DIAGNOSIS — Z3480 Encounter for supervision of other normal pregnancy, unspecified trimester: Secondary | ICD-10-CM

## 2013-05-28 DIAGNOSIS — O418X90 Other specified disorders of amniotic fluid and membranes, unspecified trimester, not applicable or unspecified: Secondary | ICD-10-CM | POA: Insufficient documentation

## 2013-05-28 DIAGNOSIS — Z1389 Encounter for screening for other disorder: Secondary | ICD-10-CM

## 2013-05-28 LAB — POCT URINALYSIS DIPSTICK
Glucose, UA: NEGATIVE
Ketones, UA: NEGATIVE
Nitrite, UA: NEGATIVE

## 2013-05-28 NOTE — Progress Notes (Signed)
Sonogram is reviewed and found to be normal   NT/IT done today No complaints, no bleeding 2nd IT next time Follow up 4 weeks

## 2013-05-28 NOTE — Progress Notes (Signed)
U/S(12+0wks)-transabdominal u/s performed, single IUP with +FCA noted, FHR- 160 bpm, cx appears long and closed, bilateral adnexa WNL, 13mm sub-chorionic hemorrhage noted in lower uterine segment near internal cx os, NT-1.17mm, NB present

## 2013-06-07 NOTE — L&D Delivery Note (Signed)
Delivery Note Pushed well to crowning. At 2:46 PM a viable and healthy female was delivered via Vaginal, Spontaneous Delivery (Presentation: ; Occiput Anterior).   No difficulty with shoulders APGAR: 9, 9; weight . TBA  Placenta status: Intact, Spontaneous.  Cord: 3 vessels with the following complications: None.    Anesthesia: Epidural  Episiotomy: None Lacerations: 1st degree;Perineal Suture Repair: 3.0 vicryl rapide Est. Blood Loss (mL): 200  Mom to postpartum.  Baby to Couplet care / Skin to Skin.  Wynelle BourgeoisWILLIAMS,MARIE 11/28/2013, 3:21 PM

## 2013-06-07 NOTE — L&D Delivery Note (Signed)
Attestation of Attending Supervision of Advanced Practitioner (CNM/NP): Evaluation and management procedures were performed by the Advanced Practitioner under my supervision and collaboration.  I have reviewed the Advanced Practitioner's note and chart, and I agree with the management and plan.  HARRAWAY-SMITH, Marnita Poirier 3:41 PM     

## 2013-06-25 ENCOUNTER — Encounter (INDEPENDENT_AMBULATORY_CARE_PROVIDER_SITE_OTHER): Payer: Self-pay

## 2013-06-25 ENCOUNTER — Encounter: Payer: Self-pay | Admitting: Women's Health

## 2013-06-25 ENCOUNTER — Ambulatory Visit (INDEPENDENT_AMBULATORY_CARE_PROVIDER_SITE_OTHER): Payer: Medicaid Other | Admitting: Women's Health

## 2013-06-25 ENCOUNTER — Other Ambulatory Visit: Payer: Self-pay | Admitting: Women's Health

## 2013-06-25 VITALS — BP 110/50 | Wt 141.5 lb

## 2013-06-25 DIAGNOSIS — Z331 Pregnant state, incidental: Secondary | ICD-10-CM

## 2013-06-25 DIAGNOSIS — Z1389 Encounter for screening for other disorder: Secondary | ICD-10-CM

## 2013-06-25 DIAGNOSIS — Z3480 Encounter for supervision of other normal pregnancy, unspecified trimester: Secondary | ICD-10-CM

## 2013-06-25 LAB — POCT URINALYSIS DIPSTICK
Glucose, UA: NEGATIVE
KETONES UA: NEGATIVE
Leukocytes, UA: NEGATIVE
Nitrite, UA: NEGATIVE
Protein, UA: NEGATIVE

## 2013-06-25 NOTE — Patient Instructions (Signed)
Second Trimester of Pregnancy The second trimester is from week 13 through week 28, months 4 through 6. The second trimester is often a time when you feel your best. Your body has also adjusted to being pregnant, and you begin to feel better physically. Usually, morning sickness has lessened or quit completely, you may have more energy, and you may have an increase in appetite. The second trimester is also a time when the fetus is growing rapidly. At the end of the sixth month, the fetus is about 9 inches long and weighs about 1 pounds. You will likely begin to feel the baby move (quickening) between 18 and 20 weeks of the pregnancy. BODY CHANGES Your body goes through many changes during pregnancy. The changes vary from woman to woman.   Your weight will continue to increase. You will notice your lower abdomen bulging out.  You may begin to get stretch marks on your hips, abdomen, and breasts.  You may develop headaches that can be relieved by medicines approved by your caregiver.  You may urinate more often because the fetus is pressing on your bladder.  You may develop or continue to have heartburn as a result of your pregnancy.  You may develop constipation because certain hormones are causing the muscles that push waste through your intestines to slow down.  You may develop hemorrhoids or swollen, bulging veins (varicose veins).  You may have back pain because of the weight gain and pregnancy hormones relaxing your joints between the bones in your pelvis and as a result of a shift in weight and the muscles that support your balance.  Your breasts will continue to grow and be tender.  Your gums may bleed and may be sensitive to brushing and flossing.  Dark spots or blotches (chloasma, mask of pregnancy) may develop on your face. This will likely fade after the baby is born.  A dark line from your belly button to the pubic area (linea nigra) may appear. This will likely fade after the  baby is born. WHAT TO EXPECT AT YOUR PRENATAL VISITS During a routine prenatal visit:  You will be weighed to make sure you and the fetus are growing normally.  Your blood pressure will be taken.  Your abdomen will be measured to track your baby's growth.  The fetal heartbeat will be listened to.  Any test results from the previous visit will be discussed. Your caregiver may ask you:  How you are feeling.  If you are feeling the baby move.  If you have had any abnormal symptoms, such as leaking fluid, bleeding, severe headaches, or abdominal cramping.  If you have any questions. Other tests that may be performed during your second trimester include:  Blood tests that check for:  Low iron levels (anemia).  Gestational diabetes (between 24 and 28 weeks).  Rh antibodies.  Urine tests to check for infections, diabetes, or protein in the urine.  An ultrasound to confirm the proper growth and development of the baby.  An amniocentesis to check for possible genetic problems.  Fetal screens for spina bifida and Down syndrome. HOME CARE INSTRUCTIONS   Avoid all smoking, herbs, alcohol, and unprescribed drugs. These chemicals affect the formation and growth of the baby.  Follow your caregiver's instructions regarding medicine use. There are medicines that are either safe or unsafe to take during pregnancy.  Exercise only as directed by your caregiver. Experiencing uterine cramps is a good sign to stop exercising.  Continue to eat regular,   healthy meals.  Wear a good support bra for breast tenderness.  Do not use hot tubs, steam rooms, or saunas.  Wear your seat belt at all times when driving.  Avoid raw meat, uncooked cheese, cat litter boxes, and soil used by cats. These carry germs that can cause birth defects in the baby.  Take your prenatal vitamins.  Try taking a stool softener (if your caregiver approves) if you develop constipation. Eat more high-fiber foods,  such as fresh vegetables or fruit and whole grains. Drink plenty of fluids to keep your urine clear or pale yellow.  Take warm sitz baths to soothe any pain or discomfort caused by hemorrhoids. Use hemorrhoid cream if your caregiver approves.  If you develop varicose veins, wear support hose. Elevate your feet for 15 minutes, 3 4 times a day. Limit salt in your diet.  Avoid heavy lifting, wear low heel shoes, and practice good posture.  Rest with your legs elevated if you have leg cramps or low back pain.  Visit your dentist if you have not gone yet during your pregnancy. Use a soft toothbrush to brush your teeth and be gentle when you floss.  A sexual relationship may be continued unless your caregiver directs you otherwise.  Continue to go to all your prenatal visits as directed by your caregiver. SEEK MEDICAL CARE IF:   You have dizziness.  You have mild pelvic cramps, pelvic pressure, or nagging pain in the abdominal area.  You have persistent nausea, vomiting, or diarrhea.  You have a bad smelling vaginal discharge.  You have pain with urination. SEEK IMMEDIATE MEDICAL CARE IF:   You have a fever.  You are leaking fluid from your vagina.  You have spotting or bleeding from your vagina.  You have severe abdominal cramping or pain.  You have rapid weight gain or loss.  You have shortness of breath with chest pain.  You notice sudden or extreme swelling of your face, hands, ankles, feet, or legs.  You have not felt your baby move in over an hour.  You have severe headaches that do not go away with medicine.  You have vision changes. Document Released: 05/18/2001 Document Revised: 01/24/2013 Document Reviewed: 07/25/2012 [employer] Patient Information 2014 [employer].  

## 2013-06-25 NOTE — Progress Notes (Signed)
Denies uc's, lof, vb, uti s/s.  Reviewed warning s/s to report.  Bloated, normal bm's 1-2x/d. Recommended avoiding gassy foods to see if it helps. Dizzy spells. Reviewed prevention/relief measures. All questions answered. F/U in 4wks for anatomy u/s and visit.  2nd IT today.

## 2013-06-29 LAB — MATERNAL SCREEN, INTEGRATED #2
AFP MOM MAT SCREEN: 0.87
AFP, SERUM MAT SCREEN: 30.3 ng/mL
Age risk Down Syndrome: 1:1100 {titer}
CALCULATED GESTATIONAL AGE MAT SCREEN: 15.7
Crown Rump Length: 52.9 mm
ESTRIOL FREE MAT SCREEN: 0.66 ng/mL
Estriol Mom: 0.72
Inhibin A Dimeric: 320 pg/mL
Inhibin A MoM: 1.84
MSS Trisomy 18 Risk: <1:5000 {titer}
NT MoM: 0.97
NUCHAL TRANSLUCENCY MAT SCREEN 2: 1.24 mm
Number of fetuses: 1
PAPP-A MoM: 0.75
PAPP-A: 527 ng/mL
Rish for ONTD: <1:5000 {titer}
hCG MoM: 3.3
hCG, Serum: 120 IU/mL

## 2013-07-02 ENCOUNTER — Encounter: Payer: Self-pay | Admitting: Women's Health

## 2013-07-19 ENCOUNTER — Emergency Department (HOSPITAL_COMMUNITY)
Admission: EM | Admit: 2013-07-19 | Discharge: 2013-07-19 | Disposition: A | Discharge status: Home / Self Care | Payer: Medicaid Other | Attending: Emergency Medicine | Admitting: Emergency Medicine

## 2013-07-19 ENCOUNTER — Encounter (HOSPITAL_COMMUNITY): Payer: Self-pay | Admitting: Emergency Medicine

## 2013-07-19 ENCOUNTER — Telehealth: Payer: Self-pay | Admitting: Obstetrics and Gynecology

## 2013-07-19 DIAGNOSIS — Z8719 Personal history of other diseases of the digestive system: Secondary | ICD-10-CM | POA: Insufficient documentation

## 2013-07-19 DIAGNOSIS — O9989 Other specified diseases and conditions complicating pregnancy, childbirth and the puerperium: Secondary | ICD-10-CM | POA: Insufficient documentation

## 2013-07-19 DIAGNOSIS — R197 Diarrhea, unspecified: Secondary | ICD-10-CM | POA: Insufficient documentation

## 2013-07-19 DIAGNOSIS — O210 Mild hyperemesis gravidarum: Secondary | ICD-10-CM | POA: Insufficient documentation

## 2013-07-19 DIAGNOSIS — R1110 Vomiting, unspecified: Secondary | ICD-10-CM

## 2013-07-19 DIAGNOSIS — R1084 Generalized abdominal pain: Secondary | ICD-10-CM | POA: Insufficient documentation

## 2013-07-19 LAB — CBC WITH DIFFERENTIAL/PLATELET
BASOS ABS: 0 10*3/uL (ref 0.0–0.1)
Basophils Relative: 0 % (ref 0–1)
Eosinophils Absolute: 0 10*3/uL (ref 0.0–0.7)
Eosinophils Relative: 0 % (ref 0–5)
HCT: 36.8 % (ref 36.0–46.0)
Hemoglobin: 12.7 g/dL (ref 12.0–15.0)
LYMPHS ABS: 1.3 10*3/uL (ref 0.7–4.0)
LYMPHS PCT: 11 % — AB (ref 12–46)
MCH: 30.8 pg (ref 26.0–34.0)
MCHC: 34.5 g/dL (ref 30.0–36.0)
MCV: 89.3 fL (ref 78.0–100.0)
Monocytes Absolute: 0.6 10*3/uL (ref 0.1–1.0)
Monocytes Relative: 5 % (ref 3–12)
Neutro Abs: 10.2 10*3/uL — ABNORMAL HIGH (ref 1.7–7.7)
Neutrophils Relative %: 84 % — ABNORMAL HIGH (ref 43–77)
PLATELETS: 239 10*3/uL (ref 150–400)
RBC: 4.12 MIL/uL (ref 3.87–5.11)
RDW: 14 % (ref 11.5–15.5)
WBC: 12.1 10*3/uL — ABNORMAL HIGH (ref 4.0–10.5)

## 2013-07-19 LAB — BASIC METABOLIC PANEL
BUN: 10 mg/dL (ref 6–23)
CO2: 22 meq/L (ref 19–32)
Calcium: 8.7 mg/dL (ref 8.4–10.5)
Chloride: 100 mEq/L (ref 96–112)
Creatinine, Ser: 0.44 mg/dL — ABNORMAL LOW (ref 0.50–1.10)
GFR calc Af Amer: >90 mL/min (ref >90)
GFR calc non Af Amer: >90 mL/min (ref >90)
GLUCOSE: 93 mg/dL (ref 70–99)
POTASSIUM: 3.6 meq/L — AB (ref 3.7–5.3)
SODIUM: 138 meq/L (ref 137–147)

## 2013-07-19 MED ORDER — ONDANSETRON HCL 4 MG/2ML IJ SOLN
4.0000 mg | Freq: Once | INTRAMUSCULAR | Status: AC
Start: 2013-07-19 — End: 2013-07-19
  Administered 2013-07-19: 4 mg via INTRAVENOUS
  Filled 2013-07-19: qty 2

## 2013-07-19 MED ORDER — ONDANSETRON 4 MG PO TBDP: ORAL_TABLET | ORAL | Status: DC

## 2013-07-19 MED ORDER — SODIUM CHLORIDE 0.9 % IV BOLUS (SEPSIS)
1000.0000 mL | Freq: Once | INTRAVENOUS | Status: AC
  Administered 2013-07-19: 1000 mL via INTRAVENOUS

## 2013-07-19 NOTE — Discharge Instructions (Signed)
Follow up if not improving.  Drink plenty of fluids °

## 2013-07-19 NOTE — ED Provider Notes (Signed)
CSN: [id number]     Arrival date & time 07/19/13  1233 History   This chart was scribed for Benny Lennert, MD by Bennett Scrape, ED Scribe. This patient was seen in room [facility] and the patient's care was started at 4:41 PM.   Chief Complaint  Patient presents with  . Emesis      Patient is a 23 y.o. female presenting with vomiting. The history is provided by the patient. No language interpreter was used.  Emesis Severity:  Moderate Duration: this morning. Timing:  Constant Number of daily episodes:  "a lot' Quality:  Stomach contents Progression:  Unchanged Chronicity:  New Context: not post-tussive   Associated symptoms: abdominal pain, chills (resolved) and diarrhea   Associated symptoms: no fever and no headaches   Risk factors: pregnant now (19 weeks)     HPI Comments: Rosilyn Coachman is a 23 y.o. female who is [redacted] weeks pregnant presents to the Emergency Department complaining of several episodes of non-bloody emesis with associated non-bloody diarrhea and cramping abdominal pain that started this morning. When asked, the pt states that she has experienced "a lot" of episodes of both emesis and diarrhea described as watery since the onset but is unable to specify a number. She also reports chills earlier that have since resolved. She is concerned about her intolerance to liquids due to being pregnant. She denies any pregnancy complications thus far. She denies any other symptoms currently.   Past Medical History  Diagnosis Date  . Pregnancy with one fetus   . Gallstone    Past Surgical History  Procedure Laterality Date  . No past surgeries    . Cholecystectomy  12/03/2011    Procedure: LAPAROSCOPIC CHOLECYSTECTOMY;  Surgeon: Dalia Heading, MD;  Location: [facility];  Service: General;  Laterality: N/A;   Family History  Problem Relation Age of Onset  . Anesthesia problems Neg Hx    History  Substance Use Topics  . Smoking status: Never Smoker   . Smokeless  tobacco: Never Used  . Alcohol Use: No   OB History   Grav Para Term Preterm Abortions TAB SAB Ect Mult Living   2 1 1       1      Review of Systems  Constitutional: Positive for chills (resolved). Negative for appetite change and fatigue.  HENT: Negative for congestion, ear discharge and sinus pressure.   Eyes: Negative for discharge.  Respiratory: Negative for cough.   Cardiovascular: Negative for chest pain.  Gastrointestinal: Positive for vomiting, abdominal pain and diarrhea.  Genitourinary: Negative for frequency and hematuria.  Musculoskeletal: Negative for back pain.  Skin: Negative for rash.  Neurological: Negative for seizures and headaches.  Psychiatric/Behavioral: Negative for hallucinations.      Allergies  Review of patient's allergies indicates no known allergies.  Home Medications   Current Outpatient Rx  Name  Route  Sig  Dispense  Refill  . Prenatal Vit-Fe Fumarate-FA (MULTIVITAMIN-PRENATAL) 27-0.8 MG TABS tablet   Oral   Take 1 tablet by mouth at bedtime.   30 each   12   . prenatal vitamin w/FE, FA (PRENATAL 1 + 1) 27-1 MG TABS tablet   Oral   Take 1 tablet by mouth daily at 12 noon.   30 each   11    Triage Vitals: BP 118/63  Pulse 111  Temp(Src) 98.3 F (36.8 C)  Resp 20  Ht 4\' 11"  (1.499 m)  Wt 139 lb 14.4 oz (63.458 kg)  BMI  28.24 kg/m2  SpO2 100%  LMP 03/05/2013  Physical Exam  Nursing note and vitals reviewed. Constitutional: She is oriented to person, place, and time. She appears well-developed and well-nourished.  HENT:  Head: Normocephalic and atraumatic.  Eyes: Conjunctivae and EOM are normal. No scleral icterus.  Neck: Neck supple. No thyromegaly present.  Cardiovascular: Normal rate and regular rhythm.  Exam reveals no gallop and no friction rub.   No murmur heard. Pulmonary/Chest: Effort normal and breath sounds normal. No stridor. She has no wheezes. She has no rales. She exhibits no tenderness.  Abdominal: She  exhibits no distension. There is tenderness (mild diffuse). There is no rebound.  Musculoskeletal: Normal range of motion. She exhibits no edema.  Lymphadenopathy:    She has no cervical adenopathy.  Neurological: She is alert and oriented to person, place, and time. She exhibits normal muscle tone. Coordination normal.  Skin: Skin is warm and dry. No rash noted. No erythema.  Psychiatric: She has a normal mood and affect. Her behavior is normal.    ED Course  Procedures (including critical care time)  Medications  sodium chloride 0.9 % bolus 1,000 mL (not administered)  ondansetron (ZOFRAN) injection 4 mg (not administered)    DIAGNOSTIC STUDIES: Oxygen Saturation is 100% on RA, normal by my interpretation.    COORDINATION OF CARE: 4:45 PM-Discussed treatment plan which includes fluids, Zofran and blood work with pt at bedside and pt agreed to plan.   6:46 PM-Pt rechecked and feels improved. Informed pt that her blood work has been normal thus far. Discussed discharge plan of Zofran Rx with pt and pt agreed. Encouraged pt to f/u with OB and addressed sxs to return for.   Labs Review Labs Reviewed  CBC WITH DIFFERENTIAL - Abnormal; Notable for the following:    WBC 12.1 (*)    Neutrophils Relative % 84 (*)    Neutro Abs 10.2 (*)    Lymphocytes Relative 11 (*)    All other components within normal limits  BASIC METABOLIC PANEL - Abnormal; Notable for the following:    Potassium 3.6 (*)    Creatinine, Ser 0.44 (*)    All other components within normal limits   Imaging Review No results found.  EKG Interpretation   None       MDM   Final diagnoses:  None  The chart was scribed for me under my direct supervision.  I personally performed the history, physical, and medical decision making and all procedures in the evaluation of this patient.Benny Lennert.     Melisia Leming L Trenia Tennyson, MD 07/19/13 [phone]

## 2013-07-19 NOTE — ED Notes (Signed)
Complain of vomiting and diarrhea today

## 2013-07-19 NOTE — Telephone Encounter (Signed)
Spoke with pt. Having nausea, diarrhea, vomiting since 3 am. No fever; feels weak; + drinking fluids but not staying down good. Pt states mouth is dry and not sure if she's urinated today because she is having diarrhea. Pt advised to [facility] for eval and possible fluids. Pt voiced understanding. JSY

## 2013-07-19 NOTE — ED Notes (Signed)
Patient given discharge instruction, verbalized understand. IV removed, band aid applied. Patient ambulatory out of the department.  

## 2013-07-23 ENCOUNTER — Ambulatory Visit (INDEPENDENT_AMBULATORY_CARE_PROVIDER_SITE_OTHER): Payer: Medicaid Other

## 2013-07-23 ENCOUNTER — Ambulatory Visit (INDEPENDENT_AMBULATORY_CARE_PROVIDER_SITE_OTHER): Payer: Medicaid Other | Admitting: Obstetrics and Gynecology

## 2013-07-23 ENCOUNTER — Other Ambulatory Visit: Payer: Self-pay | Admitting: Women's Health

## 2013-07-23 ENCOUNTER — Encounter: Payer: Self-pay | Admitting: Obstetrics and Gynecology

## 2013-07-23 VITALS — BP 102/54 | Wt 142.6 lb

## 2013-07-23 DIAGNOSIS — Z331 Pregnant state, incidental: Secondary | ICD-10-CM

## 2013-07-23 DIAGNOSIS — Z3480 Encounter for supervision of other normal pregnancy, unspecified trimester: Secondary | ICD-10-CM

## 2013-07-23 DIAGNOSIS — Z1389 Encounter for screening for other disorder: Secondary | ICD-10-CM

## 2013-07-23 DIAGNOSIS — Z3490 Encounter for supervision of normal pregnancy, unspecified, unspecified trimester: Secondary | ICD-10-CM

## 2013-07-23 LAB — POCT URINALYSIS DIPSTICK
GLUCOSE UA: NEGATIVE
Nitrite, UA: NEGATIVE
Protein, UA: NEGATIVE

## 2013-07-23 NOTE — Progress Notes (Signed)
5021w0d female presents for routine prenatal visit. Fundal height U+2

## 2013-07-23 NOTE — Progress Notes (Signed)
U/S(20+0wks)--active fetus, meas c/w dates, fluid wnl, posterior Gr 0 placenta, cx appears closed (3.7cm), bilateral adnexa wnl, FHR- 137 bpm, female fetus, no obvious abnl noted although unable to visualize OFT's due to fetal position

## 2013-08-20 ENCOUNTER — Encounter: Payer: Self-pay | Admitting: Obstetrics & Gynecology

## 2013-08-20 ENCOUNTER — Ambulatory Visit (INDEPENDENT_AMBULATORY_CARE_PROVIDER_SITE_OTHER): Payer: Medicaid Other | Admitting: Obstetrics & Gynecology

## 2013-08-20 VITALS — BP 110/60 | Wt 147.0 lb

## 2013-08-20 DIAGNOSIS — Z331 Pregnant state, incidental: Secondary | ICD-10-CM

## 2013-08-20 DIAGNOSIS — Z3480 Encounter for supervision of other normal pregnancy, unspecified trimester: Secondary | ICD-10-CM

## 2013-08-20 DIAGNOSIS — Z1389 Encounter for screening for other disorder: Secondary | ICD-10-CM

## 2013-08-20 LAB — POCT URINALYSIS DIPSTICK
GLUCOSE UA: NEGATIVE
Ketones, UA: NEGATIVE
NITRITE UA: NEGATIVE

## 2013-08-20 NOTE — Progress Notes (Signed)
BP weight and urine results all reviewed and noted. Patient reports good fetal movement, denies any bleeding and no rupture of membranes symptoms or regular contractions. Patient is without complaints. All questions were answered.  PNII + repeat sonogram next time

## 2013-08-20 NOTE — Addendum Note (Signed)
Addended by: Criss AlvinePULLIAM, Shandrell Boda G on: 08/20/2013 01:52 PM   Modules accepted: Orders

## 2013-09-17 ENCOUNTER — Other Ambulatory Visit: Payer: Self-pay | Admitting: Obstetrics & Gynecology

## 2013-09-17 ENCOUNTER — Encounter: Payer: Self-pay | Admitting: Obstetrics and Gynecology

## 2013-09-17 ENCOUNTER — Ambulatory Visit (INDEPENDENT_AMBULATORY_CARE_PROVIDER_SITE_OTHER): Payer: Medicaid Other

## 2013-09-17 ENCOUNTER — Ambulatory Visit (INDEPENDENT_AMBULATORY_CARE_PROVIDER_SITE_OTHER): Payer: Medicaid Other | Admitting: Obstetrics and Gynecology

## 2013-09-17 ENCOUNTER — Other Ambulatory Visit: Payer: Medicaid Other

## 2013-09-17 VITALS — BP 116/58 | Wt 150.2 lb

## 2013-09-17 DIAGNOSIS — O99019 Anemia complicating pregnancy, unspecified trimester: Secondary | ICD-10-CM

## 2013-09-17 DIAGNOSIS — Z1389 Encounter for screening for other disorder: Secondary | ICD-10-CM

## 2013-09-17 DIAGNOSIS — Z3480 Encounter for supervision of other normal pregnancy, unspecified trimester: Secondary | ICD-10-CM

## 2013-09-17 DIAGNOSIS — O358XX0 Maternal care for other (suspected) fetal abnormality and damage, not applicable or unspecified: Secondary | ICD-10-CM

## 2013-09-17 DIAGNOSIS — Z331 Pregnant state, incidental: Secondary | ICD-10-CM

## 2013-09-17 LAB — POCT URINALYSIS DIPSTICK
Glucose, UA: NEGATIVE
Ketones, UA: NEGATIVE
Nitrite, UA: NEGATIVE
Protein, UA: NEGATIVE

## 2013-09-17 LAB — CBC
HCT: 33.8 % — ABNORMAL LOW (ref 36.0–46.0)
Hemoglobin: 11.4 g/dL — ABNORMAL LOW (ref 12.0–15.0)
MCH: 30 pg (ref 26.0–34.0)
MCHC: 33.7 g/dL (ref 30.0–36.0)
MCV: 88.9 fL (ref 78.0–100.0)
PLATELETS: 222 10*3/uL (ref 150–400)
RBC: 3.8 MIL/uL — AB (ref 3.87–5.11)
RDW: 14.3 % (ref 11.5–15.5)
WBC: 9.8 10*3/uL (ref 4.0–10.5)

## 2013-09-17 NOTE — Progress Notes (Signed)
U/S(28+0wks)-vtx active fetus, meas c/w dates, EFW 2 lb 11 oz(56th %tile), fluid wnl, posterior-fundal Gr 1 placenta, cx appears closed (3.7cm), FHR-133 BPM, female fetus, anatomy reviewed no major abnl noted

## 2013-09-17 NOTE — Progress Notes (Signed)
776w0d female presents for routine prenatal visit today. She is complaining of pelvic discomfort and suprapubic pain which is worse with opening her legs (like when getting out of bed). She had a UTI with her first pregnancy and states this feels similar. She reports decrease in vaginal discharge since the beginning of the pregnancy.  koh negative, wet prep wbc's only impPhysiologic d/c of preg

## 2013-09-18 LAB — HSV 2 ANTIBODY, IGG: HSV 2 Glycoprotein G Ab, IgG: <0.1 IV

## 2013-09-18 LAB — GLUCOSE TOLERANCE, 2 HOURS W/ 1HR
GLUCOSE, FASTING: 85 mg/dL (ref 70–99)
Glucose, 1 hour: 122 mg/dL (ref 70–170)
Glucose, 2 hour: 106 mg/dL (ref 70–139)

## 2013-09-18 LAB — ANTIBODY SCREEN: Antibody Screen: NEGATIVE

## 2013-09-18 LAB — RPR

## 2013-09-18 LAB — HIV ANTIBODY (ROUTINE TESTING W REFLEX): HIV 1&2 Ab, 4th Generation: NONREACTIVE

## 2013-09-20 ENCOUNTER — Encounter: Payer: Self-pay | Admitting: Women's Health

## 2013-10-15 ENCOUNTER — Encounter: Payer: Self-pay | Admitting: Women's Health

## 2013-10-15 ENCOUNTER — Ambulatory Visit (INDEPENDENT_AMBULATORY_CARE_PROVIDER_SITE_OTHER): Payer: Medicaid Other | Admitting: Obstetrics and Gynecology

## 2013-10-15 VITALS — BP 100/50 | Wt 156.0 lb

## 2013-10-15 DIAGNOSIS — Z3480 Encounter for supervision of other normal pregnancy, unspecified trimester: Secondary | ICD-10-CM

## 2013-10-15 DIAGNOSIS — Z1389 Encounter for screening for other disorder: Secondary | ICD-10-CM

## 2013-10-15 DIAGNOSIS — O99019 Anemia complicating pregnancy, unspecified trimester: Secondary | ICD-10-CM

## 2013-10-15 DIAGNOSIS — Z331 Pregnant state, incidental: Secondary | ICD-10-CM

## 2013-10-15 LAB — POCT URINALYSIS DIPSTICK
GLUCOSE UA: NEGATIVE
Ketones, UA: NEGATIVE
Leukocytes, UA: NEGATIVE
NITRITE UA: NEGATIVE
PROTEIN UA: NEGATIVE

## 2013-10-15 NOTE — Progress Notes (Signed)
971w0d G2P1001 presents for routine prenatal visit today. She reports having lower abdominal pressure upon activity such as getting out of bed and walking. She denies vaginal bleeding or vaginal discharge.

## 2013-10-26 ENCOUNTER — Ambulatory Visit (INDEPENDENT_AMBULATORY_CARE_PROVIDER_SITE_OTHER): Payer: Medicaid Other | Admitting: Obstetrics & Gynecology

## 2013-10-26 ENCOUNTER — Encounter: Payer: Self-pay | Admitting: Obstetrics & Gynecology

## 2013-10-26 VITALS — BP 106/50 | Wt 155.0 lb

## 2013-10-26 DIAGNOSIS — Z331 Pregnant state, incidental: Secondary | ICD-10-CM

## 2013-10-26 DIAGNOSIS — Z3480 Encounter for supervision of other normal pregnancy, unspecified trimester: Secondary | ICD-10-CM

## 2013-10-26 DIAGNOSIS — Z1389 Encounter for screening for other disorder: Secondary | ICD-10-CM

## 2013-10-26 LAB — POCT URINALYSIS DIPSTICK
Blood, UA: NEGATIVE
Glucose, UA: NEGATIVE
KETONES UA: NEGATIVE
LEUKOCYTES UA: NEGATIVE
NITRITE UA: NEGATIVE

## 2013-10-26 NOTE — Progress Notes (Signed)
G2P1001 [redacted]w[redacted]d Estimated Date of Delivery: 12/10/13   BP weight and urine results all reviewed and noted. Patient reports good fetal movement, denies any bleeding and no rupture of membranes symptoms or regular contractions.  Patient is without complaints. All questions were answered.  Follow up in 2 weeks for routine OB appt

## 2013-11-12 ENCOUNTER — Encounter: Payer: Self-pay | Admitting: Women's Health

## 2013-11-12 ENCOUNTER — Ambulatory Visit (INDEPENDENT_AMBULATORY_CARE_PROVIDER_SITE_OTHER): Payer: Medicaid Other | Admitting: Women's Health

## 2013-11-12 VITALS — BP 108/60 | Wt 161.4 lb

## 2013-11-12 DIAGNOSIS — Z1389 Encounter for screening for other disorder: Secondary | ICD-10-CM

## 2013-11-12 DIAGNOSIS — Z331 Pregnant state, incidental: Secondary | ICD-10-CM

## 2013-11-12 DIAGNOSIS — Z3480 Encounter for supervision of other normal pregnancy, unspecified trimester: Secondary | ICD-10-CM

## 2013-11-12 LAB — POCT URINALYSIS DIPSTICK
Glucose, UA: NEGATIVE
Ketones, UA: NEGATIVE
Nitrite, UA: NEGATIVE
Protein, UA: NEGATIVE
RBC UA: NEGATIVE

## 2013-11-12 NOTE — Progress Notes (Signed)
Low-risk OB appointment G2P1001 [redacted]w[redacted]d Estimated Date of Delivery: 12/10/13 Blood pressure 108/60, weight 161 lb 6.4 oz (73.211 kg), last menstrual period 03/05/2013.  BP, weight, and urine results all reviewed and noted.  Please refer to the obstetrical flow sheet for the fundal height and fetal heart rate documentation. Reports good fm.  Denies regular uc's, lof, vb, or uti s/s. Hip/pelvis pain, reviewed relief measures. Reviewed ptl s/s, fkc. Plan:  Continued routine obstetrical care  F/U in 1 week for OB appointment, and gbs

## 2013-11-12 NOTE — Patient Instructions (Signed)
Preterm Labor Information Preterm labor is when labor starts at less than 37 weeks of pregnancy. The normal length of a pregnancy is 39 to 41 weeks. CAUSES Often, there is no identifiable underlying cause as to why a woman goes into preterm labor. One of the most common known causes of preterm labor is infection. Infections of the uterus, cervix, vagina, amniotic sac, bladder, kidney, or even the lungs (pneumonia) can cause labor to start. Other suspected causes of preterm labor include:   Urogenital infections, such as yeast infections and bacterial vaginosis.   Uterine abnormalities (uterine shape, uterine septum, fibroids, or bleeding from the placenta).   A cervix that has been operated on (it may fail to stay closed).   Malformations in the fetus.   Multiple gestations (twins, triplets, and so on).   Breakage of the amniotic sac.  RISK FACTORS  Having a previous history of preterm labor.   Having premature rupture of membranes (PROM).   Having a placenta that covers the opening of the cervix (placenta previa).   Having a placenta that separates from the uterus (placental abruption).   Having a cervix that is too weak to hold the fetus in the uterus (incompetent cervix).   Having too much fluid in the amniotic sac (polyhydramnios).   Taking illegal drugs or smoking while pregnant.   Not gaining enough weight while pregnant.   Being younger than 18 and older than 23 years old.   Having a low socioeconomic status.   Being African American. SYMPTOMS Signs and symptoms of preterm labor include:   Menstrual-like cramps, abdominal pain, or back pain.  Uterine contractions that are regular, as frequent as six in an hour, regardless of their intensity (may be mild or painful).  Contractions that start on the top of the uterus and spread down to the lower abdomen and back.   A sense of increased pelvic pressure.   A watery or bloody mucus discharge that  comes from the vagina.  TREATMENT Depending on the length of the pregnancy and other circumstances, your health care provider may suggest bed rest. If necessary, there are medicines that can be given to stop contractions and to mature the fetal lungs. If labor happens before 34 weeks of pregnancy, a prolonged hospital stay may be recommended. Treatment depends on the condition of both you and the fetus.  WHAT SHOULD YOU DO IF YOU THINK YOU ARE IN PRETERM LABOR? Call your health care provider right away. You will need to go to the hospital to get checked immediately. HOW CAN YOU PREVENT PRETERM LABOR IN FUTURE PREGNANCIES? You should:   Stop smoking if you smoke.  Maintain healthy weight gain and avoid chemicals and drugs that are not necessary.  Be watchful for any type of infection.  Inform your health care provider if you have a known history of preterm labor. Document Released: 08/14/2003 Document Revised: 01/24/2013 Document Reviewed: 06/26/2012 [employer] Patient Information 2014 [employer].    

## 2013-11-19 ENCOUNTER — Ambulatory Visit (INDEPENDENT_AMBULATORY_CARE_PROVIDER_SITE_OTHER): Payer: Medicaid Other | Admitting: Obstetrics and Gynecology

## 2013-11-19 ENCOUNTER — Encounter: Payer: Self-pay | Admitting: Obstetrics and Gynecology

## 2013-11-19 VITALS — BP 100/60

## 2013-11-19 DIAGNOSIS — Z3480 Encounter for supervision of other normal pregnancy, unspecified trimester: Secondary | ICD-10-CM

## 2013-11-19 DIAGNOSIS — Z1389 Encounter for screening for other disorder: Secondary | ICD-10-CM

## 2013-11-19 DIAGNOSIS — Z331 Pregnant state, incidental: Secondary | ICD-10-CM

## 2013-11-19 LAB — POCT URINALYSIS DIPSTICK
Blood, UA: NEGATIVE
GLUCOSE UA: NEGATIVE
Ketones, UA: NEGATIVE
Leukocytes, UA: NEGATIVE
Nitrite, UA: NEGATIVE
Protein, UA: NEGATIVE

## 2013-11-19 LAB — OB RESULTS CONSOLE GC/CHLAMYDIA
Chlamydia: NEGATIVE
GC PROBE AMP, GENITAL: NEGATIVE

## 2013-11-19 NOTE — Progress Notes (Signed)
G2P1001 5668w0d Estimated Date of Delivery: 12/10/13  Blood pressure 100/60, last menstrual period 03/05/2013.   BP weight and urine results all reviewed and noted. Please refer to the obstetrical flow sheet for the fundal height and fetal heart rate documentation: Patient reports good fetal movement, denies any bleeding and no rupture of membranes symptoms or regular contractions. Patient is without complaints. All questions were answered.  Physical Examination: Pelvic - normal external genitalia, vulva, vagina, cervix, uterus and adnexa   Plan:  Continued routine obstetrical care  Follow up in 1 week for OB appointment

## 2013-11-19 NOTE — Progress Notes (Signed)
Pt denies any problems or concerns at this time.  

## 2013-11-20 LAB — GC/CHLAMYDIA PROBE AMP
CT Probe RNA: NEGATIVE
GC PROBE AMP APTIMA: NEGATIVE

## 2013-11-21 ENCOUNTER — Encounter: Payer: Self-pay | Admitting: Obstetrics and Gynecology

## 2013-11-21 LAB — STREP B DNA PROBE: STREP GROUP B AG: NOT DETECTED

## 2013-11-23 ENCOUNTER — Encounter: Payer: Self-pay | Admitting: *Deleted

## 2013-11-23 ENCOUNTER — Telehealth: Payer: Self-pay | Admitting: *Deleted

## 2013-11-23 NOTE — Telephone Encounter (Signed)
Pt states noticed a little more tinged pinkish discharge when she went to restroom and wiped, +FM, no gush of fluids. Informed pt could be from were she was seen in the office on Monday and had cervixs checked. Continue to monitor if vaginal bleeding occurs, gush of fluids, [facility] Pt verbalized understanding.

## 2013-11-23 NOTE — Progress Notes (Signed)
Patient ID: Linda Pollard, female   DOB: 14-Mar-1991, 23 y.o.   MRN: [id number] Pt walked into the office this am with c/o cramping, loss mucus plug, + FM, no gush of fluids, irregular contractions. Pt encouraged to push fluids, take tylenol,  if contractions increase or intensify, [facility] Pt verbalized understanding.

## 2013-11-26 ENCOUNTER — Encounter: Payer: Medicaid Other | Admitting: Women's Health

## 2013-11-27 ENCOUNTER — Ambulatory Visit (INDEPENDENT_AMBULATORY_CARE_PROVIDER_SITE_OTHER): Payer: Medicaid Other | Admitting: Obstetrics & Gynecology

## 2013-11-27 ENCOUNTER — Encounter: Payer: Self-pay | Admitting: Obstetrics & Gynecology

## 2013-11-27 VITALS — BP 100/50 | Wt 165.0 lb

## 2013-11-27 DIAGNOSIS — Z1389 Encounter for screening for other disorder: Secondary | ICD-10-CM

## 2013-11-27 DIAGNOSIS — Z3480 Encounter for supervision of other normal pregnancy, unspecified trimester: Secondary | ICD-10-CM

## 2013-11-27 DIAGNOSIS — Z331 Pregnant state, incidental: Secondary | ICD-10-CM

## 2013-11-27 DIAGNOSIS — Z3483 Encounter for supervision of other normal pregnancy, third trimester: Secondary | ICD-10-CM

## 2013-11-27 LAB — POCT URINALYSIS DIPSTICK
Blood, UA: NEGATIVE
Glucose, UA: NEGATIVE
Ketones, UA: NEGATIVE
NITRITE UA: NEGATIVE

## 2013-11-27 NOTE — Addendum Note (Signed)
Addended by: Criss AlvinePULLIAM, CHRYSTAL G on: 11/27/2013 11:27 AM   Modules accepted: Orders

## 2013-11-27 NOTE — Progress Notes (Signed)
G2P1001 563w1d Estimated Date of Delivery: 12/10/13  Blood pressure 100/50, weight 165 lb (74.844 kg), last menstrual period 03/05/2013.   BP weight and urine results all reviewed and noted.  Please refer to the obstetrical flow sheet for the fundal height and fetal heart rate documentation:  Patient reports good fetal movement, denies any bleeding and no rupture of membranes symptoms or regular contractions. Patient is without complaints. All questions were answered.  Plan:  Continued routine obstetrical care,   Follow up in 1 weeks for OB appointment, routine

## 2013-11-28 ENCOUNTER — Encounter (HOSPITAL_COMMUNITY): Payer: Medicaid Other | Admitting: Anesthesiology

## 2013-11-28 ENCOUNTER — Inpatient Hospital Stay (HOSPITAL_COMMUNITY)
Admission: AD | Admit: 2013-11-28 | Discharge: 2013-11-29 | DRG: 775 | Disposition: A | Discharge status: Home / Self Care | Payer: Medicaid Other | Source: Ambulatory Visit | Attending: Obstetrics and Gynecology | Admitting: Obstetrics and Gynecology

## 2013-11-28 ENCOUNTER — Encounter (HOSPITAL_COMMUNITY): Payer: Self-pay

## 2013-11-28 ENCOUNTER — Inpatient Hospital Stay (HOSPITAL_COMMUNITY): Payer: Medicaid Other | Admitting: Anesthesiology

## 2013-11-28 DIAGNOSIS — O4290 Premature rupture of membranes, unspecified as to length of time between rupture and onset of labor, unspecified weeks of gestation: Secondary | ICD-10-CM | POA: Diagnosis present

## 2013-11-28 DIAGNOSIS — O468X3 Other antepartum hemorrhage, third trimester: Secondary | ICD-10-CM

## 2013-11-28 DIAGNOSIS — O418X30 Other specified disorders of amniotic fluid and membranes, third trimester, not applicable or unspecified: Secondary | ICD-10-CM

## 2013-11-28 DIAGNOSIS — Z3483 Encounter for supervision of other normal pregnancy, third trimester: Secondary | ICD-10-CM

## 2013-11-28 LAB — TYPE AND SCREEN
ABO/RH(D): O POS
ANTIBODY SCREEN: NEGATIVE

## 2013-11-28 LAB — CBC
HCT: 35 % — ABNORMAL LOW (ref 36.0–46.0)
Hemoglobin: 11.9 g/dL — ABNORMAL LOW (ref 12.0–15.0)
MCH: 31.2 pg (ref 26.0–34.0)
MCHC: 34 g/dL (ref 30.0–36.0)
MCV: 91.9 fL (ref 78.0–100.0)
PLATELETS: 163 10*3/uL (ref 150–400)
RBC: 3.81 MIL/uL — AB (ref 3.87–5.11)
RDW: 15.2 % (ref 11.5–15.5)
WBC: 9.8 10*3/uL (ref 4.0–10.5)

## 2013-11-28 LAB — ABO/RH: ABO/RH(D): O POS

## 2013-11-28 LAB — POCT FERN TEST: POCT Fern Test: POSITIVE

## 2013-11-28 LAB — RPR

## 2013-11-28 MED ORDER — TERBUTALINE SULFATE 1 MG/ML IJ SOLN: 0.2500 mg | Freq: Once | INTRAMUSCULAR | Status: DC | PRN

## 2013-11-28 MED ORDER — LACTATED RINGERS IV SOLN
INTRAVENOUS | Status: DC
  Administered 2013-11-28: 125 mL/h via INTRAVENOUS
  Administered 2013-11-28 (×2): via INTRAVENOUS

## 2013-11-28 MED ORDER — FENTANYL CITRATE 0.05 MG/ML IJ SOLN
100.0000 ug | INTRAMUSCULAR | Status: DC | PRN
  Administered 2013-11-28: 100 ug via INTRAVENOUS
  Filled 2013-11-28: qty 2

## 2013-11-28 MED ORDER — LACTATED RINGERS IV SOLN: 500.0000 mL | Freq: Once | INTRAVENOUS | Status: DC

## 2013-11-28 MED ORDER — DIBUCAINE 1 % RE OINT
1.0000 "application " | TOPICAL_OINTMENT | RECTAL | Status: DC | PRN
  Administered 2013-11-29: 1 via RECTAL

## 2013-11-28 MED ORDER — OXYTOCIN 40 UNITS IN LACTATED RINGERS INFUSION - SIMPLE MED: 62.5000 mL/h | INTRAVENOUS | Status: DC

## 2013-11-28 MED ORDER — EPHEDRINE 5 MG/ML INJ
10.0000 mg | INTRAVENOUS | Status: DC | PRN
  Filled 2013-11-28: qty 4
  Filled 2013-11-28: qty 2

## 2013-11-28 MED ORDER — OXYTOCIN 40 UNITS IN LACTATED RINGERS INFUSION - SIMPLE MED
1.0000 m[IU]/min | INTRAVENOUS | Status: DC
  Administered 2013-11-28: 2 m[IU]/min via INTRAVENOUS
  Filled 2013-11-28: qty 1000

## 2013-11-28 MED ORDER — WITCH HAZEL-GLYCERIN EX PADS
1.0000 "application " | MEDICATED_PAD | CUTANEOUS | Status: DC | PRN
  Administered 2013-11-29: 1 via TOPICAL

## 2013-11-28 MED ORDER — IBUPROFEN 600 MG PO TABS
600.0000 mg | ORAL_TABLET | Freq: Four times a day (QID) | ORAL | Status: DC
  Administered 2013-11-28 – 2013-11-29 (×3): 600 mg via ORAL
  Filled 2013-11-28 (×3): qty 1

## 2013-11-28 MED ORDER — LIDOCAINE HCL (PF) 1 % IJ SOLN
30.0000 mL | INTRAMUSCULAR | Status: DC | PRN
  Filled 2013-11-28: qty 30

## 2013-11-28 MED ORDER — OXYCODONE-ACETAMINOPHEN 5-325 MG PO TABS: 1.0000 | ORAL_TABLET | ORAL | Status: DC | PRN

## 2013-11-28 MED ORDER — OXYCODONE-ACETAMINOPHEN 5-325 MG PO TABS
1.0000 | ORAL_TABLET | ORAL | Status: DC | PRN
  Administered 2013-11-29 (×2): 1 via ORAL
  Filled 2013-11-28 (×2): qty 1

## 2013-11-28 MED ORDER — BENZOCAINE-MENTHOL 20-0.5 % EX AERO
1.0000 "application " | INHALATION_SPRAY | CUTANEOUS | Status: DC | PRN
  Administered 2013-11-29: 1 via TOPICAL
  Filled 2013-11-28: qty 56

## 2013-11-28 MED ORDER — CITRIC ACID-SODIUM CITRATE 334-500 MG/5ML PO SOLN: 30.0000 mL | ORAL | Status: DC | PRN

## 2013-11-28 MED ORDER — LIDOCAINE HCL (PF) 1 % IJ SOLN
INTRAMUSCULAR | Status: DC | PRN
  Administered 2013-11-28 (×3): 4 mL
  Administered 2013-11-28: 2 mL

## 2013-11-28 MED ORDER — DIPHENHYDRAMINE HCL 25 MG PO CAPS: 25.0000 mg | ORAL_CAPSULE | Freq: Four times a day (QID) | ORAL | Status: DC | PRN

## 2013-11-28 MED ORDER — DIPHENHYDRAMINE HCL 50 MG/ML IJ SOLN: 12.5000 mg | INTRAMUSCULAR | Status: DC | PRN

## 2013-11-28 MED ORDER — LANOLIN HYDROUS EX OINT: TOPICAL_OINTMENT | CUTANEOUS | Status: DC | PRN

## 2013-11-28 MED ORDER — SENNOSIDES-DOCUSATE SODIUM 8.6-50 MG PO TABS
2.0000 | ORAL_TABLET | ORAL | Status: DC
  Administered 2013-11-28: 2 via ORAL
  Filled 2013-11-28: qty 2

## 2013-11-28 MED ORDER — ONDANSETRON HCL 4 MG/2ML IJ SOLN: 4.0000 mg | INTRAMUSCULAR | Status: DC | PRN

## 2013-11-28 MED ORDER — PHENYLEPHRINE 40 MCG/ML (10ML) SYRINGE FOR IV PUSH (FOR BLOOD PRESSURE SUPPORT)
80.0000 ug | PREFILLED_SYRINGE | INTRAVENOUS | Status: DC | PRN
  Filled 2013-11-28: qty 2

## 2013-11-28 MED ORDER — OXYTOCIN BOLUS FROM INFUSION
500.0000 mL | INTRAVENOUS | Status: DC
  Administered 2013-11-28: 500 mL via INTRAVENOUS

## 2013-11-28 MED ORDER — ONDANSETRON HCL 4 MG PO TABS: 4.0000 mg | ORAL_TABLET | ORAL | Status: DC | PRN

## 2013-11-28 MED ORDER — PRENATAL MULTIVITAMIN CH
1.0000 | ORAL_TABLET | Freq: Every day | ORAL | Status: DC
  Administered 2013-11-29: 1 via ORAL
  Filled 2013-11-28: qty 1

## 2013-11-28 MED ORDER — ACETAMINOPHEN 325 MG PO TABS: 650.0000 mg | ORAL_TABLET | ORAL | Status: DC | PRN

## 2013-11-28 MED ORDER — TETANUS-DIPHTH-ACELL PERTUSSIS 5-2.5-18.5 LF-MCG/0.5 IM SUSP: 0.5000 mL | Freq: Once | INTRAMUSCULAR | Status: DC

## 2013-11-28 MED ORDER — ZOLPIDEM TARTRATE 5 MG PO TABS: 5.0000 mg | ORAL_TABLET | Freq: Every evening | ORAL | Status: DC | PRN

## 2013-11-28 MED ORDER — PHENYLEPHRINE 40 MCG/ML (10ML) SYRINGE FOR IV PUSH (FOR BLOOD PRESSURE SUPPORT)
80.0000 ug | PREFILLED_SYRINGE | INTRAVENOUS | Status: DC | PRN
  Filled 2013-11-28: qty 10
  Filled 2013-11-28: qty 2

## 2013-11-28 MED ORDER — SIMETHICONE 80 MG PO CHEW: 80.0000 mg | CHEWABLE_TABLET | ORAL | Status: DC | PRN

## 2013-11-28 MED ORDER — IBUPROFEN 600 MG PO TABS
600.0000 mg | ORAL_TABLET | Freq: Four times a day (QID) | ORAL | Status: DC | PRN
  Administered 2013-11-28: 600 mg via ORAL
  Filled 2013-11-28: qty 1

## 2013-11-28 MED ORDER — FENTANYL 2.5 MCG/ML BUPIVACAINE 1/10 % EPIDURAL INFUSION (WH - ANES)
14.0000 mL/h | INTRAMUSCULAR | Status: DC | PRN
  Administered 2013-11-28: 12 mL/h via EPIDURAL
  Filled 2013-11-28: qty 125

## 2013-11-28 MED ORDER — LACTATED RINGERS IV SOLN
500.0000 mL | INTRAVENOUS | Status: DC | PRN
  Administered 2013-11-28: 500 mL via INTRAVENOUS

## 2013-11-28 MED ORDER — ONDANSETRON HCL 4 MG/2ML IJ SOLN: 4.0000 mg | Freq: Four times a day (QID) | INTRAMUSCULAR | Status: DC | PRN

## 2013-11-28 MED ORDER — EPHEDRINE 5 MG/ML INJ
10.0000 mg | INTRAVENOUS | Status: DC | PRN
  Filled 2013-11-28: qty 2

## 2013-11-28 NOTE — Anesthesia Preprocedure Evaluation (Signed)

## 2013-11-28 NOTE — Progress Notes (Signed)
Linda Pollard is a 23 y.o. G2P1001 at 6356w2d by ultrasound admitted for active labor  Subjective: Doing well  Objective: BP 108/72  Pulse 97  Temp(Src) 98.3 F (36.8 C) (Oral)  Resp 18  Ht 4\' 11"  (1.499 m)  Wt 74.39 kg (164 lb)  BMI 33.11 kg/m2  SpO2 98%  LMP 03/05/2013      FHT:  FHR: 140 bpm, variability: moderate,  accelerations:  Present,  decelerations:  Absent UC:   regular, every 2 minutes SVE:   Dilation: 6 Effacement (%): 90 Station: -2 Exam by:: patti moore rn  Labs: Lab Results  Component Value Date   WBC 9.8 11/28/2013   HGB 11.9* 11/28/2013   HCT 35.0* 11/28/2013   MCV 91.9 11/28/2013   PLT 163 11/28/2013    Assessment / Plan: Augmentation of labor, progressing well  Labor: Progressing normally and Progressing on Pitocin, will continue to increase then AROM Preeclampsia:  n/a Fetal Wellbeing:  Category I Pain Control:  Labor support without medications I/D:  n/a Anticipated MOD:  NSVD  Suede Greenawalt 11/28/2013, 1:01 PM

## 2013-11-28 NOTE — MAU Note (Signed)
Pt states she thinks water broke at 1230am. Felt a small gush and saw clear fluid. Denies vag bleeding. +FM.

## 2013-11-28 NOTE — H&P (Signed)
Attestation of Attending Supervision of Advanced Practitioner (CNM/NP): Evaluation and management procedures were performed by the Advanced Practitioner under my supervision and collaboration.  I have reviewed the Advanced Practitioner's note and chart, and I agree with the management and plan.  CONSTANT,PEGGY 11/28/2013 11:06 AM

## 2013-11-28 NOTE — Progress Notes (Signed)
Linda Pollard is a 23 y.o. G2P1001 at 6292w2d admitted for SROM  Subjective: Feeling pressure   Objective: BP 97/53  Pulse 91  Temp(Src) 98.3 F (36.8 C) (Oral)  Resp 18  Ht 4\' 11"  (1.499 m)  Wt 74.39 kg (164 lb)  BMI 33.11 kg/m2  SpO2 99%  LMP 03/05/2013      FHT:  FHR: 125 bpm, variability: moderate,  accelerations:  Present,  decelerations:  Absent UC:   regular, every 3 minutes SVE:   Dilation: 6 Effacement (%): 90 Station: -2 Exam by:: patti moore rn  Labs: Lab Results  Component Value Date   WBC 9.8 11/28/2013   HGB 11.9* 11/28/2013   HCT 35.0* 11/28/2013   MCV 91.9 11/28/2013   PLT 163 11/28/2013    Assessment / Plan: Progressing on pit    Labor: Progressing, AROM of forebag Fetal Wellbeing:  Category I Pain Control:  Epidural I/D:  n/a Anticipated MOD:  NSVD  Linda Pollard, Linda Pollard 11/28/2013, 1:56 PM

## 2013-11-28 NOTE — H&P (Signed)
Linda Pollard is a 23 y.o. female G2P1001 with IUP at 4641w2d presenting for SROM. Pt states she has been having irregular, every 10 minutes contractions, associated with none vaginal bleeding.  Membranes are ruptured, clear fluid, with active fetal movement.   [facility] 8 wk 2 day by LMP c/w US @ 6 weeks  Prenatal History/Complications:  [facility]  [facility], 23yo, hispanic, 2nd baby, married  Pap 04/30/13: neg  GC/CT Initial:     -/-           36+wks: neg/neg  Genetic Screen NT/IT: neg  CF screen neg  Anatomic US Normal female 'Liam', recheck cardiac OFTs @ 28wks: normal  Flu vaccine 04/30/13  Glucose Screen  2 hr  GBS neg  Feed Preference breast  Contraception nexplanon  Circumcision declined  Childbirth Classes declined  Pediatrician [place]    Patient Active Problem List   Diagnosis Date Noted  . Subchorionic hemorrhage 05/28/2013  . Supervision of other normal pregnancy 04/30/2013    Past Medical History: Past Medical History  Diagnosis Date  . Pregnancy with one fetus   . Gallstone     Past Surgical History: Past Surgical History  Procedure Laterality Date  . No past surgeries    . Cholecystectomy  12/03/2011    Procedure: LAPAROSCOPIC CHOLECYSTECTOMY;  Surgeon: Dalia HeadingMark A Jenkins, MD;  Location: [facility];  Service: General;  Laterality: N/A;    Obstetrical History: OB History   Grav Para Term Preterm Abortions TAB SAB Ect Mult Living   2 1 1       1      Social History: History   Social History  . Marital Status: Single    Spouse Name: N/A    Number of Children: N/A  . Years of Education: N/A   Social History Main Topics  . Smoking status: Never Smoker   . Smokeless tobacco: Never Used  . Alcohol Use: No  . Drug Use: No  . Sexual Activity: Not Currently    Birth Control/ Protection: Other-see comments, None   Other Topics Concern  . None   Social History Narrative  . None    Family History: Family History   Problem Relation Age of Onset  . Anesthesia problems Neg Hx     Allergies: No Known Allergies  Prescriptions prior to admission  Medication Sig Dispense Refill  . ondansetron (ZOFRAN ODT) 4 MG disintegrating tablet 4mg  ODT q4 hours prn nausea/vomit  8 tablet  0  . Prenatal Vit-Fe Fumarate-FA (MULTIVITAMIN-PRENATAL) 27-0.8 MG TABS tablet Take 1 tablet by mouth at bedtime.  30 each  12     Review of Systems  CONST: no fever/chills/nausea/vomiting RESP: no SOB/cough CV: no CP, orthopnea GI: abd pain with ctx, no diarrhea/constipation GU: no dysuria/hematuria EXT: no swelling/calf pain out of the ordinary Pre-E: no ha/vc/scotomate/RUQ pain/edema   Physical exam: Blood pressure 107/68, pulse 109, temperature 98.4 F (36.9 C), temperature source Oral, resp. rate 18, weight 74.753 kg (164 lb 12.8 oz), last menstrual period 03/05/2013. General appearance: alert, cooperative and no distress Lungs: clear to auscultation bilaterally Heart: regular rate and rhythm Abdomen: soft, non-tender; bowel sounds normal Extremities: Homans sign is negative, no sign of DVT Presentation: cephalic Fetal monitoringBaseline: 130 bpm, Variability: Good {> 6 bpm), Accelerations: present and Decelerations: Absent Uterine activity  Moderate instensity, TOCO q 6 min Dilation: 3 Effacement (%): 50 Station: -3 Exam by:: Dr Lyn HollingsheadAlexander    Prenatal labs: ABO, Rh: O/POS/-- (11/24  1528) Antibody: NEG (04/13 0908) Rubella:  Immune RPR: NON REAC (04/13 0908)  HBsAg: NEGATIVE (11/24 1528)  HIV: NONREACTIVE (04/13 0908)  GBS: NOT DETECTED (06/15 1321)  2 hr Glucola normal Genetic screening normal Anatomy US normal    Prenatal Transfer Tool  Maternal Diabetes: No Genetic Screening: Normal Maternal Ultrasounds/Referrals: Normal Fetal Ultrasounds or other Referrals:  None Maternal Substance Abuse:  No Significant Maternal Medications:  None Significant Maternal Lab Results: Lab values include: Group  B Strep negative     Results for orders placed during the hospital encounter of 11/28/13 (from the past 24 hour(s))  POCT FERN TEST   Collection Time    11/28/13  4:43 AM      Result Value Ref Range   POCT Fern Test Positive = ruptured amniotic membanes    Results for orders placed in visit on 11/27/13 (from the past 24 hour(s))  POCT URINALYSIS DIPSTICK   Collection Time    11/27/13 11:26 AM      Result Value Ref Range   Color, UA       Clarity, UA       Glucose, UA neg     Bilirubin, UA       Ketones, UA neg     Spec Grav, UA       Blood, UA neg     pH, UA       Protein, UA trace     Urobilinogen, UA       Nitrite, UA neg     Leukocytes, UA moderate (2+)      Assessment: Linda Pollard is a 23 y.o. G2P1001 at 6946w2d by L=US here for SROM  Reports SROM 00:30 tonight. Confirmed by sterile speculum exam with pooling evident and positive fern test. Sterile cervical exam as above. Bishop score 7.   #Labor: intermittent/irregular contractions, patient opts to delay augmentation unless necessary, will try walking, see if she goes into active labor within next 12 hours/by 00:30 tomorrow (24 hours post SROM)  #Pain: Undecided, patient had epidural with last birth, counseled on optionc epidural/IV/natural, patient will try natural for now.  #FWB:  Category I #ID: GBS negative #MOF: breast #MOC: nexplanon #Circ:  decine  Sunnie Nielsenlexander, Natalie 11/28/2013, 4:56 AM  I was present for the exam and agree with above.  EdwardsVirginia Smith, CNM 11/28/2013 6:18 AM

## 2013-11-28 NOTE — Anesthesia Procedure Notes (Signed)
Epidural Patient location during procedure: OB Start time: 11/28/2013 12:47 PM  Staffing Performed by: anesthesiologist   Preanesthetic Checklist Completed: patient identified, site marked, surgical consent, pre-op evaluation, timeout performed, IV checked, risks and benefits discussed and monitors and equipment checked  Epidural Patient position: sitting Prep: site prepped and draped and DuraPrep Patient monitoring: continuous pulse ox and blood pressure Approach: midline Injection technique: LOR air  Needle:  Needle type: Tuohy  Needle gauge: 17 G Needle length: 9 cm and 9 Needle insertion depth: 5 cm cm Catheter type: closed end flexible Catheter size: 19 Gauge Catheter at skin depth: 10 cm Test dose: negative  Assessment Events: blood not aspirated, injection not painful, no injection resistance, negative IV test and no paresthesia  Additional Notes Discussed risk of headache, infection, bleeding, nerve injury and failed or incomplete block.  Patient voices understanding and wishes to proceed.  Epidural placed easily on first attempt.  No paresthesia.  Patient tolerated procedure well with no apparent complications.  Jasmine DecemberA. CAssidy, MDReason for block:procedure for pain

## 2013-11-28 NOTE — Progress Notes (Signed)
Linda Pollard is a 23 y.o. G2P1001 at 8242w2d admitted for SROM.   Subjective: Not feeling pressure or pain   Objective: BP 107/50  Pulse 91  Temp(Src) 97.9 F (36.6 C) (Oral)  Resp 18  Ht 4\' 11"  (1.499 m)  Wt 74.39 kg (164 lb)  BMI 33.11 kg/m2  LMP 03/05/2013      FHT:  FHR: 130 bpm, variability: moderate,  accelerations:  Present,  decelerations:  Absent UC:   regular, every 3 minutes SVE:   Dilation: 2 Effacement (%): 50 Station: -3 Exam by:: L.Stubbs, RN  Labs: Lab Results  Component Value Date   WBC 9.8 11/28/2013   HGB 11.9* 11/28/2013   HCT 35.0* 11/28/2013   MCV 91.9 11/28/2013   PLT 163 11/28/2013    Assessment / Plan: SROM, stalled will start pit.   Labor: starting pit  Fetal Wellbeing:  Category I Pain Control:  Labor support without medications currently. Deciding between IV pain vs Epidural  I/D:  GBS neg Anticipated MOD:  NSVD  Myra RudeSchmitz, Jeremy E 11/28/2013, 8:47 AM

## 2013-11-29 MED ORDER — IBUPROFEN 600 MG PO TABS: 600.0000 mg | ORAL_TABLET | Freq: Four times a day (QID) | ORAL | Status: DC

## 2013-11-29 NOTE — Lactation Note (Addendum)
This note was copied from the chart of Linda Pollard. Lactation Consultation Note  Patient Name: Linda Fredderick Severanceriel Reyes Pollard [id number]'s Date: 11/29/2013 Reason for consult: Follow-up assessment Per mom breast feeding is going better . Nipples tender ,. LC assessed and noted the left breast, positional strip, right appears healthy pink. LC reviewed basics and recommended prior to latch - breast massage , hand express, prepump if needed , latch with breast compressions until the baby is in a consistent pattern  And then intermittent with latch , and firm support. Reviewed sore nipple and engorgement  Prevention and tx. Per mom has a manual pump at home ( not sure of the name), has used it with  her 1st baby and worked well. Also referred mom to the Baby and Me booklet for resource.Mother informed  of post-discharge support and given phone number to the lactation department, including services for phone call  assistance; out-patient appointments; and breastfeeding support group. List of other breastfeeding resources in the  community given in the handout. Encouraged mother to call for problems or concerns related to breastfeeding. Baby had recently at 1420 per mom for 10 mins , per mom , feeding comfortable. LC instructed mom on the use shells and comfort gels for sore nipples.    Maternal Data Has patient been taught Hand Expression?: Yes  Feeding Feeding Type:  (per mom recently fed ) Length of feed: 10 min (per mom )  LATCH Score/Interventions earlier latch by LC  Latch: Grasps breast easily, tongue down, lips flanged, rhythmical sucking. Intervention(s): Assist with latch;Adjust position  Audible Swallowing: A few with stimulation Intervention(s): Hand expression  Type of Nipple: Everted at rest and after stimulation  Comfort (Breast/Nipple): Soft / non-tender     Hold (Positioning): Assistance needed to correctly position infant at breast and maintain latch. Intervention(s):  Breastfeeding basics reviewed (see LC note )  LATCH Score: 8  Lactation Tools Discussed/Used Tools: Shells;Comfort gels (per mom has a hand pump ) Shell Type: Inverted Pump Review: Milk Storage Initiated by:: MAI  Date initiated:: 11/29/13   Consult Status Consult Status: Complete Date: 11/29/13    Kathrin Greathouseorio, Margaret Ann 11/29/2013, 2:54 PM

## 2013-11-29 NOTE — Discharge Summary (Signed)
Obstetric Discharge Summary Reason for Admission: rupture of membranes  Prenatal Procedures: NST Intrapartum Procedures: spontaneous vaginal delivery Postpartum Procedures: none Complications-Operative and Postpartum: 1st degree perineal laceration Hemoglobin  Date Value Ref Range Status  11/28/2013 11.9* 12.0 - 15.0 g/dL Final     HCT  Date Value Ref Range Status  11/28/2013 35.0* 36.0 - 46.0 % Final    Delivery Note Pushed well to crowning. At 2:46 PM a viable and healthy female was delivered via Vaginal, Spontaneous Delivery (Presentation: ; Occiput Anterior).   No difficulty with shoulders APGAR: 9, 9; weight . TBA  Placenta status: Intact, Spontaneous.  Cord: 3 vessels with the following complications: None.    Anesthesia: Epidural  Episiotomy: None Lacerations: 1st degree;Perineal Suture Repair: 3.0 vicryl rapide Est. Blood Loss (mL): 200  Mom to postpartum.  Baby to Couplet care / Skin to Skin.  Wynelle BourgeoisWILLIAMS,MARIE 11/28/2013, 3:21 PM   Physical Exam:  General: alert, cooperative and no distress Lochia: appropriate Uterine Fundus: soft Incision: na DVT Evaluation: No cords or calf tenderness. No significant calf/ankle edema.  Discharge Diagnoses: Term Pregnancy-delivered  Discharge Information: Date: 11/29/2013 Activity: pelvic rest Diet: routine Medications: PNV and Ibuprofen Condition: stable Instructions: refer to practice specific booklet Discharge to: home   Newborn Data: Live born female  Birth Weight: 7 lb 7.2 oz (3380 g) APGAR: 9, 9  Home with mother.  Pt presented after ROM and some irregular contractions. She was augmented with pitocin and quickly delivered as above. Postpartum care was uncomplicated. She is breast feeding and desires nexplanon for contraception.    BECK, KELI L 11/29/2013, 7:45 AM

## 2013-11-29 NOTE — Anesthesia Postprocedure Evaluation (Signed)
Anesthesia Post Note  Patient: Linda Pollard  Procedure(s) Performed: * No procedures listed *  Anesthesia type: Epidural  Patient location: Mother/Baby  Post pain: Pain level controlled  Post assessment: Post-op Vital signs reviewed  Last Vitals:  Filed Vitals:   11/29/13 0515  BP: 100/59  Pulse: 94  Temp: 36.9 C  Resp: 18    Post vital signs: Reviewed  Level of consciousness:alert  Complications: No apparent anesthesia complications

## 2013-11-29 NOTE — Lactation Note (Signed)
This note was copied from the chart of Boy Teachers Insurance and Annuity Associationriel Reyes Lares. Lactation Consultation Note Mom BF w/baby laying in her lap on a pillow, mom leaning forward feeding baby on its back w/head turned towards mom feeding. Repositioned mom and baby w/pillows and discussed different options in feeding and importance of proper positioning and comfort. Mom had sleeper, socks, and onsie on baby. Baby sweating behind neck. Discussed how to see if baby is hot or cold. Mom has good breast anatomy. Hand expression taught w/good colostrum noted. Baby latched well. Mom encouraged to feed baby 8-12 times/24 hours and with feeding cues. Mom encouraged to feed baby w/feeding cuesSpecifics of an asymmetric latch shown. Reviewed Baby & Me book's Breastfeeding Basics. WH/LC brochure given w/resources, support groups and LC services.Educated about newborn behavior. Encouraged to call for assistance if needed and to verify proper latch.Mom reports + breast changes w/pregnancy. Referred to Baby and Me Book in Breastfeeding section Pg. 22-23 for position options and Proper latch demonstration. Patient Name: Boy Fredderick Severanceriel Reyes Lares [id number]'s Date: 11/29/2013 Reason for consult: Initial assessment   Maternal Data Infant to breast within first hour of birth: Yes Has patient been taught Hand Expression?: Yes Does the patient have breastfeeding experience prior to this delivery?: No  Feeding Feeding Type: Breast Fed Length of feed: 20 min  LATCH Score/Interventions Latch: Grasps breast easily, tongue down, lips flanged, rhythmical sucking. Intervention(s): Adjust position;Assist with latch;Breast massage;Breast compression  Audible Swallowing: A few with stimulation Intervention(s): Hand expression;Alternate breast massage  Type of Nipple: Everted at rest and after stimulation  Comfort (Breast/Nipple): Soft / non-tender     Hold (Positioning): Assistance needed to correctly position infant at breast and maintain  latch. Intervention(s): Breastfeeding basics reviewed;Support Pillows;Position options;Skin to skin  LATCH Score: 8  Lactation Tools Discussed/Used     Consult Status Consult Status: Follow-up Date: 11/30/13 Follow-up type: In-patient    Charyl DancerCARVER, LAURA G 11/29/2013, 3:40 AM

## 2013-11-29 NOTE — Discharge Instructions (Signed)
Vaginal Delivery °Care After °Refer to this sheet in the next few weeks. These discharge instructions provide you with information on caring for yourself after delivery. Your caregiver may also give you specific instructions. Your treatment has been planned according to the most current medical practices available, but problems sometimes occur. Call your caregiver if you have any problems or questions after you go home. °HOME CARE INSTRUCTIONS °· Take over-the-counter or prescription medicines only as directed by your caregiver or pharmacist. °· Do not drink alcohol, especially if you are breastfeeding or taking medicine to relieve pain. °· Do not chew or smoke tobacco. °· Do not use illegal drugs. °· Continue to use good perineal care. Good perineal care includes: °¨ Wiping your perineum from front to back. °¨ Keeping your perineum clean. °· Do not use tampons or douche until your caregiver says it is okay. °· Shower, wash your hair, and take tub baths as directed by your caregiver. °· Wear a well-fitting bra that provides breast support. °· Eat healthy foods. °· Drink enough fluids to keep your urine clear or pale yellow. °· Eat high-fiber foods such as whole grain cereals and breads, brown rice, beans, and fresh fruits and vegetables every day. These foods may help prevent or relieve constipation. °· Follow your cargiver's recommendations regarding resumption of activities such as climbing stairs, driving, lifting, exercising, or traveling. °· Talk to your caregiver about resuming sexual activities. Resumption of sexual activities is dependent upon your risk of infection, your rate of healing, and your comfort and desire to resume sexual activity. °· Try to have someone help you with your household activities and your newborn for at least a few days after you leave the hospital. °· Rest as much as possible. Try to rest or take a nap when your newborn is sleeping. °· Increase your activities gradually. °· Keep all  of your scheduled postpartum appointments. It is very important to keep your scheduled follow-up appointments. At these appointments, your caregiver will be checking to make sure that you are healing physically and emotionally. °SEEK MEDICAL CARE IF:  °· You are passing large clots from your vagina. Save any clots to show your caregiver. °· You have a foul smelling discharge from your vagina. °· You have trouble urinating. °· You are urinating frequently. °· You have pain when you urinate. °· You have a change in your bowel movements. °· You have increasing redness, pain, or swelling near your vaginal incision (episiotomy) or vaginal tear. °· You have pus draining from your episiotomy or vaginal tear. °· Your episiotomy or vaginal tear is separating. °· You have painful, hard, or reddened breasts. °· You have a severe headache. °· You have blurred vision or see spots. °· You feel sad or depressed. °· You have thoughts of hurting yourself or your newborn. °· You have questions about your care, the care of your newborn, or medicines. °· You are dizzy or lightheaded. °· You have a rash. °· You have nausea or vomiting. °· You were breastfeeding and have not had a menstrual period within 12 weeks after you stopped breastfeeding. °· You are not breastfeeding and have not had a menstrual period by the 12th week after delivery. °· You have a fever. °SEEK IMMEDIATE MEDICAL CARE IF:  °· You have persistent pain. °· You have chest pain. °· You have shortness of breath. °· You faint. °· You have leg pain. °· You have stomach pain. °· Your vaginal bleeding saturates two or more sanitary pads   in 1 hour. °MAKE SURE YOU:  °· Understand these instructions. °· Will watch your condition. °· Will get help right away if you are not doing well or get worse. ° ° °Document Released: 05/21/2000 Document Revised: 02/16/2012 Document Reviewed: 01/19/2012 °[employer]® Patient Information ©2015 [employer]. This information is not intended to  replace advice given to you by your health care provider. Make sure you discuss any questions you have with your health care provider. ° °

## 2013-12-04 ENCOUNTER — Encounter: Payer: Medicaid Other | Admitting: Obstetrics & Gynecology

## 2013-12-27 ENCOUNTER — Encounter: Payer: Self-pay | Admitting: Advanced Practice Midwife

## 2013-12-27 ENCOUNTER — Ambulatory Visit (INDEPENDENT_AMBULATORY_CARE_PROVIDER_SITE_OTHER): Payer: Medicaid Other | Admitting: Advanced Practice Midwife

## 2013-12-27 NOTE — Progress Notes (Signed)
  Linda Pollard is a 23 y.o. who presents for a postpartum visit. She is 4 weeks postpartum following a spontaneous vaginal delivery. I have fully reviewed the prenatal and intrapartum course. The delivery was at 38.2 gestational weeks.  Anesthesia: epidural. Postpartum course has been uneventful. Baby's course has been uneventful. Baby is feeding by bottle. Bleeding: staining only. Bowel function is normal. Bladder function is normal. Patient is not sexually active. Contraception method is none. Postpartum depression screening: negative.    Review of Systems   Constitutional: Negative for fever and chills Eyes: Negative for visual disturbances Respiratory: Negative for shortness of breath, dyspnea Cardiovascular: Negative for chest pain or palpitations  Gastrointestinal: Negative for vomiting, diarrhea and constipation Genitourinary: Negative for dysuria and urgency Musculoskeletal: Negative for back pain, joint pain, myalgias  Neurological: Negative for dizziness and headaches   Objective:     Filed Vitals:   12/27/13 1428  BP: 98/60   General:  alert, cooperative and no distress   Breasts:  negative  Lungs: clear to auscultation bilaterally  Heart:  regular rate and rhythm  Abdomen: Soft, nontender   Vulva:  normal  Vagina: normal vagina  Cervix:  closed  Corpus: Well involuted     Rectal Exam: no hemorrhoids        Assessment:    normal postpartum exam.  Plan:    1. Contraception: Nexplanon 2. Follow up in: asap for Nexplanon  or as needed.

## 2014-01-01 ENCOUNTER — Ambulatory Visit (INDEPENDENT_AMBULATORY_CARE_PROVIDER_SITE_OTHER): Payer: Medicaid Other | Admitting: Adult Health

## 2014-01-01 ENCOUNTER — Encounter: Payer: Self-pay | Admitting: Adult Health

## 2014-01-01 VITALS — BP 104/46 | Ht <= 58 in | Wt 143.5 lb

## 2014-01-01 DIAGNOSIS — Z30017 Encounter for initial prescription of implantable subdermal contraceptive: Secondary | ICD-10-CM | POA: Insufficient documentation

## 2014-01-01 DIAGNOSIS — Z3202 Encounter for pregnancy test, result negative: Secondary | ICD-10-CM

## 2014-01-01 HISTORY — DX: Encounter for initial prescription of implantable subdermal contraceptive: Z30.017

## 2014-01-01 LAB — POCT URINE PREGNANCY: Preg Test, Ur: NEGATIVE

## 2014-01-01 NOTE — Patient Instructions (Signed)
Use condoms x 2 weeks, keep clean and dry x 24 hours, no heavy lifting, keep steri strips on x 72 hours, Keep pressure dressing on x 24 hours. Follow up prn problems. Return in  1 year for pap Etonogestrel implant What is this medicine? ETONOGESTREL (et oh noe JES trel) is a contraceptive (birth control) device. It is used to prevent pregnancy. It can be used for up to 3 years. This medicine may be used for other purposes; ask your health care provider or pharmacist if you have questions. COMMON BRAND NAME(S): Implanon, Nexplanon What should I tell my health care provider before I take this medicine? They need to know if you have any of these conditions: -abnormal vaginal bleeding -blood vessel disease or blood clots -cancer of the breast, cervix, or liver -depression -diabetes -gallbladder disease -headaches -heart disease or recent heart attack -high blood pressure -high cholesterol -kidney disease -liver disease -renal disease -seizures -tobacco smoker -an unusual or allergic reaction to etonogestrel, other hormones, anesthetics or antiseptics, medicines, foods, dyes, or preservatives -pregnant or trying to get pregnant -breast-feeding How should I use this medicine? This device is inserted just under the skin on the inner side of your upper arm by a health care professional. Talk to your pediatrician regarding the use of this medicine in children. Special care may be needed. Overdosage: If you think you've taken too much of this medicine contact a poison control center or emergency room at once. Overdosage: If you think you have taken too much of this medicine contact a poison control center or emergency room at once. NOTE: This medicine is only for you. Do not share this medicine with others. What if I miss a dose? This does not apply. What may interact with this medicine? Do not take this medicine with any of the following  medications: -amprenavir -bosentan -fosamprenavir This medicine may also interact with the following medications: -barbiturate medicines for inducing sleep or treating seizures -certain medicines for fungal infections like ketoconazole and itraconazole -griseofulvin -medicines to treat seizures like carbamazepine, felbamate, oxcarbazepine, phenytoin, topiramate -modafinil -phenylbutazone -rifampin -some medicines to treat HIV infection like atazanavir, indinavir, lopinavir, nelfinavir, tipranavir, ritonavir -St. John's wort This list may not describe all possible interactions. Give your health care provider a list of all the medicines, herbs, non-prescription drugs, or dietary supplements you use. Also tell them if you smoke, drink alcohol, or use illegal drugs. Some items may interact with your medicine. What should I watch for while using this medicine? This product does not protect you against HIV infection (AIDS) or other sexually transmitted diseases. You should be able to feel the implant by pressing your fingertips over the skin where it was inserted. Tell your doctor if you cannot feel the implant. What side effects may I notice from receiving this medicine? Side effects that you should report to your doctor or health care professional as soon as possible: -allergic reactions like skin rash, itching or hives, swelling of the face, lips, or tongue -breast lumps -changes in vision -confusion, trouble speaking or understanding -dark urine -depressed mood -general ill feeling or flu-like symptoms -light-colored stools -loss of appetite, nausea -right upper belly pain -severe headaches -severe pain, swelling, or tenderness in the abdomen -shortness of breath, chest pain, swelling in a leg -signs of pregnancy -sudden numbness or weakness of the face, arm or leg -trouble walking, dizziness, loss of balance or coordination -unusual vaginal bleeding, discharge -unusually weak or  tired -yellowing of the eyes or skin Side  effects that usually do not require medical attention (Report these to your doctor or health care professional if they continue or are bothersome.): -acne -breast pain -changes in weight -cough -fever or chills -headache -irregular menstrual bleeding -itching, burning, and vaginal discharge -pain or difficulty passing urine -sore throat This list may not describe all possible side effects. Call your doctor for medical advice about side effects. You may report side effects to FDA at 1-800-FDA-1088. Where should I keep my medicine? This drug is given in a hospital or clinic and will not be stored at home. NOTE: This sheet is a summary. It may not cover all possible information. If you have questions about this medicine, talk to your doctor, pharmacist, or health care provider.  2015, Elsevier/Gold Standard. (2011-11-29 15:37:45)

## 2014-01-01 NOTE — Progress Notes (Signed)
Subjective:     Patient ID: Linda IvanAriel Pollard, female   DOB: December 24, 1990, 23 y.o.   MRN: [id number]  HPI Linda Pollard is a 23 year old Hispanic female, married, who delivered 11/28/13 baby boy, still spotting, in for nexplanon insertion.She has not had sex since delivery.  Review of Systems See HPI Reviewed past medical,surgical, social and family history. Reviewed medications and allergies.     Objective:   Physical Exam BP 104/46  Ht 4\' 9"  (1.448 m)  Wt 143 lb 8 oz (65.091 kg)  BMI 31.04 kg/m2  Breastfeeding? NoUPT negative, consent signed,time out called, Left arm cleansed with betadine, and injected with 1.5 cc 2% lidocaine and waited til numb. Nexplanon easily inserted and steri strips applied.Easily palpated by pt and provider. Pressure dressing applied.    Assessment:     Nexplanon insertion lot # [id number] exp. 10/17     Plan:    Use condoms x 2 weeks, keep clean and dry x 24 hours, no heavy lifting, keep steri strips on x 72 hours, Keep pressure dressing on x 24 hours. Follow up prn problems.   Return in 1 year for pap and physical Handout given on nexplanon

## 2014-01-01 NOTE — Addendum Note (Signed)
Addended by: Cyril MourningGRIFFIN, Brandii Lakey A on: 01/01/2014 02:23 PM   Modules accepted: Orders

## 2014-04-08 ENCOUNTER — Encounter: Payer: Self-pay | Admitting: Adult Health

## 2014-09-19 ENCOUNTER — Encounter (HOSPITAL_COMMUNITY): Payer: Self-pay | Admitting: *Deleted

## 2014-09-19 ENCOUNTER — Emergency Department (HOSPITAL_COMMUNITY)
Admission: EM | Admit: 2014-09-19 | Discharge: 2014-09-19 | Disposition: A | Discharge status: Home / Self Care | Payer: Self-pay | Attending: Emergency Medicine | Admitting: Emergency Medicine

## 2014-09-19 ENCOUNTER — Emergency Department (HOSPITAL_COMMUNITY): Payer: Medicaid Other

## 2014-09-19 DIAGNOSIS — K529 Noninfective gastroenteritis and colitis, unspecified: Secondary | ICD-10-CM | POA: Insufficient documentation

## 2014-09-19 DIAGNOSIS — R109 Unspecified abdominal pain: Secondary | ICD-10-CM

## 2014-09-19 DIAGNOSIS — Z9049 Acquired absence of other specified parts of digestive tract: Secondary | ICD-10-CM | POA: Insufficient documentation

## 2014-09-19 DIAGNOSIS — R1013 Epigastric pain: Secondary | ICD-10-CM

## 2014-09-19 DIAGNOSIS — Z3202 Encounter for pregnancy test, result negative: Secondary | ICD-10-CM | POA: Insufficient documentation

## 2014-09-19 DIAGNOSIS — Z79899 Other long term (current) drug therapy: Secondary | ICD-10-CM | POA: Insufficient documentation

## 2014-09-19 LAB — CBC WITH DIFFERENTIAL/PLATELET
BASOS ABS: 0 10*3/uL (ref 0.0–0.1)
Basophils Relative: 0 % (ref 0–1)
Eosinophils Absolute: 0 10*3/uL (ref 0.0–0.7)
Eosinophils Relative: 0 % (ref 0–5)
HEMATOCRIT: 44.5 % (ref 36.0–46.0)
Hemoglobin: 15.2 g/dL — ABNORMAL HIGH (ref 12.0–15.0)
LYMPHS PCT: 8 % — AB (ref 12–46)
Lymphs Abs: 1.3 10*3/uL (ref 0.7–4.0)
MCH: 30.2 pg (ref 26.0–34.0)
MCHC: 34.2 g/dL (ref 30.0–36.0)
MCV: 88.3 fL (ref 78.0–100.0)
Monocytes Absolute: 0.7 10*3/uL (ref 0.1–1.0)
Monocytes Relative: 4 % (ref 3–12)
Neutro Abs: 13.4 10*3/uL — ABNORMAL HIGH (ref 1.7–7.7)
Neutrophils Relative %: 88 % — ABNORMAL HIGH (ref 43–77)
Platelets: 243 10*3/uL (ref 150–400)
RBC: 5.04 MIL/uL (ref 3.87–5.11)
RDW: 13.1 % (ref 11.5–15.5)
WBC: 15.3 10*3/uL — AB (ref 4.0–10.5)

## 2014-09-19 LAB — URINALYSIS, ROUTINE W REFLEX MICROSCOPIC
Bilirubin Urine: NEGATIVE
GLUCOSE, UA: NEGATIVE mg/dL
Hgb urine dipstick: NEGATIVE
Ketones, ur: 40 mg/dL — AB
LEUKOCYTES UA: NEGATIVE
Nitrite: NEGATIVE
PH: 5.5 (ref 5.0–8.0)
PROTEIN: 30 mg/dL — AB
Specific Gravity, Urine: >1.03 — ABNORMAL HIGH (ref 1.005–1.030)
Urobilinogen, UA: 0.2 mg/dL (ref 0.0–1.0)

## 2014-09-19 LAB — COMPREHENSIVE METABOLIC PANEL
ALK PHOS: 69 U/L (ref 39–117)
ALT: 18 U/L (ref 0–35)
AST: 18 U/L (ref 0–37)
Albumin: 4.5 g/dL (ref 3.5–5.2)
Anion gap: 8 (ref 5–15)
BUN: 17 mg/dL (ref 6–23)
CHLORIDE: 104 mmol/L (ref 96–112)
CO2: 28 mmol/L (ref 19–32)
CREATININE: 0.58 mg/dL (ref 0.50–1.10)
Calcium: 9.2 mg/dL (ref 8.4–10.5)
GFR calc Af Amer: >90 mL/min (ref >90)
Glucose, Bld: 120 mg/dL — ABNORMAL HIGH (ref 70–99)
POTASSIUM: 3.5 mmol/L (ref 3.5–5.1)
Sodium: 140 mmol/L (ref 135–145)
Total Bilirubin: 0.4 mg/dL (ref 0.3–1.2)
Total Protein: 8.1 g/dL (ref 6.0–8.3)

## 2014-09-19 LAB — URINE MICROSCOPIC-ADD ON

## 2014-09-19 LAB — PREGNANCY, URINE: PREG TEST UR: NEGATIVE

## 2014-09-19 LAB — LIPASE, BLOOD: Lipase: 26 U/L (ref 11–59)

## 2014-09-19 MED ORDER — PROMETHAZINE HCL 25 MG PO TABS: 25.0000 mg | ORAL_TABLET | Freq: Four times a day (QID) | ORAL | Status: DC | PRN

## 2014-09-19 MED ORDER — MORPHINE SULFATE 4 MG/ML IJ SOLN
4.0000 mg | Freq: Once | INTRAMUSCULAR | Status: AC
  Administered 2014-09-19: 4 mg via INTRAVENOUS
  Filled 2014-09-19: qty 1

## 2014-09-19 MED ORDER — GI COCKTAIL ~~LOC~~
30.0000 mL | Freq: Once | ORAL | Status: AC
  Administered 2014-09-19: 30 mL via ORAL
  Filled 2014-09-19: qty 30

## 2014-09-19 MED ORDER — SODIUM CHLORIDE 0.9 % IV BOLUS (SEPSIS)
1000.0000 mL | Freq: Once | INTRAVENOUS | Status: AC
  Administered 2014-09-19: 1000 mL via INTRAVENOUS

## 2014-09-19 MED ORDER — IBUPROFEN 400 MG PO TABS: 400.0000 mg | ORAL_TABLET | Freq: Four times a day (QID) | ORAL | Status: DC | PRN

## 2014-09-19 MED ORDER — PROMETHAZINE HCL 25 MG RE SUPP: 25.0000 mg | Freq: Four times a day (QID) | RECTAL | Status: DC | PRN

## 2014-09-19 MED ORDER — ONDANSETRON HCL 4 MG/2ML IJ SOLN
4.0000 mg | Freq: Once | INTRAMUSCULAR | Status: AC
  Administered 2014-09-19: 4 mg via INTRAVENOUS
  Filled 2014-09-19: qty 2

## 2014-09-19 NOTE — Discharge Instructions (Signed)
Return to the ER if the symptoms get worse. You have a stomach flu. Hydration becomes extremely important. Symptoms should improve over the course of few days. Start with clear liquid diet, and slowly advanced to normal food  Clear Liquid Diet A clear liquid diet is a short-term diet that is prescribed to provide the necessary fluid and basic energy you need when you can have nothing else. The clear liquid diet consists of liquids or solids that will become liquid at room temperature. You should be able to see through the liquid. There are many reasons that you may be restricted to clear liquids, such as:  When you have a sudden-onset (acute) condition that occurs before or after surgery.  To help your body slowly get adjusted to food again after a long period when you were unable to have food.  Replacement of fluids when you have a diarrheal disease.  When you are going to have certain exams, such as a colonoscopy, in which instruments are inserted inside your body to look at parts of your digestive system. WHAT CAN I HAVE? A clear liquid diet does not provide all the nutrients you need. It is important to choose a variety of the following items to get as many nutrients as possible:  Vegetable juices that do not have pulp.  Fruit juices and fruit drinks that do not have pulp.  Coffee (regular or decaffeinated), tea, or soda at the discretion of your health care provider.  Clear bouillon, broth, or strained broth-based soups.  High-protein and flavored gelatins.  Sugar or honey.  Ices or frozen ice pops that do not contain milk. If you are not sure whether you can have certain items, you should ask your health care provider. You may also ask your health care provider if there are any other clear liquid options. Document Released: 05/24/2005 Document Revised: 05/29/2013 Document Reviewed: 04/20/2013 [facility] Patient Information 2015 [place], [place]. This information is not intended to  replace advice given to you by your health care provider. Make sure you discuss any questions you have with your health care provider.   Viral Gastroenteritis Viral gastroenteritis is also known as stomach flu. This condition affects the stomach and intestinal tract. It can cause sudden diarrhea and vomiting. The illness typically lasts 3 to 8 days. Most people develop an immune response that eventually gets rid of the virus. While this natural response develops, the virus can make you quite ill. CAUSES  Many different viruses can cause gastroenteritis, such as rotavirus or noroviruses. You can catch one of these viruses by consuming contaminated food or water. You may also catch a virus by sharing utensils or other personal items with an infected person or by touching a contaminated surface. SYMPTOMS  The most common symptoms are diarrhea and vomiting. These problems can cause a severe loss of body fluids (dehydration) and a body salt (electrolyte) imbalance. Other symptoms may include:  Fever.  Headache.  Fatigue.  Abdominal pain. DIAGNOSIS  Your caregiver can usually diagnose viral gastroenteritis based on your symptoms and a physical exam. A stool sample may also be taken to test for the presence of viruses or other infections. TREATMENT  This illness typically goes away on its own. Treatments are aimed at rehydration. The most serious cases of viral gastroenteritis involve vomiting so severely that you are not able to keep fluids down. In these cases, fluids must be given through an intravenous line (IV). HOME CARE INSTRUCTIONS   Drink enough fluids to keep your  urine clear or pale yellow. Drink small amounts of fluids frequently and increase the amounts as tolerated.  Ask your caregiver for specific rehydration instructions.  Avoid:  Foods high in sugar.  Alcohol.  Carbonated drinks.  Tobacco.  Juice.  Caffeine drinks.  Extremely hot or cold fluids.  Fatty, greasy  foods.  Too much intake of anything at one time.  Dairy products until 24 to 48 hours after diarrhea stops.  You may consume probiotics. Probiotics are active cultures of beneficial bacteria. They may lessen the amount and number of diarrheal stools in adults. Probiotics can be found in yogurt with active cultures and in supplements.  Wash your hands well to avoid spreading the virus.  Only take over-the-counter or prescription medicines for pain, discomfort, or fever as directed by your caregiver. Do not give aspirin to children. Antidiarrheal medicines are not recommended.  Ask your caregiver if you should continue to take your regular prescribed and over-the-counter medicines.  Keep all follow-up appointments as directed by your caregiver. SEEK IMMEDIATE MEDICAL CARE IF:   You are unable to keep fluids down.  You do not urinate at least once every 6 to 8 hours.  You develop shortness of breath.  You notice blood in your stool or vomit. This may look like coffee grounds.  You have abdominal pain that increases or is concentrated in one small area (localized).  You have persistent vomiting or diarrhea.  You have a fever.  The patient is a child younger than 3 months, and he or she has a fever.  The patient is a child older than 3 months, and he or she has a fever and persistent symptoms.  The patient is a child older than 3 months, and he or she has a fever and symptoms suddenly get worse.  The patient is a baby, and he or she has no tears when crying. MAKE SURE YOU:   Understand these instructions.  Will watch your condition.  Will get help right away if you are not doing well or get worse. Document Released: 05/24/2005 Document Revised: 08/16/2011 Document Reviewed: 03/10/2011 [facility] Patient Information 2015 [place]. This information is not intended to replace advice given to you by your health care provider. Make sure you discuss any questions you have with  your health care provider.

## 2014-09-19 NOTE — ED Provider Notes (Signed)
CSN: [id number]     Arrival date & time 09/19/14  0421 History   First MD Initiated Contact with Patient 09/19/14 0518     Chief Complaint  Patient presents with  . Abdominal Pain     (Consider location/radiation/quality/duration/timing/severity/associated sxs/prior Treatment) HPI Comments: Pt comes in with cc of abd pain, nausea, emesis, diarrhea. Pt is s/p cholecystectomy. Pt reports that the abd pain started on 24 hours ago. Pain is constant, sharp, epigastric and non radiating. Pt had similar pain with gall stones.  Pt has nausea with emesis diarrhea. 5+ episodes of emesis, and loose BM. Bilious emesis. Non bloody emesis or stools. No sick contacts, or suspicious po intake. PT has anorexia as well. Nausea is severe. No uti like sx. No drug use, no medical hx. Pt passing flatus.  Patient is a 24 y.o. female presenting with abdominal pain. The history is provided by the patient.  Abdominal Pain Associated symptoms: diarrhea, fatigue, nausea and vomiting   Associated symptoms: no chest pain, no dysuria, no fever and no shortness of breath     Past Medical History  Diagnosis Date  . Pregnancy with one fetus   . Gallstone   . Nexplanon insertion 01/01/2014    nexplanon inserted 01/01/14 left arm remove 01/01/17   Past Surgical History  Procedure Laterality Date  . No past surgeries    . Cholecystectomy  12/03/2011    Procedure: LAPAROSCOPIC CHOLECYSTECTOMY;  Surgeon: Dalia Heading, MD;  Location: [facility];  Service: General;  Laterality: N/A;   Family History  Problem Relation Age of Onset  . Anesthesia problems Neg Hx    History  Substance Use Topics  . Smoking status: Never Smoker   . Smokeless tobacco: Never Used  . Alcohol Use: No   OB History    Gravida Para Term Preterm AB TAB SAB Ectopic Multiple Living   Review of Systems  Constitutional: Positive for fatigue. Negative for fever.  Respiratory: Negative for shortness of breath.   Cardiovascular:  Negative for chest pain.  Gastrointestinal: Positive for nausea, vomiting, abdominal pain and diarrhea.  Genitourinary: Negative for dysuria.  Musculoskeletal: Negative for neck pain.  Neurological: Positive for dizziness, weakness and light-headedness. Negative for headaches.  All other systems reviewed and are negative.     Allergies  Review of patient's allergies indicates no known allergies.  Home Medications   Prior to Admission medications   Medication Sig Start Date End Date Taking? Authorizing Provider  etonogestrel (NEXPLANON) 68 MG IMPL implant Inject 1 each into the skin once.    Historical Provider, MD  ibuprofen (ADVIL,MOTRIN) 400 MG tablet Take 1 tablet (400 mg total) by mouth every 6 (six) hours as needed. 09/19/14   Derwood Kaplan, MD  promethazine (PHENERGAN) 25 MG suppository Place 1 suppository (25 mg total) rectally every 6 (six) hours as needed for nausea. 09/19/14   Derwood Kaplan, MD  promethazine (PHENERGAN) 25 MG tablet Take 1 tablet (25 mg total) by mouth every 6 (six) hours as needed for nausea. 09/19/14   Vue Pavon Rhunette Croft, MD   BP 102/62 mmHg  Pulse 94  Temp(Src) 98.1 F (36.7 C) (Oral)  Resp 14  Ht  (1.499 m)  Wt 145 lb (65.772 kg)  BMI 29.27 kg/m2  SpO2 100%  LMP 09/19/2014  Breastfeeding? No Physical Exam  Constitutional: She is oriented to person, place, and time. She appears well-developed and well-nourished.  HENT:  Head: Normocephalic and atraumatic.  Eyes: EOM are normal. Pupils are equal, round, and reactive to light.  Neck: Neck supple.  Cardiovascular: Normal rate, regular rhythm and normal heart sounds.   No murmur heard. Pulmonary/Chest: Effort normal. No respiratory distress.  Abdominal: Soft. She exhibits no distension. There is tenderness. There is no rebound and no guarding.  Epigastric tenderness only  Neurological: She is alert and oriented to person, place, and time.  Skin: Skin is warm and dry.  Nursing note and vitals  reviewed.   ED Course  Procedures (including critical care time) Labs Review Labs Reviewed  URINALYSIS, ROUTINE W REFLEX MICROSCOPIC - Abnormal; Notable for the following:    Specific Gravity, Urine >1.030 (*)    Ketones, ur 40 (*)    Protein, ur 30 (*)    All other components within normal limits  CBC WITH DIFFERENTIAL/PLATELET - Abnormal; Notable for the following:    WBC 15.3 (*)    Hemoglobin 15.2 (*)    Neutrophils Relative % 88 (*)    Neutro Abs 13.4 (*)    Lymphocytes Relative 8 (*)    All other components within normal limits  COMPREHENSIVE METABOLIC PANEL - Abnormal; Notable for the following:    Glucose, Bld 120 (*)    All other components within normal limits  URINE MICROSCOPIC-ADD ON - Abnormal; Notable for the following:    Squamous Epithelial / LPF FEW (*)    All other components within normal limits  LIPASE, BLOOD  PREGNANCY, URINE    Imaging Review Dg Abd Acute W/chest  09/19/2014   CLINICAL DATA:  abd pain, nausea, emesis, diarrhea x1day. Pain is constant, sharp, epigastric and non radiating. nausea with emesis diarrhea. 5+ episodes of emesis, and loose BM.  EXAM: DG ABDOMEN ACUTE W/ 1V CHEST  COMPARISON:  12/08/2011  FINDINGS: There is no bowel dilation to suggest obstruction. There are multiple small air-fluid levels in both colon and small bowel suggesting a gastroenteritis or mild diffuse adynamic ileus. There is no free air.  Status post cholecystectomy.  No evidence of renal or ureteral stones. Soft tissues otherwise unremarkable.  Normal heart, mediastinum and hila.  Clear lungs.  IMPRESSION: 1. No evidence of bowel obstruction or free air. 2. Findings suggest gastroenteritis or mild adynamic ileus. 3. Normal frontal chest radiograph.   Electronically Signed   By: Amie Portlandavid  Ormond M.D.   On: 09/19/2014 07:53     EKG Interpretation None     Labs reviewed. + leukocytosis and + ketonuria. Pt hydrated. Oral challenge started. Strict return precautions  discussed. We will treat this as gastro   MDM   Final diagnoses:  Epigastric pain  Gastroenteritis  Abdominal pain    DDx includes: Pancreatitis Hepatobiliary pathology including cholecystitis Gastritis/PUD SBO ACS syndrome Aortic Dissection  Pt comes in with epigastric pain, and emesis, diarrhea. She is s/p cholecystectomy. She has no true risk factors for PUD. Dissection, ACS is highly unlikely. Will get basic labs. No indication for imaging based on exam, vitals at this time.    Derwood KaplanAnkit Tyteanna Ost, MD 09/20/14 [phone]

## 2014-09-19 NOTE — ED Notes (Signed)
Pt reporting pain in upper abdomen, as well as n/v/d that started yesterday.

## 2015-02-04 ENCOUNTER — Other Ambulatory Visit (HOSPITAL_COMMUNITY): Payer: Self-pay | Admitting: Internal Medicine

## 2015-02-04 ENCOUNTER — Ambulatory Visit (HOSPITAL_COMMUNITY)
Admission: RE | Admit: 2015-02-04 | Discharge: 2015-02-04 | Disposition: A | Discharge status: Home / Self Care | Payer: Medicaid Other | Source: Ambulatory Visit | Attending: Internal Medicine | Admitting: Internal Medicine

## 2015-02-04 DIAGNOSIS — M25551 Pain in right hip: Secondary | ICD-10-CM | POA: Insufficient documentation

## 2015-02-04 DIAGNOSIS — R52 Pain, unspecified: Secondary | ICD-10-CM

## 2015-04-08 ENCOUNTER — Encounter (HOSPITAL_COMMUNITY): Payer: Self-pay | Admitting: Emergency Medicine

## 2015-04-08 ENCOUNTER — Emergency Department (HOSPITAL_COMMUNITY)
Admission: EM | Admit: 2015-04-08 | Discharge: 2015-04-08 | Disposition: A | Discharge status: Home / Self Care | Payer: Medicaid Other | Attending: Emergency Medicine | Admitting: Emergency Medicine

## 2015-04-08 DIAGNOSIS — M5431 Sciatica, right side: Secondary | ICD-10-CM | POA: Insufficient documentation

## 2015-04-08 DIAGNOSIS — G8929 Other chronic pain: Secondary | ICD-10-CM | POA: Insufficient documentation

## 2015-04-08 DIAGNOSIS — M545 Low back pain: Secondary | ICD-10-CM | POA: Insufficient documentation

## 2015-04-08 DIAGNOSIS — Z8719 Personal history of other diseases of the digestive system: Secondary | ICD-10-CM | POA: Insufficient documentation

## 2015-04-08 MED ORDER — HYDROCODONE-ACETAMINOPHEN 5-325 MG PO TABS: 1.0000 | ORAL_TABLET | Freq: Once | ORAL | Status: DC

## 2015-04-08 MED ORDER — CYCLOBENZAPRINE HCL 10 MG PO TABS: 10.0000 mg | ORAL_TABLET | Freq: Three times a day (TID) | ORAL | Status: DC | PRN

## 2015-04-08 MED ORDER — KETOROLAC TROMETHAMINE 60 MG/2ML IM SOLN
60.0000 mg | Freq: Once | INTRAMUSCULAR | Status: AC
  Administered 2015-04-08: 60 mg via INTRAMUSCULAR
  Filled 2015-04-08: qty 2

## 2015-04-08 MED ORDER — CYCLOBENZAPRINE HCL 10 MG PO TABS
10.0000 mg | ORAL_TABLET | Freq: Once | ORAL | Status: AC
  Administered 2015-04-08: 10 mg via ORAL
  Filled 2015-04-08: qty 1

## 2015-04-08 MED ORDER — HYDROCODONE-ACETAMINOPHEN 5-325 MG PO TABS: ORAL_TABLET | ORAL | Status: DC

## 2015-04-08 MED ORDER — PREDNISONE 10 MG PO TABS: ORAL_TABLET | ORAL | Status: DC

## 2015-04-08 NOTE — Discharge Instructions (Signed)
°  Sciatica °Sciatica is pain, weakness, numbness, or tingling along your sciatic nerve. The nerve starts in the lower back and runs down the back of each leg. Nerve damage or certain conditions pinch or put pressure on the sciatic nerve. This causes the pain, weakness, and other discomforts of sciatica. °HOME CARE  °· Only take medicine as told by your doctor. °· Apply ice to the affected area for 20 minutes. Do this 3-4 times a day for the first 48-72 hours. Then try heat in the same way. °· Exercise, stretch, or do your usual activities if these do not make your pain worse. °· Go to physical therapy as told by your doctor. °· Keep all doctor visits as told. °· Do not wear high heels or shoes that are not supportive. °· Get a firm mattress if your mattress is too soft to lessen pain and discomfort. °GET HELP RIGHT AWAY IF:  °· You cannot control when you poop (bowel movement) or pee (urinate). °· You have more weakness in your lower back, lower belly (pelvis), butt (buttocks), or legs. °· You have redness or puffiness (swelling) of your back. °· You have a burning feeling when you pee. °· You have pain that gets worse when you lie down. °· You have pain that wakes you from your sleep. °· Your pain is worse than past pain. °· Your pain lasts longer than 4 weeks. °· You are suddenly losing weight without reason. °MAKE SURE YOU:  °· Understand these instructions. °· Will watch this condition. °· Will get help right away if you are not doing well or get worse. °  °This information is not intended to replace advice given to you by your health care provider. Make sure you discuss any questions you have with your health care provider. °  °Document Released: 03/02/2008 Document Revised: 02/12/2015 Document Reviewed: 10/03/2011 °Elsevier Interactive Patient Education ©2016 [employer]. ° ° °

## 2015-04-08 NOTE — ED Notes (Signed)
Patient given discharge instruction, verbalized understand. Patient ambulatory out of the department.  

## 2015-04-08 NOTE — ED Provider Notes (Signed)
CSN: [id number]     Arrival date & time 04/08/15  2031 History   First MD Initiated Contact with Patient 04/08/15 2036     Chief Complaint  Patient presents with  . Hip Pain     (Consider location/radiation/quality/duration/timing/severity/associated sxs/prior Treatment) HPI   Linda Pollard is a 24 y.o. female who presents to the Emergency Department complaining of worsening of her chronic right hip pain.  She states that she has had pain to her right back, hip and leg for "some time" and reports onset after giving birth to her child.  Today, she states the pain has worsened and intermittent radiates from her back and hip into the front of her thigh and stops at her knee.  She describes the pain as sharp and worse with sitting and standing.  She denies numbness or weakness of the extremity, urine or bowel changes, fever, abdominal pain, nausea or vomiting. She states that she has seen her PMD for this and had an Xray of her hip earlier this year without known diagnosis.      Past Medical History  Diagnosis Date  . Pregnancy with one fetus   . Gallstone   . Nexplanon insertion 01/01/2014    nexplanon inserted 01/01/14 left arm remove 01/01/17   Past Surgical History  Procedure Laterality Date  . No past surgeries    . Cholecystectomy  12/03/2011    Procedure: LAPAROSCOPIC CHOLECYSTECTOMY;  Surgeon: Dalia Heading, MD;  Location: [facility];  Service: General;  Laterality: N/A;   Family History  Problem Relation Age of Onset  . Anesthesia problems Neg Hx    Social History  Substance Use Topics  . Smoking status: Never Smoker   . Smokeless tobacco: Never Used  . Alcohol Use: No   OB History    Gravida Para Term Preterm AB TAB SAB Ectopic Multiple Living   Review of Systems  Gastrointestinal: Negative for nausea, vomiting and abdominal pain.  Genitourinary: Negative for dysuria, frequency, flank pain, vaginal bleeding and vaginal discharge.  Musculoskeletal:  Positive for back pain and arthralgias (right hip pain). Negative for joint swelling.  Neurological: Negative for dizziness and weakness.  All other systems reviewed and are negative.     Allergies  Review of patient's allergies indicates no known allergies.  Home Medications   Prior to Admission medications   Medication Sig Start Date End Date Taking? Authorizing Provider  etonogestrel (NEXPLANON) 68 MG IMPL implant Inject 1 each into the skin once.    Historical Provider, MD  ibuprofen (ADVIL,MOTRIN) 400 MG tablet Take 1 tablet (400 mg total) by mouth every 6 (six) hours as needed. 09/19/14   Derwood Kaplan, MD  promethazine (PHENERGAN) 25 MG suppository Place 1 suppository (25 mg total) rectally every 6 (six) hours as needed for nausea. 09/19/14   Derwood Kaplan, MD  promethazine (PHENERGAN) 25 MG tablet Take 1 tablet (25 mg total) by mouth every 6 (six) hours as needed for nausea. 09/19/14   Ankit Nanavati, MD   BP 120/89 mmHg  Pulse 98  Temp(Src) 99.1 F (37.3 C) (Oral)  Resp 20  Ht  (1.499 m)  Wt 150 lb (68.04 kg)  BMI 30.28 kg/m2  SpO2 100% Physical Exam  Constitutional: She is oriented to person, place, and time. She appears well-developed and well-nourished. No distress.  HENT:  Head: Normocephalic and atraumatic.  Neck: Normal range of motion. Neck supple.  Cardiovascular:  Normal rate, regular rhythm, normal heart sounds and intact distal pulses.   No murmur heard. Pulmonary/Chest: Effort normal and breath sounds normal. No respiratory distress.  Abdominal: Soft. She exhibits no distension. There is no tenderness.  Musculoskeletal: She exhibits tenderness. She exhibits no edema.       Lumbar back: She exhibits tenderness and pain. She exhibits normal range of motion, no swelling, no deformity, no laceration and normal pulse.  ttp of the right lumbar paraspinal muscles and SI joint.  No spinal tenderness.  DP pulses are brisk and symmetrical.  Distal sensation  intact.    Pt has 5/5 strength against resistance of bilateral lower extremities.     Neurological: She is alert and oriented to person, place, and time. She has normal strength. No sensory deficit. She exhibits normal muscle tone. Coordination and gait normal.  Reflex Scores:      Patellar reflexes are 2+ on the right side and 2+ on the left side.      Achilles reflexes are 2+ on the right side and 2+ on the left side. Skin: Skin is warm and dry. No rash noted.  Nursing note and vitals reviewed.   ED Course  Procedures (including critical care time)   MDM   Final diagnoses:  Sciatica of right side    Pt had plain film of the right hip and pelvis on 02/04/15 by PMD that was unremarkable.  Considered in my MDM  Pt is uncomfortable appearing, but non-toxic.  She ambulates with slightly antalgic gait.  Reports this as a recurrent problem.  Positive SLR on right.  No focal neuro deficits on exam.  No concerning sx's for emergent neurological or infectious process. Likely sciatica.    I have treated with IM toradol and po flexeril.  Pain improved.  She appears stable for d/c and tx plan which includes vicodin, flexeril and prednisone taper.     Pauline Ausammy Matthew Pais, PA-C 04/09/15 0049  Bethann BerkshireJoseph Zammit, MD 04/11/15 [phone]

## 2015-04-08 NOTE — ED Notes (Signed)
Pt c/o rt hip pain that radiates down the rt leg.

## 2017-05-27 ENCOUNTER — Emergency Department (HOSPITAL_COMMUNITY)
Admission: EM | Admit: 2017-05-27 | Discharge: 2017-05-27 | Disposition: A | Discharge status: Home / Self Care | Payer: Self-pay | Attending: Emergency Medicine | Admitting: Emergency Medicine

## 2017-05-27 ENCOUNTER — Other Ambulatory Visit: Payer: Self-pay

## 2017-05-27 ENCOUNTER — Encounter (HOSPITAL_COMMUNITY): Payer: Self-pay | Admitting: Emergency Medicine

## 2017-05-27 DIAGNOSIS — R110 Nausea: Secondary | ICD-10-CM

## 2017-05-27 DIAGNOSIS — R1013 Epigastric pain: Secondary | ICD-10-CM

## 2017-05-27 DIAGNOSIS — R112 Nausea with vomiting, unspecified: Secondary | ICD-10-CM | POA: Insufficient documentation

## 2017-05-27 LAB — CBC WITH DIFFERENTIAL/PLATELET
Basophils Absolute: 0 10*3/uL (ref 0.0–0.1)
Basophils Relative: 0 %
Eosinophils Absolute: 0.1 10*3/uL (ref 0.0–0.7)
Eosinophils Relative: 1 %
HEMATOCRIT: 43.1 % (ref 36.0–46.0)
Hemoglobin: 14.2 g/dL (ref 12.0–15.0)
LYMPHS PCT: 25 %
Lymphs Abs: 2.3 10*3/uL (ref 0.7–4.0)
MCH: 29.9 pg (ref 26.0–34.0)
MCHC: 32.9 g/dL (ref 30.0–36.0)
MCV: 90.7 fL (ref 78.0–100.0)
MONO ABS: 0.7 10*3/uL (ref 0.1–1.0)
MONOS PCT: 8 %
NEUTROS ABS: 6 10*3/uL (ref 1.7–7.7)
Neutrophils Relative %: 66 %
Platelets: 257 10*3/uL (ref 150–400)
RBC: 4.75 MIL/uL (ref 3.87–5.11)
RDW: 13.3 % (ref 11.5–15.5)
WBC: 9.1 10*3/uL (ref 4.0–10.5)

## 2017-05-27 LAB — COMPREHENSIVE METABOLIC PANEL
ALBUMIN: 4.4 g/dL (ref 3.5–5.0)
ALT: 21 U/L (ref 14–54)
AST: 17 U/L (ref 15–41)
Alkaline Phosphatase: 76 U/L (ref 38–126)
Anion gap: 11 (ref 5–15)
BILIRUBIN TOTAL: 0.5 mg/dL (ref 0.3–1.2)
BUN: 12 mg/dL (ref 6–20)
CHLORIDE: 106 mmol/L (ref 101–111)
CO2: 22 mmol/L (ref 22–32)
Calcium: 9 mg/dL (ref 8.9–10.3)
Creatinine, Ser: 0.68 mg/dL (ref 0.44–1.00)
GFR calc Af Amer: >60 mL/min (ref >60)
GFR calc non Af Amer: >60 mL/min (ref >60)
GLUCOSE: 107 mg/dL — AB (ref 65–99)
POTASSIUM: 3.3 mmol/L — AB (ref 3.5–5.1)
Sodium: 139 mmol/L (ref 135–145)
Total Protein: 7.9 g/dL (ref 6.5–8.1)

## 2017-05-27 LAB — LIPASE, BLOOD: Lipase: 16 U/L (ref 11–51)

## 2017-05-27 LAB — I-STAT BETA HCG BLOOD, ED (MC, WL, AP ONLY): I-stat hCG, quantitative: <5 m[IU]/mL (ref <5)

## 2017-05-27 MED ORDER — ONDANSETRON 4 MG PO TBDP
4.0000 mg | ORAL_TABLET | Freq: Once | ORAL | Status: AC
  Administered 2017-05-27: 4 mg via ORAL
  Filled 2017-05-27: qty 1

## 2017-05-27 MED ORDER — ONDANSETRON 4 MG PO TBDP: 4.0000 mg | ORAL_TABLET | Freq: Three times a day (TID) | ORAL | 0 refills | Status: DC | PRN

## 2017-05-27 MED ORDER — SUCRALFATE 1 GM/10ML PO SUSP: 1.0000 g | Freq: Three times a day (TID) | ORAL | 0 refills | Status: DC

## 2017-05-27 MED ORDER — SODIUM CHLORIDE 0.9 % IV BOLUS (SEPSIS)
1000.0000 mL | Freq: Once | INTRAVENOUS | Status: AC
  Administered 2017-05-27: 1000 mL via INTRAVENOUS

## 2017-05-27 MED ORDER — GI COCKTAIL ~~LOC~~
30.0000 mL | Freq: Once | ORAL | Status: AC
  Administered 2017-05-27: 30 mL via ORAL
  Filled 2017-05-27: qty 30

## 2017-05-27 NOTE — Discharge Instructions (Signed)
As discussed, your evaluation today has been largely reassuring.  But, it is important that you monitor your condition carefully, and do not hesitate to return to the ED if you develop new, or concerning changes in your condition.  Please continue to take the omeprazole daily. Please use the additional prescribed medication for additional relief.  Otherwise, please follow-up with your physician for appropriate ongoing care.

## 2017-05-27 NOTE — ED Triage Notes (Signed)
Pt reports N/V/D since Weds Emesis x 2 today with several bouts of D has taken no meds No flu shot Pt smiling w moist mucous membranes Followed by health dept  has not seen

## 2017-05-27 NOTE — ED Provider Notes (Signed)
[facility] EMERGENCY DEPARTMENT Provider Note   CSN: [id number] Arrival date & time: 05/27/17  1157     History   Chief Complaint Chief Complaint  Patient presents with  . Nausea    HPI Linda Pollard is a 26 y.o. female.  HPI Concern of epigastric pain, nausea, vomiting, diarrhea. Onset was 3 days ago.  Since onset symptoms have been persistent. The pain is burning, sharp, with associated bloating sensation. After the pain began she began taking omeprazole, has had 3 doses. No medication taken for pain relief. Patient was well prior to the onset of symptoms, does not smoke, does not drink. She does have a notable history of prior cholecystectomy. She states that the pain is similar to that which she experienced prior to the procedure.  Past Medical History:  Diagnosis Date  . Gallstone   . Nexplanon insertion 01/01/2014   nexplanon inserted 01/01/14 left arm remove 01/01/17  . Pregnancy with one fetus     Patient Active Problem List   Diagnosis Date Noted  . Nexplanon insertion 01/01/2014  . Supervision of other normal pregnancy 04/30/2013    Past Surgical History:  Procedure Laterality Date  . CHOLECYSTECTOMY  12/03/2011   Procedure: LAPAROSCOPIC CHOLECYSTECTOMY;  Surgeon: Dalia HeadingMark A Jenkins, MD;  Location: [facility];  Service: General;  Laterality: N/A;  . NO PAST SURGERIES      OB History    Gravida Para Term Preterm AB Living   2 2 2     2    SAB TAB Ectopic Multiple Live Births           2       Home Medications    Prior to Admission medications   Medication Sig Start Date End Date Taking? Authorizing Provider  cyclobenzaprine (FLEXERIL) 10 MG tablet Take 1 tablet (10 mg total) by mouth 3 (three) times daily as needed. 04/08/15   Triplett, Tammy, PA-C  etonogestrel (NEXPLANON) 68 MG IMPL implant Inject 1 each into the skin once.    [provider]  HYDROcodone-acetaminophen (NORCO/VICODIN) 5-325 MG tablet Take one tab po q 4-6 hrs prn pain  04/08/15   Triplett, Tammy, PA-C  ibuprofen (ADVIL,MOTRIN) 400 MG tablet Take 1 tablet (400 mg total) by mouth every 6 (six) hours as needed. 09/19/14   Derwood KaplanNanavati, Ankit, MD  predniSONE (DELTASONE) 10 MG tablet Take 6 tablets day one, 5 tablets day two, 4 tablets day three, 3 tablets day four, 2 tablets day five, then 1 tablet day six 04/08/15   Triplett, Tammy, PA-C  promethazine (PHENERGAN) 25 MG suppository Place 1 suppository (25 mg total) rectally every 6 (six) hours as needed for nausea. 09/19/14   Derwood KaplanNanavati, Ankit, MD  promethazine (PHENERGAN) 25 MG tablet Take 1 tablet (25 mg total) by mouth every 6 (six) hours as needed for nausea. 09/19/14   Derwood KaplanNanavati, Ankit, MD    Family History Family History  Problem Relation Age of Onset  . Anesthesia problems Neg Hx     Social History Social History   Tobacco Use  . Smoking status: Never Smoker  . Smokeless tobacco: Never Used  Substance Use Topics  . Alcohol use: No  . Drug use: No     Allergies   Patient has no known allergies.   Review of Systems Review of Systems  Constitutional:       Per HPI, otherwise negative  HENT:       Per HPI, otherwise negative  Respiratory:       Per  HPI, otherwise negative  Cardiovascular:       Per HPI, otherwise negative  Gastrointestinal: Positive for abdominal pain, diarrhea, nausea and vomiting.  Endocrine:       Negative aside from HPI  Genitourinary:       Neg aside from HPI   Musculoskeletal:       Per HPI, otherwise negative  Skin: Negative.   Neurological: Negative for syncope.     Physical Exam Updated Vital Signs BP 129/80 (BP Location: Right Arm)   Pulse (!) 107   Temp 98.2 F (36.8 C) (Oral)   Resp 18   Ht 4\' 11"  (1.499 m)   Wt 77.1 kg (170 lb)   SpO2 100%   BMI 34.34 kg/m   Physical Exam  Constitutional: She is oriented to person, place, and time. She appears well-developed and well-nourished. No distress.  HENT:  Head: Normocephalic and atraumatic.  Eyes:  Conjunctivae and EOM are normal.  Cardiovascular: Normal rate and regular rhythm.  Pulmonary/Chest: Effort normal and breath sounds normal. No stridor. No respiratory distress.  Abdominal: She exhibits no distension. There is tenderness.  Tenderness in the epigastrium, otherwise unremarkable abdominal exam  Musculoskeletal: She exhibits no edema.  Neurological: She is alert and oriented to person, place, and time. No cranial nerve deficit.  Skin: Skin is warm and dry.  Psychiatric: She has a normal mood and affect.  Nursing note and vitals reviewed.    ED Treatments / Results  Labs (all labs ordered are listed, but only abnormal results are displayed) Labs Reviewed  COMPREHENSIVE METABOLIC PANEL - Abnormal; Notable for the following components:      Result Value   Potassium 3.3 (*)    Glucose, Bld 107 (*)    All other components within normal limits  LIPASE, BLOOD  CBC WITH DIFFERENTIAL/PLATELET  I-STAT BETA HCG BLOOD, ED ([facility], [facility], [facility] ONLY)    Procedures Procedures (including critical care time)  Medications Ordered in ED Medications  sodium chloride 0.9 % bolus 1,000 mL (1,000 mLs Intravenous New Bag/Given 05/27/17 1304)  gi cocktail (Maalox,Lidocaine,Donnatal) (30 mLs Oral Given 05/27/17 1304)     Initial Impression / Assessment and Plan / ED Course  I have reviewed the triage vital signs and the nursing notes.  Pertinent labs & imaging results that were available during my care of the patient were reviewed by me and considered in my medical decision making (see chart for details).  2:50 PM Patient awake alert, smiling, in no distress. Symptoms have improved, though not resolved entirely. We discussed all findings, reassuring lab results, suspicion for gastric etiology for her symptoms. With otherwise reassuring findings, no evidence for peritonitis, no evidence for other hepatic or renal dysfunction, and with reassuring vital signs, patient is appropriate for  initiation of new medication regimen, follow-up with GI. Patient amenable to this.  Final Clinical Impressions(s) / ED Diagnoses  Abdominal pain, nausea and vomiting.   Gerhard MunchLockwood, Tayten Heber, MD 05/27/17 [phone]

## 2017-08-09 ENCOUNTER — Emergency Department (HOSPITAL_COMMUNITY)
Admission: EM | Admit: 2017-08-09 | Discharge: 2017-08-09 | Disposition: A | Discharge status: Home / Self Care | Payer: Self-pay | Attending: Emergency Medicine | Admitting: Emergency Medicine

## 2017-08-09 ENCOUNTER — Encounter (HOSPITAL_COMMUNITY): Payer: Self-pay | Admitting: Emergency Medicine

## 2017-08-09 ENCOUNTER — Other Ambulatory Visit: Payer: Self-pay

## 2017-08-09 ENCOUNTER — Emergency Department (HOSPITAL_COMMUNITY): Payer: Self-pay

## 2017-08-09 DIAGNOSIS — R0609 Other forms of dyspnea: Secondary | ICD-10-CM | POA: Insufficient documentation

## 2017-08-09 DIAGNOSIS — R05 Cough: Secondary | ICD-10-CM | POA: Insufficient documentation

## 2017-08-09 DIAGNOSIS — B9789 Other viral agents as the cause of diseases classified elsewhere: Secondary | ICD-10-CM | POA: Insufficient documentation

## 2017-08-09 DIAGNOSIS — R0981 Nasal congestion: Secondary | ICD-10-CM | POA: Insufficient documentation

## 2017-08-09 DIAGNOSIS — R0789 Other chest pain: Secondary | ICD-10-CM

## 2017-08-09 DIAGNOSIS — Z79899 Other long term (current) drug therapy: Secondary | ICD-10-CM | POA: Insufficient documentation

## 2017-08-09 DIAGNOSIS — J069 Acute upper respiratory infection, unspecified: Secondary | ICD-10-CM | POA: Insufficient documentation

## 2017-08-09 LAB — CBC WITH DIFFERENTIAL/PLATELET
Basophils Absolute: 0 10*3/uL (ref 0.0–0.1)
Basophils Relative: 0 %
EOS ABS: 0.1 10*3/uL (ref 0.0–0.7)
EOS PCT: 1 %
HCT: 44.2 % (ref 36.0–46.0)
Hemoglobin: 14.5 g/dL (ref 12.0–15.0)
LYMPHS ABS: 4.8 10*3/uL — AB (ref 0.7–4.0)
LYMPHS PCT: 34 %
MCH: 30 pg (ref 26.0–34.0)
MCHC: 32.8 g/dL (ref 30.0–36.0)
MCV: 91.3 fL (ref 78.0–100.0)
MONOS PCT: 7 %
Monocytes Absolute: 1 10*3/uL (ref 0.1–1.0)
Neutro Abs: 8.1 10*3/uL — ABNORMAL HIGH (ref 1.7–7.7)
Neutrophils Relative %: 58 %
PLATELETS: 260 10*3/uL (ref 150–400)
RBC: 4.84 MIL/uL (ref 3.87–5.11)
RDW: 13.4 % (ref 11.5–15.5)
WBC: 14.1 10*3/uL — ABNORMAL HIGH (ref 4.0–10.5)

## 2017-08-09 LAB — BASIC METABOLIC PANEL
Anion gap: 9 (ref 5–15)
BUN: 12 mg/dL (ref 6–20)
CHLORIDE: 105 mmol/L (ref 101–111)
CO2: 27 mmol/L (ref 22–32)
CREATININE: 0.68 mg/dL (ref 0.44–1.00)
Calcium: 9.2 mg/dL (ref 8.9–10.3)
GFR calc Af Amer: >60 mL/min (ref >60)
GLUCOSE: 90 mg/dL (ref 65–99)
POTASSIUM: 3.9 mmol/L (ref 3.5–5.1)
SODIUM: 141 mmol/L (ref 135–145)

## 2017-08-09 LAB — I-STAT BETA HCG BLOOD, ED (MC, WL, AP ONLY)

## 2017-08-09 MED ORDER — FLUTICASONE PROPIONATE 50 MCG/ACT NA SUSP: 2.0000 | Freq: Every day | NASAL | 0 refills | Status: DC

## 2017-08-09 MED ORDER — BENZONATATE 100 MG PO CAPS: 100.0000 mg | ORAL_CAPSULE | Freq: Three times a day (TID) | ORAL | 0 refills | Status: DC | PRN

## 2017-08-09 NOTE — ED Triage Notes (Signed)
Pt c/o right side rib cage for the past 2 weeks, pt was on Abx for bronchitis last week and now she has this side pain mostly on the side. No fever or chills.

## 2017-08-09 NOTE — Discharge Instructions (Signed)
Drink plenty of water and get plenty of rest. Take flonase to decrease nasal congestion.  Over-the-counter allergy medication for nasal congestion and scratchy throat. Alternate 600 mg of ibuprofen and [phone] mg of Tylenol every 3 hours as needed for pain. Do not exceed 4000 mg of Tylenol daily.  Apply heating pad to the chest wall for 20 minutes at a time for comfort.  You may also take hot showers and hot baths to relax muscles and do some gentle stretching throughout the day.   Followup with your primary care doctor in 3-5 days for recheck of ongoing symptoms. Return to emergency department for emergent changing or worsening of symptoms such as throat tightness, facial swelling, fever not controlled by ibuprofen or Tylenol,difficulty breathing, or chest pain.

## 2017-08-09 NOTE — ED Provider Notes (Signed)
[facility] EMERGENCY DEPARTMENT Provider Note   CSN: [id number] Arrival date & time: 08/09/17  2021     History   Chief Complaint Chief Complaint  Patient presents with  . right side rib cage pain    HPI Linda Pollard is a 27 y.o. female with history of gallstones presents for evaluation of gradual onset, progressively worsening cough for approximately 4 weeks.  Cough is intermittently productive of clear-green sputum.  She states that approximately 1 week after development of cough she began experiencing primarily right-sided lateral chest wall pain which is a constant ache but worsens with certain movements, coughing, laughing, and palpation.  She denies any shortness of breath at rest but notes dyspnea on exertion such as when she is picking up her children and going upstairs.  She is a non-smoker.  No recent travel or surgeries, no hemoptysis, no prior history of DVT or PEs.  She does have Nexplanon arm implant which is progesterone based.  She was seen at the health department 2 weeks ago and diagnosed with a bronchitis.  She was discharged with azithromycin and prednisone without significant improvement in her symptoms.  She has been taking 800 mg of ibuprofen once or twice daily without significant relief.  She also endorses nasal congestion but denies sore throat.  The history is provided by the patient.    Past Medical History:  Diagnosis Date  . Gallstone   . Nexplanon insertion 01/01/2014   nexplanon inserted 01/01/14 left arm remove 01/01/17  . Pregnancy with one fetus     Patient Active Problem List   Diagnosis Date Noted  . Nexplanon insertion 01/01/2014  . Supervision of other normal pregnancy 04/30/2013    Past Surgical History:  Procedure Laterality Date  . CHOLECYSTECTOMY  12/03/2011   Procedure: LAPAROSCOPIC CHOLECYSTECTOMY;  Surgeon: Dalia Heading, MD;  Location: [facility];  Service: General;  Laterality: N/A;  . NO PAST SURGERIES      OB  History    Gravida Para Term Preterm AB Living   2 2 2     2    SAB TAB Ectopic Multiple Live Births           2       Home Medications    Prior to Admission medications   Medication Sig Start Date End Date Taking? Authorizing Provider  benzonatate (TESSALON) 100 MG capsule Take 1 capsule (100 mg total) by mouth 3 (three) times daily as needed for cough. 08/09/17   Ada Holness A, PA-C  etonogestrel (NEXPLANON) 68 MG IMPL implant Inject 1 each into the skin once.    [provider]  fluticasone (FLONASE) 50 MCG/ACT nasal spray Place 2 sprays into both nostrils daily. 08/09/17   Luevenia Maxin, Dominga Mcduffie A, PA-C  ondansetron (ZOFRAN ODT) 4 MG disintegrating tablet Take 1 tablet (4 mg total) by mouth every 8 (eight) hours as needed for nausea or vomiting. 05/27/17   Gerhard Munch, MD  sucralfate (CARAFATE) 1 GM/10ML suspension Take 10 mLs (1 g total) by mouth 4 (four) times daily -  with meals and at bedtime. 05/27/17   Gerhard Munch, MD    Family History Family History  Problem Relation Age of Onset  . Anesthesia problems Neg Hx     Social History Social History   Tobacco Use  . Smoking status: Never Smoker  . Smokeless tobacco: Never Used  Substance Use Topics  . Alcohol use: No  . Drug use: No     Allergies  Patient has no known allergies.   Review of Systems Review of Systems  Constitutional: Negative for chills and fever.  HENT: Positive for congestion. Negative for sore throat.   Respiratory: Positive for cough and shortness of breath.   Cardiovascular: Positive for chest pain. Negative for palpitations and leg swelling.  Gastrointestinal: Negative for abdominal pain, nausea and vomiting.  All other systems reviewed and are negative.    Physical Exam Updated Vital Signs BP 116/62   Pulse 90   Temp 98.3 F (36.8 C) (Oral)   Resp 18   Ht 4\' 11"  (1.499 m)   Wt 79.4 kg (175 lb)   SpO2 97%   BMI 35.35 kg/m   Physical Exam  Constitutional: She appears  well-developed and well-nourished. No distress.  HENT:  Head: Normocephalic and atraumatic.  Right Ear: Tympanic membrane, external ear and ear canal normal. Tympanic membrane is not perforated, not erythematous and not bulging.  Left Ear: Tympanic membrane, external ear and ear canal normal. Tympanic membrane is not perforated, not erythematous and not bulging.  Nose: Mucosal edema present. No septal deviation or nasal septal hematoma. Right sinus exhibits no maxillary sinus tenderness and no frontal sinus tenderness. Left sinus exhibits no maxillary sinus tenderness and no frontal sinus tenderness.  Mouth/Throat: Uvula is midline, oropharynx is clear and moist and mucous membranes are normal. No tonsillar exudate.  Postnasal drip noted.  No trismus or sublingual abnormalities.  Eyes: Conjunctivae are normal. Right eye exhibits no discharge. Left eye exhibits no discharge.  Neck: Normal range of motion and full passive range of motion without pain. Neck supple. No JVD present. No tracheal deviation present.  Cardiovascular: Normal rate, regular rhythm, normal heart sounds and intact distal pulses.  2+ radial and DP/PT pulses bl, negative Homan's bl, no LE edema, no calf tenderness  Pulmonary/Chest: Effort normal and breath sounds normal. No stridor. No respiratory distress. She has no wheezes. She has no rales.  Abdominal: She exhibits no distension.  Musculoskeletal: She exhibits no edema.  Neurological: She is alert.  Skin: Skin is warm and dry. No erythema.  Psychiatric: She has a normal mood and affect. Her behavior is normal.  Nursing note and vitals reviewed.    ED Treatments / Results  Labs (all labs ordered are listed, but only abnormal results are displayed) Labs Reviewed  CBC WITH DIFFERENTIAL/PLATELET - Abnormal; Notable for the following components:      Result Value   WBC 14.1 (*)    Neutro Abs 8.1 (*)    Lymphs Abs 4.8 (*)    All other components within normal limits    BASIC METABOLIC PANEL  I-STAT BETA HCG BLOOD, ED ([facility], [facility], [facility] ONLY)    EKG  EKG Interpretation None       Radiology Dg Chest 2 View  Result Date: 08/09/2017 CLINICAL DATA:  Pain below the right breast EXAM: CHEST - 2 VIEW COMPARISON:  09/19/2014 FINDINGS: The heart size and mediastinal contours are within normal limits. Both lungs are clear. The visualized skeletal structures are unremarkable. IMPRESSION: No active cardiopulmonary disease. Electronically Signed   By: Jasmine Pang M.D.   On: 08/09/2017 21:50    Procedures Procedures (including critical care time)  Medications Ordered in ED Medications - No data to display   Initial Impression / Assessment and Plan / ED Course  I have reviewed the triage vital signs and the nursing notes.  Pertinent labs & imaging results that were available during my care of the patient were  reviewed by me and considered in my medical decision making (see chart for details).     Patient presents with several weeks of productive cough, nasal congestion, and right lateral chest wall pain reproducible on palpation.  She is afebrile, initially very mildly tachycardic with resolution on reevaluation. Also not tachycardic on my assessment. She is not tachypneic or hypoxic, exhibits no increased work of breathing.  Chest x-ray shows no evidence of pneumonia, pleural effusion, bronchitis, or pneumothorax.  she has a leukocytosis but her lab work is otherwise reassuring with no significant electrolyte abnormalities.  I highly doubt PE in the absence of persistent shortness of breath, hypoxia, tachypnea, or persistent tachycardia.  She is also extremely low risk for cardiac events.  History and physical examination is more consistent with viral URI with cough and musculoskeletal chest wall pain secondary to persistent cough.  Will discharge with symptom medic treatment.  Recommend follow-up with primary care physician for reevaluation of symptoms.  Discussed  indications for return to the ED. Patient and patient's husbandverbalized understanding of and agreement with plan and is safe for discharge home at this time.  She has no complaints prior to discharge.   Final Clinical Impressions(s) / ED Diagnoses   Final diagnoses:  Viral URI with cough  Chest wall pain    ED Discharge Orders        Ordered    benzonatate (TESSALON) 100 MG capsule  3 times daily PRN     08/09/17 2334    fluticasone (FLONASE) 50 MCG/ACT nasal spray  Daily     08/09/17 2334       Jeanie SewerFawze, Brendaliz Kuk A, PA-C 08/10/17 0116    Zadie RhineWickline, Donald, MD 08/10/17 [phone]

## 2018-12-15 DIAGNOSIS — Z139 Encounter for screening, unspecified: Secondary | ICD-10-CM

## 2018-12-15 LAB — GLUCOSE, POCT (MANUAL RESULT ENTRY): POC Glucose: 99 mg/dl (ref 70–99)

## 2018-12-15 NOTE — Congregational Nurse Program (Signed)
  Dept: [phone]   Congregational Nurse Program Note  Date of Encounter: 12/15/2018  Past Medical History: Past Medical History:  Diagnosis Date  . Gallstone   . Nexplanon insertion 01/01/2014   nexplanon inserted 01/01/14 left arm remove 01/01/17  . Pregnancy with one fetus     Encounter Details: CNP Questionnaire - 12/15/18 1445      Questionnaire   Patient Status  Not Applicable    Race  Hispanic or Latino    Location Patient Served At  [facility]  Not Applicable    Uninsured  Uninsured (NEW 1x/quarter)    Food  No food insecurities    Housing/Utilities  Yes, have permanent housing    Transportation  No transportation needs    Interpersonal Safety  Yes, feel physically and emotionally safe where you currently live    Medication  No medication insecurities    Medical Provider  Yes    Referrals  Medication Assistance;[facility];Primary Care Provider/Clinic    ED Visit Averted  Not Applicable    Life-Saving Intervention Made  Not Applicable       Client is being enrolled in [facility] and was into [facility] today to bring more documents for her enrollment. Client requests blood pressure and glucose screening while here.  Client [facility] the [facility] checkups for Birth Control with Nexplanon. She has not had any recent labs or screenings.  Client is married with two children and now with a niece who is a teenager living with them who is here for political asylum since December. Client reports increased stress. She does work however due to [employer] she has not been working at [employer] for children.  Client is Alert and oriented to person place and time. She denies any chest pains or shortness of breath. She is a nonsmoker. She does report that her arms fall asleep after sleeping. Denies excessive thirst, no frequent urination. She does report occasional acid reflux with greasy foods after having her  gallbladder removed in 2013. Last eye exam was recent and she complains of headaches while adjusting to her new prescription.  Current Medications Citalopram  Blood pressure today 114/76, pulse 95 pulse oximetry 99%  Non fasting glucose 1 hour post prandial 99  Client has established care at [facility] for family planning and will establish primary care there also.  Encouraged client to get more exercise such as walking  Increase water intake Tips to reduce stress Wear a mask, good handwashing and social distancing.  Will follow for Case Management for [facility].  Debria Garret RN

## 2019-05-22 ENCOUNTER — Other Ambulatory Visit: Payer: Self-pay

## 2019-05-22 ENCOUNTER — Ambulatory Visit: Payer: HRSA Program | Attending: Internal Medicine

## 2019-05-22 DIAGNOSIS — U071 COVID-19: Secondary | ICD-10-CM | POA: Diagnosis not present

## 2019-05-22 DIAGNOSIS — Z20822 Contact with and (suspected) exposure to covid-19: Secondary | ICD-10-CM

## 2019-05-22 DIAGNOSIS — Z20828 Contact with and (suspected) exposure to other viral communicable diseases: Secondary | ICD-10-CM | POA: Diagnosis present

## 2019-05-23 LAB — NOVEL CORONAVIRUS, NAA: SARS-CoV-2, NAA: DETECTED — AB

## 2019-05-24 ENCOUNTER — Telehealth: Payer: Self-pay

## 2019-05-24 NOTE — Progress Notes (Signed)
Order(s) created erroneously. Erroneous order ID: [id number]  Order moved by: Brigitte Pulse  Order move date/time: 05/24/2019 11:01 AM  Source Patient: [id number]  Source Contact: 05/22/2019  Destination Patient: [id number]  Destination Contact: 05/22/2019

## 2019-05-24 NOTE — Progress Notes (Signed)
Order(s) created erroneously. Erroneous order ID: [id number]  Order moved by: Hollace Michelli M  Order move date/time: 05/24/2019 11:01 AM  Source Patient: [id number]  Source Contact: 05/22/2019  Destination Patient: [id number]  Destination Contact: 05/22/2019 

## 2019-05-24 NOTE — Progress Notes (Signed)
Moving orders to this encounter.  

## 2019-05-24 NOTE — Telephone Encounter (Signed)
Patient returned call and she was told that her COVID-19 test was positive and she can pass the germ to others. She has viewed her resultin my chart.  She states she has sore throat stuffy nose and loss of taste and smell.  Symptom tier and criteria for ending isolation were read to patient. Good preventative practices were discussed. Patient was told to isolate herself from her children and husband if possible. Creating an sick room. She was told that her husband and children should quarantine from others for 14 days while monitoring for symptoms.  They can be tested but I recommended 5-7 days after exposure and continued monitoring for symptoms. Ms Arvin Collard verbalized understanding of all information.  She is aware that testing site require scheduling appointments and states she has the information for scheduling. Per previous notation HD has been notified.

## 2019-06-11 ENCOUNTER — Ambulatory Visit: Payer: Medicaid Other | Attending: Internal Medicine

## 2019-06-11 ENCOUNTER — Other Ambulatory Visit: Payer: Self-pay

## 2019-06-11 DIAGNOSIS — Z20822 Contact with and (suspected) exposure to covid-19: Secondary | ICD-10-CM

## 2019-06-12 LAB — NOVEL CORONAVIRUS, NAA: SARS-CoV-2, NAA: NOT DETECTED

## 2019-06-25 ENCOUNTER — Other Ambulatory Visit: Payer: Self-pay

## 2019-06-25 ENCOUNTER — Ambulatory Visit
Admission: EM | Admit: 2019-06-25 | Discharge: 2019-06-25 | Disposition: A | Discharge status: Home / Self Care | Payer: Medicaid Other | Attending: Emergency Medicine | Admitting: Emergency Medicine

## 2019-06-25 DIAGNOSIS — K219 Gastro-esophageal reflux disease without esophagitis: Secondary | ICD-10-CM

## 2019-06-25 DIAGNOSIS — Z8616 Personal history of COVID-19: Secondary | ICD-10-CM

## 2019-06-25 DIAGNOSIS — R0789 Other chest pain: Secondary | ICD-10-CM

## 2019-06-25 MED ORDER — OMEPRAZOLE 20 MG PO CPDR: 20.0000 mg | DELAYED_RELEASE_CAPSULE | Freq: Every day | ORAL | 0 refills | Status: DC

## 2019-06-25 NOTE — ED Provider Notes (Signed)
[facility]ARE [facility]ENTER   [id number] 06/25/19 Arrival Time: 1215   CC: CHEST PAIN  SUBJECTIVE:  Linda Pollard is a 29 y.o. female who presents with complaint of chest pain/ pressure that began last night.  Denies a precipitating event, trauma, or strenuous upper body activities.  Dxed with COVID 1 month ago, denies cough or SOB at that time, or currently.   Localizes chest pain to the distal sternal region.  Describes as stable, that is intermittent (with episodes lasting 2 minutes at a time) and sharp in character.  Rates pain as 7/10.   Has not tried OTC medication without relief.  Denies aggravating factors.  Denies radiating symptoms.  Denies previous symptoms in the past.  Complains of anxiety.  Denies fever, chills, lightheadedness, dizziness, palpitations, tachycardia, SOB, nausea, vomiting, abdominal pain, changes in bowel or bladder habits, diaphoresis, numbness/tingling in extremities, peripheral edema, or anxiety.  Feels like she has to go to the restroom when she has the chest discomfort.    Denies SOB, calf pain or swelling, recent long travel, recent surgery, pregnancy, malignancy, tobacco use, hormone use, or previous blood clot  Denies close relatives with cardiac hx  Previous cardiac testing: none.  ROS: As per HPI.  All other pertinent ROS negative.    Past Medical History:  Diagnosis Date  . Gallstone   . Nexplanon insertion 01/01/2014   nexplanon inserted 01/01/14 left arm remove 01/01/17  . Pregnancy with one fetus    Past Surgical History:  Procedure Laterality Date  . CHOLECYSTECTOMY  12/03/2011   Procedure: LAPAROSCOPIC CHOLECYSTECTOMY;  Surgeon: Dalia Heading, MD;  Location: [facility];  Service: General;  Laterality: N/A;  . NO PAST SURGERIES     No Known Allergies No current facility-administered medications on file prior to encounter.   Current Outpatient Medications on File Prior to Encounter  Medication Sig Dispense Refill  . etonogestrel (NEXPLANON) 68  MG IMPL implant Inject 1 each into the skin once.    . [DISCONTINUED] fluticasone (FLONASE) 50 MCG/ACT nasal spray Place 2 sprays into both nostrils daily. 16 g 0  . [DISCONTINUED] sucralfate (CARAFATE) 1 GM/10ML suspension Take 10 mLs (1 g total) by mouth 4 (four) times daily -  with meals and at bedtime. 420 mL 0   Social History   Socioeconomic History  . Marital status: Married    Spouse name: Not on file  . Number of children: Not on file  . Years of education: Not on file  . Highest education level: Not on file  Occupational History  . Not on file  Tobacco Use  . Smoking status: Never Smoker  . Smokeless tobacco: Never Used  Substance and Sexual Activity  . Alcohol use: No  . Drug use: No  . Sexual activity: Not Currently    Birth control/protection: None  Other Topics Concern  . Not on file  Social History Narrative  . Not on file   Social Determinants of Health   Financial Resource Strain:   . Difficulty of Paying Living Expenses: Not on file  Food Insecurity:   . Worried About Programme researcher, broadcasting/film/video in the Last Year: Not on file  . Ran Out of Food in the Last Year: Not on file  Transportation Needs:   . Lack of Transportation (Medical): Not on file  . Lack of Transportation (Non-Medical): Not on file  Physical Activity:   . Days of Exercise per Week: Not on file  . Minutes of Exercise per Session: Not on  file  Stress:   . Feeling of Stress : Not on file  Social Connections:   . Frequency of Communication with Friends and Family: Not on file  . Frequency of Social Gatherings with Friends and Family: Not on file  . Attends Religious Services: Not on file  . Active Member of Clubs or Organizations: Not on file  . Attends Archivist Meetings: Not on file  . Marital Status: Not on file  Intimate Partner Violence:   . Fear of Current or Ex-Partner: Not on file  . Emotionally Abused: Not on file  . Physically Abused: Not on file  . Sexually Abused: Not on  file   Family History  Problem Relation Age of Onset  . Healthy Father   . Healthy Mother   . Anesthesia problems Neg Hx      OBJECTIVE:  Vitals:   06/25/19 1231  BP: 110/73  Pulse: 83  Resp: 16  Temp: 99.7 F (37.6 C)  TempSrc: Oral  SpO2: 96%    General appearance: alert; no distress Eyes: PERRLA; EOMI; conjunctiva normal HENT: normocephalic; atraumatic Neck: supple; carotid bruits Lungs: clear to auscultation bilaterally without adventitious breath sounds Heart: regular rate and rhythm.  Radial pulse 2+ bilaterally Chest Wall: Mildly TTP over distal sternum Abdomen: soft, non-tender; bowel sounds normal; no guarding  Back: no CVA tenderness Extremities: no cyanosis or edema; symmetrical with no gross deformities Skin: warm and dry Psychological: alert and cooperative; normal mood and affect  ECG: Orders placed or performed during the hospital encounter of 06/25/19  . ED EKG  . ED EKG    EKG normal sinus rhythm without ST elevations, depressions, or prolonged PR interval.  No narrowing or widening of the QRS complexes.    ASSESSMENT & PLAN:  1. Chest pressure   2. History of COVID-19   3. Gastroesophageal reflux disease, unspecified whether esophagitis present     Meds ordered this encounter  Medications  . omeprazole (PRILOSEC) 20 MG capsule    Sig: Take 1 capsule (20 mg total) by mouth daily.    Dispense:  30 capsule    Refill:  0    Order Specific Question:   Supervising Provider    Answer:   Raylene Everts [[id number]]    Unable to rule out cardiac disease or blood clot in urgent care setting.  Offered patient further evaluation and management in the ED.  Patient declines at this time and would like to try outpatient therapy first.  Aware of the risk associated with this decision including missed diagnosis, organ damage, organ failure, and/or death.  Patient aware and in agreement.     EKG reassuring We will try treating for acid  reflux Omeprazole prescribed take daily Avoid eating 2-3 hours before bed Elevate bed.  Avoid chocolate, caffeine, alcohol, onion, and mint prior to bed.  This relaxes the bottom part of your esophagus and can make your symptoms worse.  Follow up with PCP for recheck Return or go to the ED if you have any new or worsening symptoms fever, chills, nausea, vomiting, worsening chest discomfort, abdominal pain, symptoms do not improve with medication, etc...  Chest pain precautions given. Reviewed expectations re: course of current medical issues. Questions answered. Outlined signs and symptoms indicating need for more acute intervention. Patient verbalized understanding. After Visit Summary given.   Lestine Box, PA-C 06/25/19 1650

## 2019-06-25 NOTE — ED Triage Notes (Signed)
Pt presents to UC w/ c/o chest pressure since last night. Denies cough or sob. Pt was covid positive 1 month ago.

## 2019-06-25 NOTE — Discharge Instructions (Signed)
Unable to rule out cardiac disease or blood clot in urgent care setting.  Offered patient further evaluation and management in the ED.  Patient declines at this time and would like to try outpatient therapy first.  Aware of the risk associated with this decision including missed diagnosis, organ damage, organ failure, and/or death.  Patient aware and in agreement.     EKG reassuring We will try treating for acid reflux Omeprazole prescribed take daily Avoid eating 2-3 hours before bed Elevate bed.  Avoid chocolate, caffeine, alcohol, onion, and mint prior to bed.  This relaxes the bottom part of your esophagus and can make your symptoms worse.  Follow up with PCP for recheck Return or go to the ED if you have any new or worsening symptoms fever, chills, nausea, vomiting, worsening chest discomfort, abdominal pain, symptoms do not improve with medication, etc..Marland Kitchen

## 2019-07-03 ENCOUNTER — Emergency Department (HOSPITAL_COMMUNITY)
Admission: EM | Admit: 2019-07-03 | Discharge: 2019-07-03 | Disposition: A | Discharge status: Home / Self Care | Payer: Self-pay | Attending: Emergency Medicine | Admitting: Emergency Medicine

## 2019-07-03 ENCOUNTER — Emergency Department (HOSPITAL_COMMUNITY): Payer: Self-pay

## 2019-07-03 ENCOUNTER — Encounter (HOSPITAL_COMMUNITY): Payer: Self-pay

## 2019-07-03 ENCOUNTER — Other Ambulatory Visit: Payer: Self-pay

## 2019-07-03 DIAGNOSIS — R0789 Other chest pain: Secondary | ICD-10-CM | POA: Insufficient documentation

## 2019-07-03 DIAGNOSIS — Z793 Long term (current) use of hormonal contraceptives: Secondary | ICD-10-CM | POA: Insufficient documentation

## 2019-07-03 LAB — COMPREHENSIVE METABOLIC PANEL
ALT: 20 U/L (ref 0–44)
AST: 17 U/L (ref 15–41)
Albumin: 4.1 g/dL (ref 3.5–5.0)
Alkaline Phosphatase: 66 U/L (ref 38–126)
Anion gap: 9 (ref 5–15)
BUN: 14 mg/dL (ref 6–20)
CO2: 29 mmol/L (ref 22–32)
Calcium: 9.2 mg/dL (ref 8.9–10.3)
Chloride: 105 mmol/L (ref 98–111)
Creatinine, Ser: 0.57 mg/dL (ref 0.44–1.00)
GFR calc Af Amer: >60 mL/min (ref >60)
GFR calc non Af Amer: >60 mL/min (ref >60)
Glucose, Bld: 89 mg/dL (ref 70–99)
Potassium: 3.6 mmol/L (ref 3.5–5.1)
Sodium: 143 mmol/L (ref 135–145)
Total Bilirubin: 0.4 mg/dL (ref 0.3–1.2)
Total Protein: 7.2 g/dL (ref 6.5–8.1)

## 2019-07-03 LAB — CBC WITH DIFFERENTIAL/PLATELET
Abs Immature Granulocytes: 0.03 10*3/uL (ref 0.00–0.07)
Basophils Absolute: 0.1 10*3/uL (ref 0.0–0.1)
Basophils Relative: 0 %
Eosinophils Absolute: 0.1 10*3/uL (ref 0.0–0.5)
Eosinophils Relative: 1 %
HCT: 40 % (ref 36.0–46.0)
Hemoglobin: 12.9 g/dL (ref 12.0–15.0)
Immature Granulocytes: 0 %
Lymphocytes Relative: 31 %
Lymphs Abs: 3.6 10*3/uL (ref 0.7–4.0)
MCH: 30.4 pg (ref 26.0–34.0)
MCHC: 32.3 g/dL (ref 30.0–36.0)
MCV: 94.1 fL (ref 80.0–100.0)
Monocytes Absolute: 1.1 10*3/uL — ABNORMAL HIGH (ref 0.1–1.0)
Monocytes Relative: 9 %
Neutro Abs: 6.8 10*3/uL (ref 1.7–7.7)
Neutrophils Relative %: 59 %
Platelets: 260 10*3/uL (ref 150–400)
RBC: 4.25 MIL/uL (ref 3.87–5.11)
RDW: 13.1 % (ref 11.5–15.5)
WBC: 11.6 10*3/uL — ABNORMAL HIGH (ref 4.0–10.5)
nRBC: 0 % (ref 0.0–0.2)

## 2019-07-03 NOTE — ED Provider Notes (Signed)
[facility] EMERGENCY DEPARTMENT Provider Note   CSN: [id number] Arrival date & time: 07/03/19  1829     History No chief complaint on file.   Linda Pollard is a 29 y.o. female.  Patient presents to the ED with complaint of chest discomfort onset 10 days ago. Discomfort is described as pressure in the substernal area. Does not radiate. Is not associated with eating, position, or respiratory effort. No shortness of breath or exertional dyspnea. She does not smoke. She has a nexplanon implant for birth control. She has been seen at urgent care and was started on omeprazole. The discomfort improved after the omeprazole. Patient is minimally tachycardic. Denies leg swelling or calf pain. She has had her gall bladder removed.  The history is provided by the patient. No language interpreter was used.  Chest Pain Pain location:  Substernal area Pain quality: pressure   Pain radiates to:  Does not radiate Pain severity:  Moderate Onset quality:  Sudden Duration:  10 days Timing:  Intermittent Progression:  Waxing and waning Chronicity:  New Relieved by:  Antacids Associated symptoms: no abdominal pain, no cough, no dysphagia, no fever, no lower extremity edema, no nausea, no shortness of breath and no vomiting   Risk factors: no smoking        Past Medical History:  Diagnosis Date  . Gallstone   . Nexplanon insertion 01/01/2014   nexplanon inserted 01/01/14 left arm remove 01/01/17  . Pregnancy with one fetus     Patient Active Problem List   Diagnosis Date Noted  . Nexplanon insertion 01/01/2014  . Supervision of other normal pregnancy 04/30/2013    Past Surgical History:  Procedure Laterality Date  . CHOLECYSTECTOMY  12/03/2011   Procedure: LAPAROSCOPIC CHOLECYSTECTOMY;  Surgeon: Dalia Heading, MD;  Location: [facility];  Service: General;  Laterality: N/A;  . NO PAST SURGERIES       OB History    Gravida  2   Para  2   Term  2   Preterm      AB      Living  2      SAB      TAB      Ectopic      Multiple      Live Births  2           Family History  Problem Relation Age of Onset  . Healthy Father   . Healthy Mother   . Anesthesia problems Neg Hx     Social History   Tobacco Use  . Smoking status: Never Smoker  . Smokeless tobacco: Never Used  Substance Use Topics  . Alcohol use: No  . Drug use: No    Home Medications Prior to Admission medications   Medication Sig Start Date End Date Taking? Authorizing Provider  etonogestrel (NEXPLANON) 68 MG IMPL implant Inject 1 each into the skin once.    [provider]  omeprazole (PRILOSEC) 20 MG capsule Take 1 capsule (20 mg total) by mouth daily. 06/25/19   Wurst, [place], PA-C  fluticasone (FLONASE) 50 MCG/ACT nasal spray Place 2 sprays into both nostrils daily. 08/09/17 06/25/19  Michela Pitcher A, PA-C  sucralfate (CARAFATE) 1 GM/10ML suspension Take 10 mLs (1 g total) by mouth 4 (four) times daily -  with meals and at bedtime. 05/27/17 06/25/19  Gerhard Munch, MD    Allergies    Patient has no known allergies.  Review of Systems   Review of Systems  Constitutional:  Negative for fever.  HENT: Negative for trouble swallowing.   Respiratory: Negative for cough and shortness of breath.   Cardiovascular: Positive for chest pain. Negative for leg swelling.  Gastrointestinal: Negative for abdominal pain, nausea and vomiting.  All other systems reviewed and are negative.   Physical Exam Updated Vital Signs BP 128/70 (BP Location: Right Arm)   Pulse 100   Temp 98.8 F (37.1 C) (Oral)   Resp 12   Ht 4\' 11"  (1.499 m)   Wt 77.1 kg   SpO2 100%   BMI 34.34 kg/m   Physical Exam Constitutional:      General: She is not in acute distress.    Appearance: Normal appearance. She is not ill-appearing.  HENT:     Head: Normocephalic.  Eyes:     Conjunctiva/sclera: Conjunctivae normal.  Cardiovascular:     Rate and Rhythm: Regular rhythm.     Heart sounds: No murmur.    Pulmonary:     Effort: Pulmonary effort is normal.     Breath sounds: Normal breath sounds.  Abdominal:     Palpations: Abdomen is soft.  Musculoskeletal:        General: No swelling.  Skin:    General: Skin is warm and dry.  Neurological:     Mental Status: She is alert and oriented to person, place, and time.  Psychiatric:        Mood and Affect: Mood normal.     ED Results / Procedures / Treatments   Labs (all labs ordered are listed, but only abnormal results are displayed) Labs Reviewed  CBC WITH DIFFERENTIAL/PLATELET - Abnormal; Notable for the following components:      Result Value   WBC 11.6 (*)    Monocytes Absolute 1.1 (*)    All other components within normal limits  COMPREHENSIVE METABOLIC PANEL    EKG EKG Interpretation  Date/Time:  Tuesday July 03 2019 18:47:01 EST Ventricular Rate:  102 PR Interval:  124 QRS Duration: 82 QT Interval:  340 QTC Calculation: 443 R Axis:   84 Text Interpretation: Sinus tachycardia Otherwise normal ECG No previous ECGs available Confirmed by Fredia Sorrow [phone]) on 07/03/2019 6:52:56 PM   Radiology No results found.  Procedures Procedures (including critical care time)  Medications Ordered in ED Medications - No data to display  ED Course  I have reviewed the triage vital signs and the nursing notes.  Pertinent labs & imaging results that were available during my care of the patient were reviewed by me and considered in my medical decision making (see chart for details).    MDM Rules/Calculators/A&P                      Patient is to be discharged with recommendation to follow up with PCP in regards to today's hospital visit. Chest pain is not likely of cardiac or pulmonary etiology d/t presentation, perc negative, VSS, no tracheal deviation, no JVD or new murmur, RRR, breath sounds equal bilaterally, EKG without acute abnormalities, and negative CXR. She is not tachypneic or short of breath. No increased  oxygen demand. Pt has been advised start a PPI and return to the ED is CP becomes exertional, associated with diaphoresis or nausea, radiates to left jaw/arm, worsens or becomes concerning in any way. Pt appears reliable for follow up and is agreeable to discharge.     Final Clinical Impression(s) / ED Diagnoses Final diagnoses:  Discomfort in chest    Rx / DC  Orders ED Discharge Orders    None       Felicie Morn, NP 07/03/19 7215    Vanetta Mulders, MD 07/10/19 [phone]

## 2019-07-03 NOTE — ED Triage Notes (Signed)
Pt has been having ongoing chest pain for the last 10 days. States she is feeling soreness now. Pt had COVID in December. Denies any other complaints.

## 2019-07-03 NOTE — Discharge Instructions (Addendum)
Continue with the omeprazole. Refer to the attached instructions.

## 2019-08-18 ENCOUNTER — Ambulatory Visit: Payer: Medicaid Other | Attending: Internal Medicine

## 2019-08-18 DIAGNOSIS — Z23 Encounter for immunization: Secondary | ICD-10-CM

## 2019-08-18 NOTE — Progress Notes (Signed)
   Covid-19 Vaccination Clinic  Name:  Lekeshia Kram    MRN: [id number] DOB: [birth year]  08/18/2019  Ms. Procter was observed post Covid-19 immunization for 15 minutes without incident. She was provided with Vaccine Information Sheet and instruction to access the V-Safe system.   Ms. Muchmore was instructed to call 911 with any severe reactions post vaccine: Marland Kitchen Difficulty breathing  . Swelling of face and throat  . A fast heartbeat  . A bad rash all over body  . Dizziness and weakness   Immunizations Administered    Name Date Dose VIS Date Route   Moderna COVID-19 Vaccine 08/18/2019 12:11 PM 0.5 mL 05/08/2019 Intramuscular   Manufacturer: Moderna   Lot: [id number]   NDC: 82518-984-21

## 2019-09-19 ENCOUNTER — Ambulatory Visit: Payer: Medicaid Other | Attending: Internal Medicine

## 2019-09-19 DIAGNOSIS — Z23 Encounter for immunization: Secondary | ICD-10-CM

## 2019-09-19 NOTE — Progress Notes (Signed)
   Covid-19 Vaccination Clinic  Name:  Linda Pollard    MRN: [id number] DOB: 05/23/1991  09/19/2019  Ms. Ost was observed post Covid-19 immunization for 15 minutes without incident. She was provided with Vaccine Information Sheet and instruction to access the V-Safe system.   [name] was instructed to call 911 with any severe reactions post vaccine: Marland Kitchen Difficulty breathing  . Swelling of face and throat  . A fast heartbeat  . A bad rash all over body  . Dizziness and weakness   Immunizations Administered    Name Date Dose VIS Date Route   Moderna COVID-19 Vaccine 09/19/2019 11:46 AM 0.5 mL 05/08/2019 Intramuscular   Manufacturer: Moderna   Lot: [id number]   NDC: 60677-034-03

## 2019-12-24 ENCOUNTER — Other Ambulatory Visit (HOSPITAL_COMMUNITY): Payer: Self-pay | Admitting: *Deleted

## 2019-12-24 DIAGNOSIS — R0781 Pleurodynia: Secondary | ICD-10-CM

## 2019-12-26 ENCOUNTER — Other Ambulatory Visit: Payer: Self-pay

## 2019-12-26 ENCOUNTER — Ambulatory Visit (HOSPITAL_COMMUNITY)
Admission: RE | Admit: 2019-12-26 | Discharge: 2019-12-26 | Disposition: A | Discharge status: Home / Self Care | Payer: Self-pay | Source: Ambulatory Visit | Attending: *Deleted | Admitting: *Deleted

## 2019-12-26 DIAGNOSIS — R0781 Pleurodynia: Secondary | ICD-10-CM

## 2020-03-15 IMAGING — DX DG CHEST 2V
2 series · 2 of 2 positions shown · non-contrast
Comparison: August 09, 2017

CLINICAL DATA: Chest pain.

EXAM:
CHEST - 2 VIEW

[chest pa]
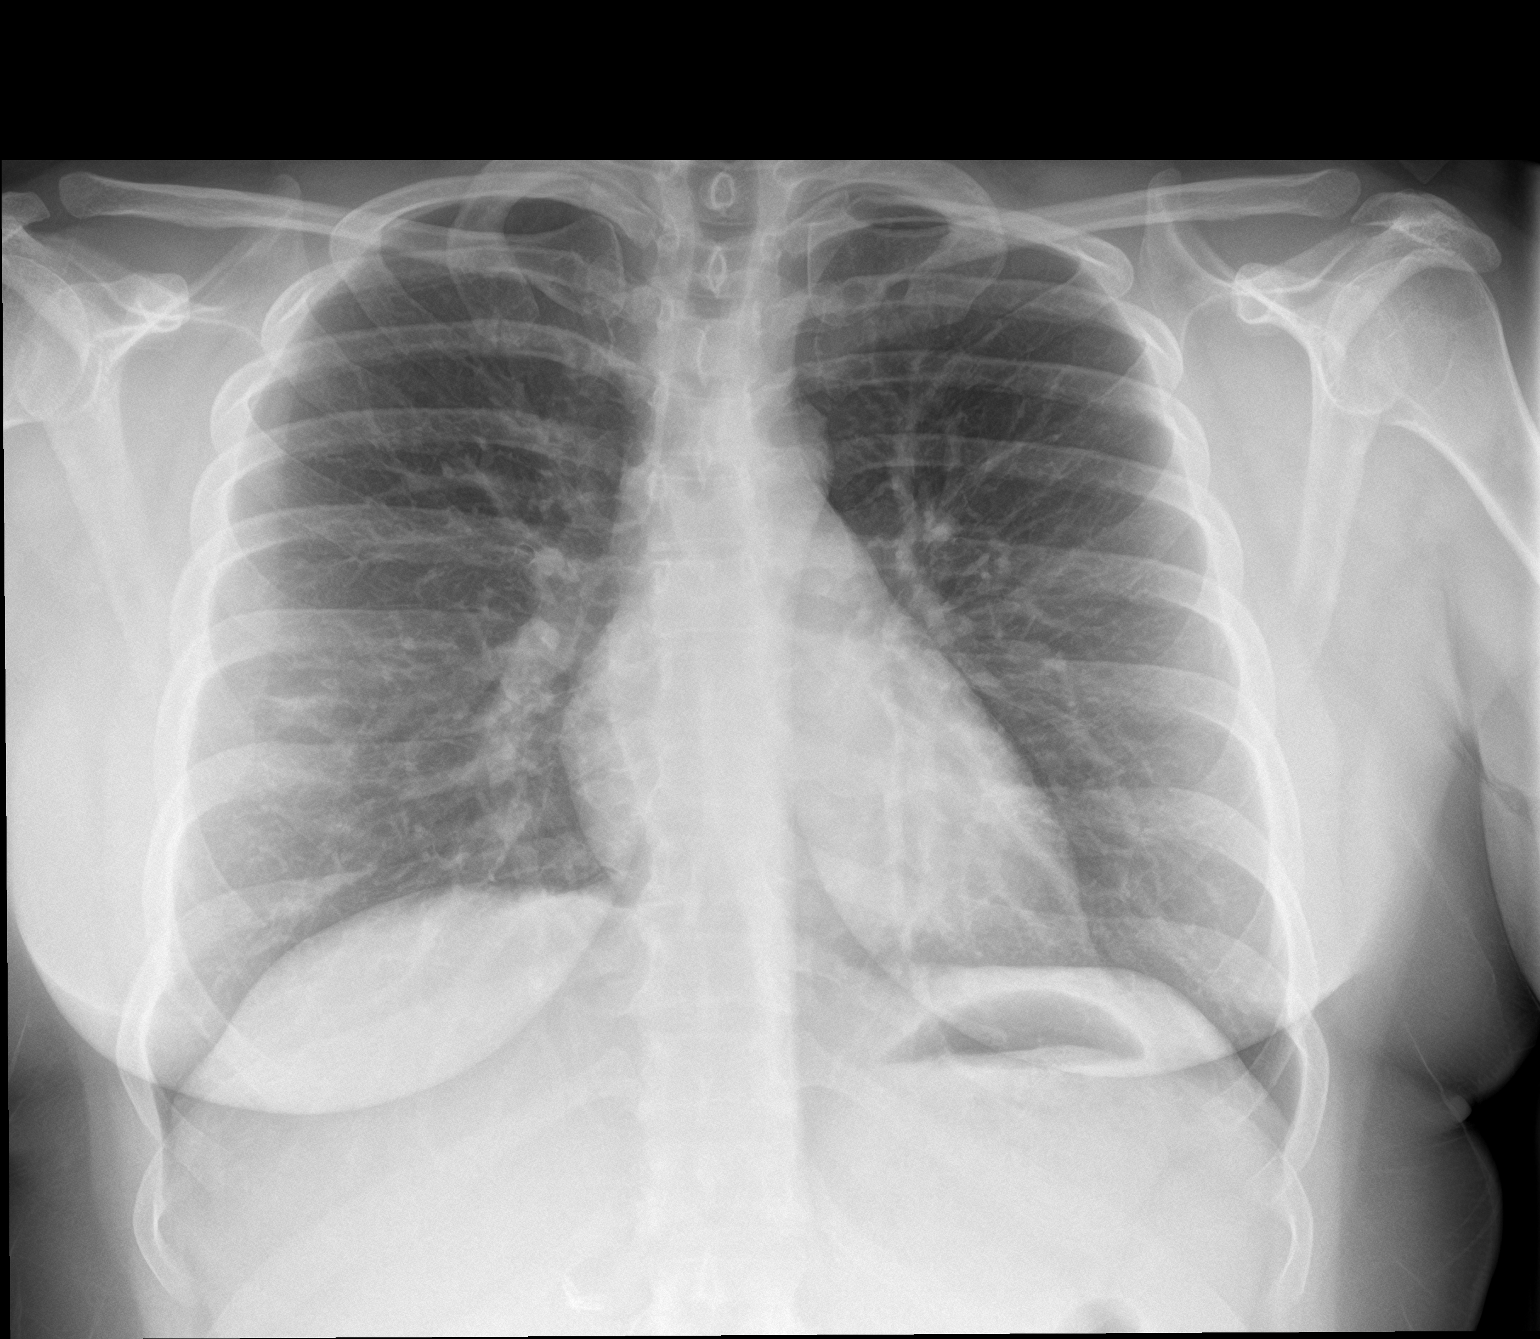

[chest lat]
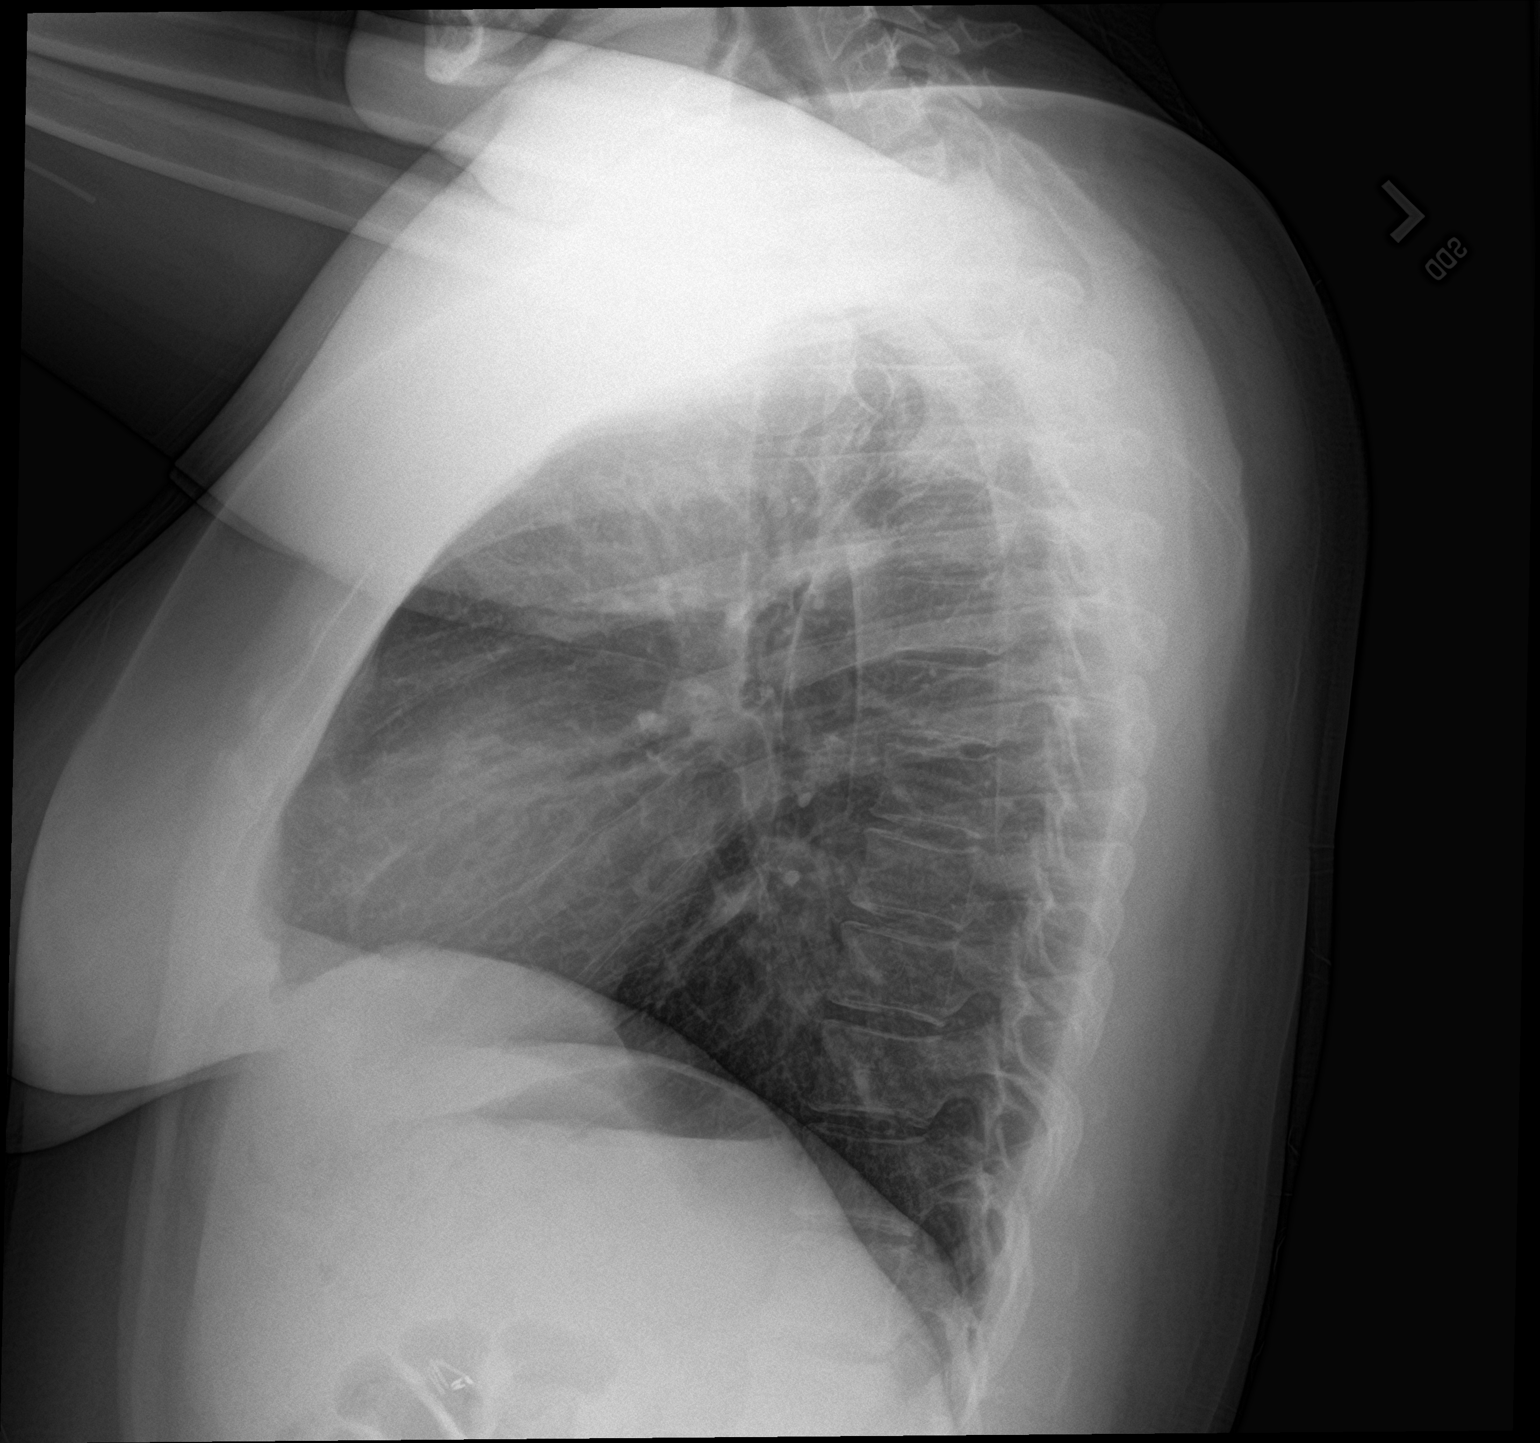

[2 of 2 positions shown; findings below may reference images not displayed]

FINDINGS: A trace amount of linear atelectasis is seen within the right lung
base. There is no evidence of a pleural effusion or pneumothorax.
The heart size and mediastinal contours are within normal limits.
The visualized skeletal structures are unremarkable. Radiopaque
surgical clips are seen overlying the right upper quadrant.
IMPRESSION: 1. Trace amount of right basilar linear atelectasis.

## 2020-07-24 ENCOUNTER — Other Ambulatory Visit: Payer: Self-pay

## 2020-07-24 ENCOUNTER — Emergency Department (HOSPITAL_COMMUNITY)
Admission: EM | Admit: 2020-07-24 | Discharge: 2020-07-24 | Disposition: A | Discharge status: Home / Self Care | Payer: Self-pay | Attending: Emergency Medicine | Admitting: Emergency Medicine

## 2020-07-24 ENCOUNTER — Emergency Department (HOSPITAL_COMMUNITY): Payer: Self-pay

## 2020-07-24 ENCOUNTER — Encounter (HOSPITAL_COMMUNITY): Payer: Self-pay | Admitting: *Deleted

## 2020-07-24 DIAGNOSIS — L03211 Cellulitis of face: Secondary | ICD-10-CM | POA: Insufficient documentation

## 2020-07-24 LAB — CBC WITH DIFFERENTIAL/PLATELET
Abs Immature Granulocytes: 0.04 10*3/uL (ref 0.00–0.07)
Basophils Absolute: 0 10*3/uL (ref 0.0–0.1)
Basophils Relative: 1 %
Eosinophils Absolute: 0.1 10*3/uL (ref 0.0–0.5)
Eosinophils Relative: 1 %
HCT: 40.4 % (ref 36.0–46.0)
Hemoglobin: 13.2 g/dL (ref 12.0–15.0)
Immature Granulocytes: 1 %
Lymphocytes Relative: 26 %
Lymphs Abs: 1.8 10*3/uL (ref 0.7–4.0)
MCH: 30.1 pg (ref 26.0–34.0)
MCHC: 32.7 g/dL (ref 30.0–36.0)
MCV: 92.2 fL (ref 80.0–100.0)
Monocytes Absolute: 0.6 10*3/uL (ref 0.1–1.0)
Monocytes Relative: 9 %
Neutro Abs: 4.5 10*3/uL (ref 1.7–7.7)
Neutrophils Relative %: 62 %
Platelets: 228 10*3/uL (ref 150–400)
RBC: 4.38 MIL/uL (ref 3.87–5.11)
RDW: 13 % (ref 11.5–15.5)
WBC: 7 10*3/uL (ref 4.0–10.5)
nRBC: 0 % (ref 0.0–0.2)

## 2020-07-24 LAB — BASIC METABOLIC PANEL
Anion gap: 6 (ref 5–15)
BUN: 11 mg/dL (ref 6–20)
CO2: 26 mmol/L (ref 22–32)
Calcium: 8.9 mg/dL (ref 8.9–10.3)
Chloride: 105 mmol/L (ref 98–111)
Creatinine, Ser: 0.6 mg/dL (ref 0.44–1.00)
GFR, Estimated: >60 mL/min (ref >60)
Glucose, Bld: 115 mg/dL — ABNORMAL HIGH (ref 70–99)
Potassium: 3.6 mmol/L (ref 3.5–5.1)
Sodium: 137 mmol/L (ref 135–145)

## 2020-07-24 LAB — I-STAT BETA HCG BLOOD, ED (MC, WL, AP ONLY): I-stat hCG, quantitative: <5 m[IU]/mL (ref <5)

## 2020-07-24 MED ORDER — CEPHALEXIN 500 MG PO CAPS: 500.0000 mg | ORAL_CAPSULE | Freq: Four times a day (QID) | ORAL | 0 refills | Status: AC

## 2020-07-24 MED ORDER — FENTANYL CITRATE (PF) 100 MCG/2ML IJ SOLN
50.0000 ug | Freq: Once | INTRAMUSCULAR | Status: AC
  Administered 2020-07-24: 50 ug via INTRAVENOUS
  Filled 2020-07-24: qty 2

## 2020-07-24 MED ORDER — KETOROLAC TROMETHAMINE 30 MG/ML IJ SOLN
30.0000 mg | Freq: Once | INTRAMUSCULAR | Status: AC
  Administered 2020-07-24: 30 mg via INTRAVENOUS
  Filled 2020-07-24: qty 1

## 2020-07-24 MED ORDER — HYDROCORTISONE 1 % EX CREA: TOPICAL_CREAM | CUTANEOUS | 0 refills | Status: DC

## 2020-07-24 MED ORDER — IOHEXOL 300 MG/ML  SOLN
75.0000 mL | Freq: Once | INTRAMUSCULAR | Status: AC | PRN
  Administered 2020-07-24: 75 mL via INTRAVENOUS

## 2020-07-24 MED ORDER — CEPHALEXIN 500 MG PO CAPS
500.0000 mg | ORAL_CAPSULE | Freq: Once | ORAL | Status: AC
  Administered 2020-07-24: 500 mg via ORAL
  Filled 2020-07-24: qty 1

## 2020-07-24 MED ORDER — OXYCODONE-ACETAMINOPHEN 5-325 MG PO TABS
1.0000 | ORAL_TABLET | Freq: Once | ORAL | Status: AC
  Administered 2020-07-24: 1 via ORAL
  Filled 2020-07-24: qty 1

## 2020-07-24 NOTE — ED Triage Notes (Addendum)
Pt c/o left sided facial pain since Tuesday afternoon that that has now moved up into her head causing a migraine on the left side. Pt also c/o swelling to left jaw/ear. Bumpy rash to face and head that started yesterday. Pt went to Urgent Care and was sent to ED for further evaluation due to mastoiditis.

## 2020-07-24 NOTE — Discharge Instructions (Addendum)
You were seen in the emergency department today for your facial swelling, pain, and the rash on your face.  Your physical exam, vital signs, and blood work were very reassuring.  The CT scan of your head was also very reassuring.  There is no infection in the bone behind your ear called your mastoid bone.  There is no infection inside the orbit of your eye or your eye itself.  This is very good news.  I do feel that your presentation is most consistent with an infection of the soft tissues of your face called cellulitis.  For that reason you been administered your first dose of antibiotics in the emergency department have been prescribed antibiotics for the next 10 days at home.  Your rash may also have a component of allergic reaction, for this reason he had been prescribed a steroid cream which you may apply to the rash.  If it improves your rash may continue to use it once to twice daily, however if it does not improve you may discontinue usage.  Please follow-up with your primary care doctor within the next week for reevaluation of your symptoms.  Please return to the emergency department if you develop any worsening pain or swelling, difficulty swallowing, change in the sound of your voice, worsened pain with movement of your eyes, blurry vision, double vision, or any other new severe symptoms.

## 2020-07-24 NOTE — ED Notes (Signed)
This nurse attempted IV x's2

## 2020-07-24 NOTE — ED Provider Notes (Signed)
  Face-to-face evaluation   History: She presents for evaluation of rash on her bilateral face which has been present for 2 days, and gradually getting worse.  She also feels it on the left scalp.  She has a left-sided headache.  She denies fever, chills, nausea or vomiting.  She has not had a similar rash in the past.  She denies changing hair or skin products recently.  No known new exposures to be considered for inciting allergies.  No similar problems in the past.  There are no other known modifying factors.  Physical exam: Obese, alert and cooperative.  No dysarthria or aphasia.  Skin with skin colored palpable rash of bilateral face, left greater than right.  No associated vesicles, drainage or bleeding.  Very few small similar areas in the left frontal scalp region.  No posterior scalp or neck rash.  Medical screening examination/treatment/procedure(s) were conducted as a shared visit with non-physician practitioner(s) and myself.  I personally evaluated the patient during the encounter    Mancel Bale, MD 07/25/20 1100

## 2020-07-24 NOTE — ED Provider Notes (Signed)
[facility] EMERGENCY DEPARTMENT Provider Note   CSN: [id number] Arrival date & time: 07/24/20  [id number]     History Chief Complaint  Patient presents with  . Facial Pain    Linda Pollard is a 30 y.o. female who presented prompting urgent care providers for evaluation for possible mastoiditis.  Patient endorses 3 days of left-sided facial pain that gradually came on and caused some swelling behind her left ear and down into her left jaw.  She endorses deep pressure type pain in her internal left ear, without change in her hearing.  Additionally she endorses erythematous, itchy red rash to the left half of her face extending into her left scalp.  There are some few small spots on her right cheek as well.  She endorses pain in her left eye, and onset of migraine this morning which she suspects is secondary to her ear and jaw pain.  She does endorse history of migraines in the past.  She denies any recent injury, rash, or breaks in her skin on her face.  Denies any recent diagnosis of your infection.  She endorses history of chickenpox as a child, but states she also believes she received chickenpox vaccination.  She has been taking ibuprofen and Tylenol at home without significant relief.  Additionally she had been on Zyrtec after evaluation by urgent care provider who felt that her rash was more consistent with allergic dermatitis.  I have personally reviewed this patient's medical records.  Patient has had her gallbladder removed, but denies any other surgeries.  She does not carry medical diagnoses and is not on any medications every day.  She does have a Nexplanon implant in her arm.  HPI     Past Medical History:  Diagnosis Date  . Gallstone   . Nexplanon insertion 01/01/2014   nexplanon inserted 01/01/14 left arm remove 01/01/17  . Pregnancy with one fetus     Patient Active Problem List   Diagnosis Date Noted  . Nexplanon insertion 01/01/2014  . Supervision of other normal pregnancy  04/30/2013    Past Surgical History:  Procedure Laterality Date  . CHOLECYSTECTOMY  12/03/2011   Procedure: LAPAROSCOPIC CHOLECYSTECTOMY;  Surgeon: Dalia HeadingMark A Jenkins, MD;  Location: [facility];  Service: General;  Laterality: N/A;  . NO PAST SURGERIES       OB History    Gravida  2   Para  2   Term  2   Preterm      AB      Living  2     SAB      IAB      Ectopic      Multiple      Live Births  2           Family History  Problem Relation Age of Onset  . Healthy Father   . Healthy Mother   . Anesthesia problems Neg Hx     Social History   Tobacco Use  . Smoking status: Never Smoker  . Smokeless tobacco: Never Used  Vaping Use  . Vaping Use: Never used  Substance Use Topics  . Alcohol use: No  . Drug use: No    Home Medications Prior to Admission medications   Medication Sig Start Date End Date Taking? Authorizing Provider  cephALEXin (KEFLEX) 500 MG capsule Take 1 capsule (500 mg total) by mouth 4 (four) times daily for 10 days. 07/24/20 08/03/20 Yes Keen Ewalt, Eugene Gaviaebekah R, PA-C  etonogestrel (NEXPLANON) 68 MG IMPL implant  Inject 1 each into the skin once.   Yes [provider]  hydrocortisone cream 1 % Apply to affected area 2 times daily 07/24/20  Yes Arneda Sappington R, PA-C  omeprazole (PRILOSEC) 20 MG capsule Take 1 capsule (20 mg total) by mouth daily. Patient not taking: Reported on 07/24/2020 06/25/19   Wurst, [place], PA-C  fluticasone [facility]) 50 MCG/ACT nasal spray Place 2 sprays into both nostrils daily. 08/09/17 06/25/19  Michela Pitcher A, PA-C  sucralfate (CARAFATE) 1 GM/10ML suspension Take 10 mLs (1 g total) by mouth 4 (four) times daily -  with meals and at bedtime. 05/27/17 06/25/19  Gerhard Munch, MD    Allergies    Patient has no known allergies.  Review of Systems   Review of Systems  Constitutional: Negative.  Negative for fatigue and fever.  HENT: Positive for ear pain and facial swelling. Negative for sinus pain, sore  throat, trouble swallowing and voice change.   Eyes: Negative.  Negative for photophobia and visual disturbance.  Respiratory: Negative.   Cardiovascular: Negative.   Gastrointestinal: Negative.   Genitourinary: Negative.   Musculoskeletal: Negative.   Skin: Positive for rash.  Neurological: Positive for headaches. Negative for dizziness, syncope, speech difficulty, weakness and light-headedness.  Hematological: Negative.   Psychiatric/Behavioral: Negative.     Physical Exam Updated Vital Signs BP 115/72 (BP Location: Left Arm)   Pulse 89   Temp 98.9 F (37.2 C) (Oral)   Resp 15   Ht 4\' 11"  (1.499 m)   Wt 77.1 kg   LMP  (Within Weeks)   SpO2 97%   BMI 34.34 kg/m   Physical Exam Vitals and nursing note reviewed.  Constitutional:      Appearance: She is obese.  HENT:     Head: Normocephalic and atraumatic. No right periorbital erythema or left periorbital erythema.     Jaw: Pain on movement present.      Comments: There is erythematous papular rash over primarily the left side of the face from the chin all the way over to the forehead and into the scalp on the left side.  There are additionally erythematous papules on the right cheek and temporal area as well.  No vesicles visible, no drainage, no pain with palpation of the rash.  No crepitus. There is left-sided pain at the angle of the mandible with opening of the jaw.    Right Ear: Hearing normal.     Left Ear: Hearing normal.     Ears:     Comments: There is cerumen impaction of the auditory canals bilaterally, L>R. There are no lesions within the external canal  of the ears bilaterally.  TMs not visualized due to cerumen impaction.     Nose: Nose normal.     Mouth/Throat:     Mouth: Mucous membranes are dry.     Pharynx: Oropharynx is clear. Uvula midline. No oropharyngeal exudate, posterior oropharyngeal erythema or uvula swelling.     Tonsils: No tonsillar exudate.  Eyes:     General: Lids are normal. Vision  grossly intact. Gaze aligned appropriately.        Right eye: No discharge.        Left eye: No discharge.     Extraocular Movements: Extraocular movements intact.     Conjunctiva/sclera: Conjunctivae normal.     Pupils: Pupils are equal, round, and reactive to light.     Comments: Pain in left eye with EOMs.  Neck:     Trachea: Trachea and phonation  normal.  Cardiovascular:     Rate and Rhythm: Normal rate and regular rhythm.     Pulses: Normal pulses.          Radial pulses are 2+ on the right side and 2+ on the left side.       Dorsalis pedis pulses are 2+ on the right side and 2+ on the left side.     Heart sounds: Normal heart sounds. No murmur heard.   Pulmonary:     Effort: Pulmonary effort is normal. No respiratory distress.     Breath sounds: Normal breath sounds. No wheezing or rales.  Chest:     Chest wall: No lacerations, deformity, swelling, tenderness, crepitus or edema.  Abdominal:     General: Bowel sounds are normal. There is no distension.     Palpations: Abdomen is soft.     Tenderness: There is no abdominal tenderness. There is no guarding or rebound.  Musculoskeletal:        General: No deformity.     Cervical back: Normal range of motion and neck supple. No crepitus. No pain with movement, spinous process tenderness or muscular tenderness.     Right lower leg: No edema.     Left lower leg: No edema.  Lymphadenopathy:     Cervical: Cervical adenopathy present.     Right cervical: Superficial cervical adenopathy and deep cervical adenopathy present.     Left cervical: Superficial cervical adenopathy and deep cervical adenopathy present.  Skin:    General: Skin is warm and dry.     Capillary Refill: Capillary refill takes less than 2 seconds.     Findings: Rash present. Rash is papular.     Comments: No rash on the neck, trunk, or extremities.  Neurological:     Mental Status: She is alert and oriented to person, place, and time. Mental status is at  baseline.     Cranial Nerves: Cranial nerves are intact.     Sensory: Sensation is intact.     Motor: Motor function is intact.     Coordination: Coordination is intact.     Gait: Gait is intact.     Comments: Sensation and motor function are symmetric in the face.  There is no facial droop.  Psychiatric:        Mood and Affect: Mood normal.     ED Results / Procedures / Treatments   Labs (all labs ordered are listed, but only abnormal results are displayed) Labs Reviewed  BASIC METABOLIC PANEL - Abnormal; Notable for the following components:      Result Value   Glucose, Bld 115 (*)    All other components within normal limits  CBC WITH DIFFERENTIAL/PLATELET  I-STAT BETA HCG BLOOD, ED ([facility], [facility], [facility] ONLY)    EKG None  Radiology CT Maxillofacial W Contrast  Result Date: 07/24/2020 CLINICAL DATA:  Cellulitis, face. Mastoid tenderness and swelling. Additional history provided: Left-sided face pain which began below left ear 3 days ago. EXAM: CT MAXILLOFACIAL WITH CONTRAST TECHNIQUE: Multidetector CT imaging of the maxillofacial structures was performed with intravenous contrast. Multiplanar CT image reconstructions were also generated. CONTRAST:  75mL OMNIPAQUE IOHEXOL 300 MG/ML  SOLN COMPARISON:  No pertinent prior exams available for comparison. FINDINGS: Osseous: No maxillofacial fracture. No acute bony abnormality or aggressive osseous lesion. Orbits: No acute finding. The globes are normal in size and contour. The extraocular muscles and optic nerve sheath complexes are symmetric and unremarkable. Sinuses: Trace ethmoid and right maxillary sinus  mucosal thickening. Soft tissues: Subtle left maxillofacial and periauricular soft tissue inflammatory stranding is questioned. Lymph nodes are prominent in number within the neck bilaterally (greater on the left). Additionally, there are some lymph nodes within the left neck which are enlarged. An index left level 2 lymph node measures 15 mm in  short axis (series 2, image 60). Additionally, there are enlarged lymph nodes within the left greater than right parotid glands measuring up to 10 mm. There is no appreciable swelling or discrete mass within the oral cavity, pharynx or larynx. Postinflammatory calcifications are present within the left palatine tonsil. Limited intracranial: No acute intracranial abnormality is identified. Other: No appreciable middle ear or mastoid effusion. Debris within the left external auditory canal. IMPRESSION: Subtle left periauricular and maxillofacial soft tissue inflammatory stranding is questioned and findings may reflect the reported facial cellulitis. No soft tissue abscess is identified. Left greater than right cervical and intraparotid lymphadenopathy. Findings may be reactive in the setting of cellulitis. Close clinical follow-up is recommended (with imaging follow-up as warranted) to exclude alternative etiologies. No appreciable middle ear or mastoid effusion. Debris within the left external auditory canal. Minimal ethmoid and right maxillary sinus mucosal thickening. Electronically Signed   By: Jackey Loge DO   On: 07/24/2020 15:17    Procedures Procedures   Medications Ordered in ED Medications  oxyCODONE-acetaminophen (PERCOCET/ROXICET) 5-325 MG per tablet 1 tablet (1 tablet Oral Given 07/24/20 1044)  ketorolac (TORADOL) 30 MG/ML injection 30 mg (30 mg Intravenous Given 07/24/20 1330)  iohexol (OMNIPAQUE) 300 MG/ML solution 75 mL (75 mLs Intravenous Contrast Given 07/24/20 1410)  fentaNYL (SUBLIMAZE) injection 50 mcg (50 mcg Intravenous Given 07/24/20 1851)  cephALEXin (KEFLEX) capsule 500 mg (500 mg Oral Given 07/24/20 1852)    ED Course  I have reviewed the triage vital signs and the nursing notes.  Pertinent labs & imaging results that were available during my care of the patient were reviewed by me and considered in my medical decision making (see chart for details).    MDM  Rules/Calculators/A&P                         30 year old female with 3 days of progressive swelling and pain that starts at the left mastoid and extends along the angle of the left mandible, with associated itchy red papular rash in the face, primarily in the left side.  Differential diagnosis for this patient symptoms include but are not limited to facial cellulitis, auricular hematoma, Ramsay Hunt syndrome (herpes zoster oticus), malignant otitis externa, otitis externa, mastoiditis, bullous myringitis, odontogenic infection, impetigo.  Patient mildly tachycardic on intake to 108 bpm.  Vital signs otherwise normal.  Cardiopulmonary exam is normal, abdominal exam is benign.  HEENT exam revealed pain with opening of the jaw, left-sided facial swelling with tenderness to palpation of the mastoid on the left side.  TM exam unable to be performed due to cerumen impaction bilaterally.  Additionally there is erythematous itchy, papular rash on the face, around the left side, however it does extend onto the right side of the face as well.  Additionally there is pain in the left eye with extraocular movements, EOMI, PERRL.  Physical exam not consistent with Ramsay Hunt syndrome given nonvesicular rash that is distributed on both sides of the face and sparing of the auditory canal lesions.  Concern for mastoiditis given extent of mastoid tenderness to palpation on the left side.  Rash is atypical, most consistent with  allergic dermatitis, however may be an infectious component given concern for source of infection in the left side of the face as well.  Will proceed with basic laboratory studies in addition to CT maxillofacial, given concern for pain with EOMs and tenderness palpation of the left mastoid.  Discussed most appropriate imaging study with radiologist, Dr. Tyron Russell, who recommends contrasted maxillofacial CT.  I appreciate his collaboration in the care of this patient. Analgesia offered with improvement  in patient's headache.  CBC unremarkable, BMP unremarkable, CT maxillofacial revealed preauricular and maxillofacial soft tissue inflammation consistent with cellulitis.  No abscess identified.  Additionally there are is significant lymphadenopathy, but no evidence of preseptal or postseptal cellulitis, no evidence of mastoiditis at this time.  Given reassuring laboratory studies, physical exam, vital signs, and imaging, no further work-up is warranted in ED at this time.  Feels patient's rash is most consistent with allergic reaction, however do suspect that this patient has an early facial cellulitis.  For this reason patient was administered first dose of cephalexin in the emergency department and will be discharged with course of cephalexin as well.  Patient has good follow-up established with the health department, whom I recommend she call to schedule follow up early next week.  Gabriele voiced understanding of her medical evaluation and treatment plan.  Each of her questions was answered to expresses satisfaction.  Strict return precautions given.  Patient is stable and appropriate for discharge at this time.  This chart was dictated using voice recognition software, Dragon. Despite the best efforts of this provider to proofread and correct errors, errors may still occur which can change documentation meaning.  Final Clinical Impression(s) / ED Diagnoses Final diagnoses:  Cellulitis of face    Rx / DC Orders ED Discharge Orders         Ordered    cephALEXin (KEFLEX) 500 MG capsule  4 times daily        07/24/20 1728    hydrocortisone cream 1 %        07/24/20 1728           Mega Kinkade, Eugene Gavia, PA-C 07/24/20 1910    Mancel Bale, MD 07/25/20 1101

## 2020-07-24 NOTE — ED Notes (Signed)
Patient transported to CT 

## 2021-01-13 ENCOUNTER — Ambulatory Visit: Payer: BC Managed Care – PPO

## 2021-01-13 ENCOUNTER — Ambulatory Visit
Admission: EM | Admit: 2021-01-13 | Discharge: 2021-01-13 | Discharge status: Against Medical Advice | Payer: BC Managed Care – PPO

## 2021-01-16 ENCOUNTER — Ambulatory Visit
Admission: EM | Admit: 2021-01-16 | Discharge: 2021-01-16 | Disposition: A | Discharge status: Home / Self Care | Payer: BC Managed Care – PPO | Attending: Emergency Medicine | Admitting: Emergency Medicine

## 2021-01-16 DIAGNOSIS — R42 Dizziness and giddiness: Secondary | ICD-10-CM

## 2021-01-16 DIAGNOSIS — H8110 Benign paroxysmal vertigo, unspecified ear: Secondary | ICD-10-CM

## 2021-01-16 MED ORDER — MECLIZINE HCL 50 MG PO TABS: 50.0000 mg | ORAL_TABLET | Freq: Two times a day (BID) | ORAL | 0 refills | Status: DC | PRN

## 2021-01-16 MED ORDER — PREDNISONE 20 MG PO TABS: 20.0000 mg | ORAL_TABLET | Freq: Two times a day (BID) | ORAL | 0 refills | Status: AC

## 2021-01-16 MED ORDER — ONDANSETRON HCL 4 MG PO TABS: 4.0000 mg | ORAL_TABLET | Freq: Four times a day (QID) | ORAL | 0 refills | Status: DC

## 2021-01-16 NOTE — ED Triage Notes (Signed)
Pt presents with c/o dizziness that began Monday, becomes worse with movement

## 2021-01-16 NOTE — Discharge Instructions (Signed)
Prednisone prescribed.  Take as directed and to completion Meclizine prescribed.  Take as directed for symptomatic relief.  This medication may make you drowsy so use with cautions while driving or operating heavy machinery. Zofran prescribed.  Take as needed for nausea Follow up with PCP if symptoms persists Return or go to the ER if you have any new or worsening symptoms chest pain, shortness of breath, passing out, vision changes, slurred speech, facial droop, weakness, symptoms not improved with medications, etc..Marland Kitchen

## 2021-01-16 NOTE — ED Provider Notes (Signed)
[facility]   [id number] 01/16/21 Arrival Time: 0931  OI:[id number]  SUBJECTIVE:  Linda Pollard is a 30 y.o. female who presents with complaint of dizziness that began 4 days ago.  Denies a precipitating event, trauma, or recent URI within the past month.  Describes the dizziness as "being on a boat."  States that it is intermittent with episodes lasting a few minutes at a time.  Has NOT tried OTC medications without relief.  Symptoms made worse with positions changes and movement.  Denies to previous symptoms.  Complains of associated tinnitus, and nausea.   Denies fever, chills, vomiting, hearing changes, ear pain, chest pain, syncope, SOB, weakness, slurred speech, memory or emotional changes, facial drooping/ asymmetry, incoordination, numbness or tingling, abdominal pain, changes in bowel or bladder habits.    ROS: As per HPI.  All other pertinent ROS negative.    Past Medical History:  Diagnosis Date   Gallstone    Nexplanon insertion 01/01/2014   nexplanon inserted 01/01/14 left arm remove 01/01/17   Pregnancy with one fetus    Past Surgical History:  Procedure Laterality Date   CHOLECYSTECTOMY  12/03/2011   Procedure: LAPAROSCOPIC CHOLECYSTECTOMY;  Surgeon: Dalia Heading, MD;  Location: [facility];  Service: General;  Laterality: N/A;   NO PAST SURGERIES     No Known Allergies No current facility-administered medications on file prior to encounter.   Current Outpatient Medications on File Prior to Encounter  Medication Sig Dispense Refill   hydrocortisone cream 1 % Apply to affected area 2 times daily 15 g 0   omeprazole (PRILOSEC) 20 MG capsule Take 1 capsule (20 mg total) by mouth daily. (Patient not taking: Reported on 07/24/2020) 30 capsule 0   [DISCONTINUED] fluticasone (FLONASE) 50 MCG/ACT nasal spray Place 2 sprays into both nostrils daily. 16 g 0   [DISCONTINUED] sucralfate (CARAFATE) 1 GM/10ML suspension Take 10 mLs (1 g total) by mouth 4 (four) times daily -   with meals and at bedtime. 420 mL 0   Social History   Socioeconomic History   Marital status: Married    Spouse name: Not on file   Number of children: Not on file   Years of education: Not on file   Highest education level: Not on file  Occupational History   Not on file  Tobacco Use   Smoking status: Never   Smokeless tobacco: Never  Vaping Use   Vaping Use: Never used  Substance and Sexual Activity   Alcohol use: No   Drug use: No   Sexual activity: Not Currently    Birth control/protection: None  Other Topics Concern   Not on file  Social History Narrative   Not on file   Social Determinants of Health   Financial Resource Strain: Not on file  Food Insecurity: Not on file  Transportation Needs: Not on file  Physical Activity: Not on file  Stress: Not on file  Social Connections: Not on file  Intimate Partner Violence: Not on file   Family History  Problem Relation Age of Onset   Healthy Father    Healthy Mother    Anesthesia problems Neg Hx     OBJECTIVE:  Vitals:   01/16/21 1031  BP: 123/78  Pulse: 92  Resp: 20  Temp: 99 F (37.2 C)  SpO2: 92%    General appearance: alert; no distress Eyes: PERRLA; EOMI; conjunctiva normal HENT: normocephalic; atraumatic; TMs normal; nasal mucosa normal; oral mucosa normal Neck: supple with FROM Lungs: clear to  auscultation bilaterally Heart: regular rate and rhythm Abdomen: soft, non-tender; bowel sounds normal Extremities: no cyanosis or edema; symmetrical with no gross deformities Skin: warm and dry Neurologic: normal gait; strength intact about the UE and LE; CN 2-12 grossly intact Psychological: alert and cooperative; normal mood and affect  ASSESSMENT & PLAN:  1. Dizziness and giddiness   2. Benign paroxysmal positional vertigo, unspecified laterality     Meds ordered this encounter  Medications   predniSONE (DELTASONE) 20 MG tablet    Sig: Take 1 tablet (20 mg total) by mouth 2 (two) times daily  with a meal for 5 days.    Dispense:  10 tablet    Refill:  0    Order Specific Question:   Supervising Provider    Answer:   Eustace Moore [[id number]]   ondansetron (ZOFRAN) 4 MG tablet    Sig: Take 1 tablet (4 mg total) by mouth every 6 (six) hours.    Dispense:  12 tablet    Refill:  0    Order Specific Question:   Supervising Provider    Answer:   Eustace Moore [[id number]]   meclizine (ANTIVERT) 50 MG tablet    Sig: Take 1 tablet (50 mg total) by mouth 2 (two) times daily as needed.    Dispense:  30 tablet    Refill:  0    Order Specific Question:   Supervising Provider    Answer:   Eustace Moore [[id number]]   Prednisone prescribed.  Take as directed and to completion Meclizine prescribed.  Take as directed for symptomatic relief.  This medication may make you drowsy so use with cautions while driving or operating heavy machinery. Zofran prescribed.  Take as needed for nausea Follow up with PCP if symptoms persists Return or go to the ER if you have any new or worsening symptoms chest pain, shortness of breath, passing out, vision changes, slurred speech, facial droop, weakness, symptoms not improved with medications, etc...  Reviewed expectations re: course of current medical issues. Questions answered. Outlined signs and symptoms indicating need for more acute intervention. Patient verbalized understanding. After Visit Summary given.     Rennis Harding, PA-C 01/16/21 1125

## 2021-09-22 ENCOUNTER — Encounter: Payer: Self-pay | Admitting: Advanced Practice Midwife

## 2021-09-22 ENCOUNTER — Other Ambulatory Visit (HOSPITAL_COMMUNITY)
Admission: RE | Admit: 2021-09-22 | Discharge: 2021-09-22 | Disposition: A | Discharge status: Home / Self Care | Payer: BC Managed Care – PPO | Source: Ambulatory Visit | Attending: Advanced Practice Midwife | Admitting: Advanced Practice Midwife

## 2021-09-22 ENCOUNTER — Ambulatory Visit (INDEPENDENT_AMBULATORY_CARE_PROVIDER_SITE_OTHER): Payer: BC Managed Care – PPO | Admitting: Advanced Practice Midwife

## 2021-09-22 VITALS — BP 103/68 | HR 79 | Ht 59.0 in | Wt 184.0 lb

## 2021-09-22 DIAGNOSIS — R32 Unspecified urinary incontinence: Secondary | ICD-10-CM

## 2021-09-22 DIAGNOSIS — F32A Depression, unspecified: Secondary | ICD-10-CM | POA: Diagnosis not present

## 2021-09-22 DIAGNOSIS — Z01419 Encounter for gynecological examination (general) (routine) without abnormal findings: Secondary | ICD-10-CM

## 2021-09-22 DIAGNOSIS — N39498 Other specified urinary incontinence: Secondary | ICD-10-CM

## 2021-09-22 NOTE — Progress Notes (Signed)
? ?WELL-WOMAN EXAMINATION ?Patient name: Linda Pollard MRN [id number]  Date of birth: 08/12/1990 ?Chief Complaint:   ?Gynecologic Exam and Annual Exam ? ?History of Present Illness:   ?Linda Pollard is a 31 y.o. [id number] Hispanic female being seen today for a routine well-woman exam.  ?Current complaints: frequent urinary incontinence approx a couple times/wk; recently started on Adderall for depression a couple of weeks ago; has monthly cycles but they come at irreg intervals ? ?PCP: [facility]      ?does not desire labs ?Patient's last menstrual period was 09/14/2021 (exact date). ?The current method of family planning is  condoms with husband planning vas .  ?Last pap >50yrs. Results were: NILM w/ HRHPV not done. H/O abnormal pap: no ?Last mammogram: never. Results were: N/A. Family h/o breast cancer: no ?Last colonoscopy: never. Results were: N/A. Family h/o colorectal cancer: no ? ? ?  09/22/2021  ?  9:28 AM  ?Depression screen PHQ 2/9  ?Decreased Interest 2  ?Down, Depressed, Hopeless 0  ?PHQ - 2 Score 2  ?Altered sleeping 1  ?Tired, decreased energy 0  ?Change in appetite 0  ?Feeling bad or failure about yourself  0  ?Trouble concentrating 3  ?Moving slowly or fidgety/restless 0  ?Suicidal thoughts 0  ?PHQ-9 Score 6  ? ?  ? ?  09/22/2021  ?  9:28 AM  ?GAD 7 : Generalized Anxiety Score  ?Nervous, Anxious, on Edge 0  ?Control/stop worrying 1  ?Worry too much - different things 1  ?Trouble relaxing 2  ?Restless 0  ?Easily annoyed or irritable 2  ?Afraid - awful might happen 0  ?Total GAD 7 Score 6  ? ? ? ?Review of Systems:   ?Pertinent items are noted in HPI ?Denies any headaches, blurred vision, fatigue, shortness of breath, chest pain, abdominal pain, abnormal vaginal discharge/itching/odor/irritation, problems with periods, bowel movements, urination, or intercourse unless otherwise stated above. ?Pertinent History Reviewed:  ?Reviewed past medical,surgical, social and family history.  ?Reviewed  problem list, medications and allergies. ?Physical Assessment:  ? ?Vitals:  ? 09/22/21 0908  ?BP: 103/68  ?Pulse: 79  ?Weight: 184 lb (83.5 kg)  ?Height: 4\' 11"  (1.499 m)  ?Body mass index is 37.16 kg/m?. ?  ?     Physical Examination:  ? General appearance - well appearing, and in no distress ? Mental status - alert, oriented to person, place, and time ? Psych:  She has a normal mood and affect ? Skin - warm and dry, normal color, no suspicious lesions noted ? Chest - effort normal, all lung fields clear to auscultation bilaterally ? Heart - normal rate and regular rhythm ? Neck:  midline trachea, no thyromegaly or nodules ? Breasts - breasts appear normal, no suspicious masses, no skin or nipple changes or  axillary nodes ? Abdomen - soft, nontender, nondistended, no masses or organomegaly ? Pelvic - VULVA: normal appearing vulva with no masses, tenderness or lesions  VAGINA: normal appearing vagina with normal color and discharge, no lesions  CERVIX: normal appearing cervix without discharge or lesions, no CMT ? Thin prep pap is done with HR HPV cotesting ? UTERUS: uterus is felt to be normal size, shape, consistency and nontender  ? ADNEXA: No adnexal masses or tenderness noted. ? Rectal - not done ? Extremities:  No swelling or varicosities noted ? ?Chaperone: Neas   ? ?No results found for this or any previous visit (from the past 24 hour(s)).  ?Assessment & Plan:  ?1) Well-Woman  Exam ? ?2) Urinary incontinence, reviewed starting Kegal exercises (given instruction and written info); interested in PT referral- ordered ? ?Labs/procedures today: Pap ? ?Mammogram: @ 31yo, or sooner if problems ?Colonoscopy: @ 31yo, or sooner if problems ? ?Orders Placed This Encounter  ?Procedures  ? Ambulatory referral to Physical Therapy  ? ? ?Meds: No orders of the defined types were placed in this encounter. ? ? ?Follow-up: Return in about 1 year (around 09/23/2022) for Physical. ? ?Arabella Merles CNM,  WHNP-BC ?09/22/2021 ?9:45 AM  ?

## 2021-09-23 LAB — CYTOLOGY - PAP
Comment: NEGATIVE
Diagnosis: NEGATIVE
High risk HPV: NEGATIVE

## 2021-09-24 NOTE — Therapy (Addendum)
?OUTPATIENT PHYSICAL THERAPY FEMALE PELVIC EVALUATION ? ? ?Patient Name: Linda Pollard ?MRN: [id number] ?DOB:[birth year], 31 y.o., female ?Today's Date: 10/20/2021 ? ? ? ? ?Past Medical History:  ?Diagnosis Date  ? ADHD   ? Anxiety   ? Gallstone   ? Nexplanon insertion 01/01/2014  ? nexplanon inserted 01/01/14 left arm remove 01/01/17  ? Pregnancy with one fetus   ? ?Past Surgical History:  ?Procedure Laterality Date  ? CHOLECYSTECTOMY  12/03/2011  ? Procedure: LAPAROSCOPIC CHOLECYSTECTOMY;  Surgeon: Dalia Heading, MD;  Location: [facility];  Service: General;  Laterality: N/A;  ? NO PAST SURGERIES    ? ?Patient Active Problem List  ? Diagnosis Date Noted  ? Depression 09/22/2021  ? Urinary incontinence 09/22/2021  ? ? ?[facility]: [facility] ? ?REFERRING PROVIDER: Arabella Merles, CNM ? ?REFERRING DIAG: S06.301 (ICD-10-CM) - Other urinary incontinence ? ?THERAPY DIAG:  ?Muscle weakness (generalized) - Plan: PT plan of care cert/re-cert ? ?Other muscle spasm - Plan: PT plan of care cert/re-cert ? ?Unspecified lack of coordination - Plan: PT plan of care cert/re-cert ? ?ONSET DATE: 2015 ? ?SUBJECTIVE:                                                                                                                                                                                          ? ?SUBJECTIVE STATEMENT: ?Pt stats that she has urinary incontinence when she laughing/coughing/sneezing and sit<>stand. She denies pain, but states that sometimes she has sharpness in lower abdomen randomly and with intercourse. Pt also ended up reporting increased Rt anterior hip pain when performing stretches at end of session and states that she has referral to orthopedic MD for assessment.  ?Fluid intake: Yes: half gallon a day   ? ?Patient confirms identification and approves PT to assess pelvic floor and treatment Yes ? ? ?PAIN:  ?Are you having pain? No ? ? ?PRECAUTIONS: None ? ?WEIGHT BEARING RESTRICTIONS No ? ?FALLS:   ?Has patient fallen in last 6 months? No ? ?LIVING ENVIRONMENT: ?Lives with: lives with their family ?Lives in: House/apartment ? 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 ?PLOF: Independent ? ?PATIENT GOALS decrease leaking ? ?PERTINENT HISTORY:  ?cholecystectomy ?Sexual abuse: No ? ?BOWEL MOVEMENT ?Pain with bowel movement: No ?Type of bowel movement:Frequency 2x/day and Strain No ?Fully empty rectum: No - sometimes ?Leakage: No ?Pads: No ?Fiber supplement: No ? ?URINATION ?Pain with urination: No ?Fully empty bladder: Yes: - ?Stream:  WNL ?Urgency: No usually can hold it ?Frequency: every 3 hours ?Leakage: Coughing, Sneezing, Laughing, and Bending forward ?Pads: No ? ?INTERCOURSE ?Pain with intercourse: During Penetration - occasionally ?Ability to have  vaginal penetration:  Yes: - ?Climax: yes - she does remember a time when orgasm was painful up into lower abdomen ?No lubrication ? ? ?PREGNANCY ?Vaginal deliveries 2 ?Tearing Yes: stitches ?C-section deliveries 0 ?Currently pregnant No ? ? ? ? ?OBJECTIVE 09/25/21:  ? ?COGNITION: ? Overall cognitive status: Within functional limits for tasks assessed   ?  ?SENSATION: ? Light touch: Appears intact ? Proprioception: Appears intact ? ? ?PELVIC MMT: ?1/5 with cuing - initially bearing down with breath holding; endurance and repetitions not assessed due to difficulty with coordination, tension, and lack of contraction.  ? ? ?      PALPATION: ?  General  no abdominal tenderness ? ?              External Perineal Exam WNL ?              ?              Internal Pelvic Floor tenderness throughout Rt superficial and deep pelvic floor with notable tension; some tenderness in deep Lt pelvic floor, but less notable ? ?TONE: ?High - Rt>LT ? ?PROLAPSE: ?None detected this treatment session  ? ?TODAY'S TREATMENT 09/25/21 ?EVAL  ?Manual: ?Soft tissue mobilization: ?Scar tissue mobilization: ?Myofascial release: ?Spinal mobilization: ?Internal pelvic floor techniques: ?Dry  needling: ?Neuromuscular re-education: ?Core retraining:  ?Core facilitation: ?Form correction: ?Pelvic floor contraction training: ?Down training: ?Diaphragmatic breathing with multimodal cues for improved pelvic floor relaxation ?Cat cow 2 x 10 ?Child's pose 10 breaths ?Happy baby - stopped due to Rt hip pain          ?Exercises: ?Stretches/mobility: ?Hip flexor stretch kneeling 60 sec bil ?Strengthening: ?Therapeutic activities: ?Functional strengthening activities: ?Self-care: ?Education on pelvic floor, hip, core anatomy and how chronic Rt hip pain most likely impacting pelvic floor dysfunction ? ? ? ?PATIENT EDUCATION:  ?Education details: Pelvic floor, core, hip anatomy education; initial HEP; details of treatment ?Person educated: Patient ?Education method: Explanation, Demonstration, Tactile cues, Verbal cues, and Handouts ?Education comprehension: verbalized understanding ? ? ?HOME EXERCISE PROGRAM: ?[id number] ? ?ASSESSMENT: ? ?CLINICAL IMPRESSION: ?Patient is a 31 y.o. female who was seen today for physical therapy evaluation and treatment for stress urinary incontinence and secondary complaint of lower abdominal/Rt hip pain. Exam findings notable for severe difficulty with findings pelvic floor contraction/coordination, pelvic floor weakness 1/5 with multimodal cuing, significant tone in superficial/deep Rt pelvic floor muscles and mild tone increase in Lt deep pelvic floor; through stretches, pt reported the chronic Rt hip pain and we discussed how this is most likely related source of increased pelvic floor tension on Rt side - we will plan to further assess next treatment session. Signs and symptoms are most likely due to a combination of pelvic floor tension due to Rt hip dysfunction and pelvic floor weakness/poor coordination. Initial treatment consisted of stretches/down training to help improve pelvic floor lengthening. She will benefit from skilled PT intervention in order to address impairments,  decrease pain, decrease urinary incontinence, and improve quality of life.  ? ? ?OBJECTIVE IMPAIRMENTS decreased activity tolerance, decreased coordination, decreased endurance, decreased mobility, decreased strength, hypomobility, increased fascial restrictions, increased muscle spasms, impaired tone, postural dysfunction, and pain.  ? ?ACTIVITY LIMITATIONS  intercourse, jumping jacks, increased abdominal pressure .  ? ?PERSONAL FACTORS 1-2 comorbidities: 2 vaginal deliveries with tearing and cholecystectomy  are also affecting patient's functional outcome.  ? ? ?REHAB POTENTIAL: Good ? ?CLINICAL DECISION MAKING: Evolving/moderate complexity ? ?EVALUATION COMPLEXITY: Moderate ? ? ?GOALS: ?Goals reviewed  with patient? Yes ? ?SHORT TERM GOALS: Target date: 11/17/2021 ? ?Pt will be independent with HEP.  ? ?Baseline: ?Goal status: INITIAL ? ?2.  Pt will be independent with the knack, urge suppression technique, and double voiding in order to improve bladder habits and decrease urinary incontinence.  ? ?Baseline:  ?Goal status: INITIAL ? ? ? ?LONG TERM GOALS: Target date: 01/12/2022 ? ?Pt will be independent with advanced HEP.  ? ?Baseline:  ?Goal status: INITIAL ? ?2.  Pt will restore normal Rt hip A/ROM to improve pelvic mobility and decrease pain with activity.  ?Baseline:  ?Goal status: INITIAL ? ?3.  Pt will demonstrate increase in all impaired hip strength by 1 muscle grades in order to demonstrate improved lumbopelvic support and increase functional ability.  ? ?Baseline:  ?Goal status: INITIAL ? ?4.  Pt will demonstrate normal pelvic floor muscle tone and A/ROM, able to achieve 3/5 strength with contractions and 10 sec endurance, in order to provide appropriate lumbopelvic support in functional activities.  ? ?Baseline:  ?Goal status: INITIAL ? ?5.  Pt will report no leaks with laughing, coughing, sneezing in order to improve comfort with interpersonal relationships and community activities.  ? ?Baseline:  ?Goal  status: INITIAL ? ?6.  Pt will report no increase in lower abdominal/Rt hip pain with intercourse, jumping jacks, playing with kids, or any functional activity in order to improve QOL.  ?Baseline:  ?Goal status: IN

## 2021-09-25 ENCOUNTER — Ambulatory Visit: Payer: BC Managed Care – PPO | Attending: Advanced Practice Midwife

## 2021-09-25 DIAGNOSIS — N39498 Other specified urinary incontinence: Secondary | ICD-10-CM | POA: Insufficient documentation

## 2021-09-25 DIAGNOSIS — R279 Unspecified lack of coordination: Secondary | ICD-10-CM | POA: Diagnosis not present

## 2021-09-25 DIAGNOSIS — M62838 Other muscle spasm: Secondary | ICD-10-CM | POA: Diagnosis not present

## 2021-09-25 DIAGNOSIS — M6281 Muscle weakness (generalized): Secondary | ICD-10-CM | POA: Diagnosis not present

## 2021-10-19 NOTE — Therapy (Signed)
?OUTPATIENT PHYSICAL THERAPY TREATMENT NOTE ? ? ?Patient Name: Linda Pollard ?MRN: [id number] ?DOB:1990-09-18, 31 y.o., female ?Today's Date: 10/20/2021 ? ?[facility] ?REFERRING PROVIDER: Arabella MerlesShaw, Kimberly D, CNM ? ?END OF SESSION:  ? PT End of Session - 10/20/21 [id number]   ? ? Visit Number 2   ? Date for PT Re-Evaluation 12/18/21   ? Authorization Type [employer]   ? PT Start Time [phone]   ? PT Stop Time [phone]   ? PT Time Calculation (min) 45 min   ? Activity Tolerance Patient tolerated treatment well   ? Behavior [facility] for tasks assessed/performed   ? ?  ?  ? ?  ? ? ?Past Medical History:  ?Diagnosis Date  ? ADHD   ? Anxiety   ? Gallstone   ? Nexplanon insertion 01/01/2014  ? nexplanon inserted 01/01/14 left arm remove 01/01/17  ? Pregnancy with one fetus   ? ?Past Surgical History:  ?Procedure Laterality Date  ? CHOLECYSTECTOMY  12/03/2011  ? Procedure: LAPAROSCOPIC CHOLECYSTECTOMY;  Surgeon: Dalia HeadingMark A Jenkins, MD;  Location: [facility];  Service: General;  Laterality: N/A;  ? NO PAST SURGERIES    ? ?Patient Active Problem List  ? Diagnosis Date Noted  ? Depression 09/22/2021  ? Urinary incontinence 09/22/2021  ? ? ?REFERRING DIAG: N39.498 (ICD-10-CM) - Other urinary incontinence ? ?THERAPY DIAG:  ?Muscle weakness (generalized) ? ?Other muscle spasm ? ?Unspecified lack of coordination ? ?PERTINENT HISTORY: cholecystectomy, 2 vaginal deliveries ? ?PRECAUTIONS: NA ? ?SUBJECTIVE: Patient states that she is noticing an improvement in Rt LE pain with decreased frequency, but the leaking is still about the same.  ? ? ?SUBJECTIVE STATEMENT: ?Pt stats that she has urinary incontinence when she laughing/coughing/sneezing and sit<>stand. She denies pain, but states that sometimes she has sharpness in lower abdomen randomly and with intercourse. Pt also ended up reporting increased Rt anterior hip pain when performing stretches at end of session and states that she has referral to orthopedic MD for  assessment.  ?Fluid intake: Yes: half gallon a day   ?  ?  ?PATIENT GOALS decrease leaking ?  ?PERTINENT HISTORY:  ?cholecystectomy ?Sexual abuse: No ?  ?BOWEL MOVEMENT ?Pain with bowel movement: No ?Type of bowel movement:Frequency 2x/day and Strain No ?Fully empty rectum: No - sometimes ?Leakage: No ?Pads: No ?Fiber supplement: No ?  ?URINATION ?Pain with urination: No ?Fully empty bladder: Yes: - ?Stream:  WNL ?Urgency: No usually can hold it ?Frequency: every 3 hours ?Leakage: Coughing, Sneezing, Laughing, and Bending forward ?Pads: No ?  ?INTERCOURSE ?Pain with intercourse: During Penetration - occasionally ?Ability to have vaginal penetration:  Yes: - ?Climax: yes - she does remember a time when orgasm was painful up into lower abdomen ?No lubrication ?  ?  ?PREGNANCY ?Vaginal deliveries 2 ?Tearing Yes: stitches ?C-section deliveries 0 ?Currently pregnant No ?  ?  ?  ?  ?OBJECTIVE 10/20/21: Squat with large Rt weight shift; single leg stance 10 sec Bil but large increase in instability on Rt compared to Lt; Rt hip strength grossly 3/5 with flexion, IR, and abduction causing anterior Rt hip pain; Lt hip strength grossly 4+/5 with exception of abduction 3/5; trigger points throughout Rt glutes and Rt hip flexors with tenderness to palpation;  Scour/FAIR (+) on Rt ? ?09/25/21:  ?  ?COGNITION: ?           Overall cognitive status: Within functional limits for tasks assessed              ?            ?  SENSATION: ?           Light touch: Appears intact ?           Proprioception: Appears intact ?  ?  ?PELVIC MMT: ?1/5 with cuing - initially bearing down with breath holding; endurance and repetitions not assessed due to difficulty with coordination, tension, and lack of contraction.  ?  ?  ?      PALPATION: ?  General  no abdominal tenderness ?  ?              External Perineal Exam WNL ?              ?              Internal Pelvic Floor tenderness throughout Rt superficial and deep pelvic floor with notable tension;  some tenderness in deep Lt pelvic floor, but less notable ?  ?TONE: ?High - Rt>LT ?  ?PROLAPSE: ?None detected this treatment session  ?  ?TODAY'S TREATMENT 10/20/21: ?Manual: ?Soft tissue mobilization: ?Posterolateral Rt hip mm and anterior hip mm ?Scar tissue mobilization: ?Myofascial release: ?Spinal mobilization: ?Internal pelvic floor techniques: ?Dry needling: ?Verbal consent provided from patient ?Rt glutes, piriformis, hip flexors ?Neuromuscular re-education: ?Core retraining:  ?Core facilitation: ?Form correction: ?Pelvic floor contraction training: ?Down training: ?Exercises: ?Stretches/mobility: ?Piriformis stretch 60 sec bil ?Hip flexor stretch 60 sec bil ?Strengthening: ?Clam shells 2 x 10 ?Straight leg raise 10x bil ?Therapeutic activities: ?Functional strengthening activities: ?Self-care: ? ? ? ?TREATMENT 09/25/21 ?EVAL  ?Manual: ?Soft tissue mobilization: ?Scar tissue mobilization: ?Myofascial release: ?Spinal mobilization: ?Internal pelvic floor techniques: ?Dry needling: ?Neuromuscular re-education: ?Core retraining:  ?Core facilitation: ?Form correction: ?Pelvic floor contraction training: ?Down training: ?Diaphragmatic breathing with multimodal cues for improved pelvic floor relaxation ?Cat cow 2 x 10 ?Child's pose 10 breaths ?Happy baby - stopped due to Rt hip pain                                                                                                  ?Exercises: ?Stretches/mobility: ?Hip flexor stretch kneeling 60 sec bil ?Strengthening: ?Therapeutic activities: ?Functional strengthening activities: ?Self-care: ?Education on pelvic floor, hip, core anatomy and how chronic Rt hip pain most likely impacting pelvic floor dysfunction ?  ?  ?  ?PATIENT EDUCATION:  ?Education details: DN and how pelvic floor is contributing to Rt hip pain; explanation of Rt hip exam findings ?Person educated: Patient ?Education method: Explanation, Demonstration, Tactile cues, Verbal cues, and  Handouts ?Education comprehension: verbalized understanding ?  ?  ?HOME EXERCISE PROGRAM: ?[id number] ?  ?ASSESSMENT: ?  ?CLINICAL IMPRESSION: ?Rt hip assessed further today with findings of significant muscular tension anteriorly/posteriorly; she also has some signs and symptoms of internal hip joint pathology, but with muscular imbalance surrounding hip joint, hard to tell at this point. We discussed how internal pelvic floor tension on Rt side is also contributing to altered joint mechanics. DN performed to anterior/posterior hip with good tolerance and immediate improvement in pain following treatment. Followed by manual techniques to help improve residual soreness and further improve muscular tension. Tolerated gentle strengthening and  stretching well; kneeling hip flexor stretch comfortable this session. She will benefit from skilled PT intervention in order to address impairments, decrease pain, decrease urinary incontinence, and improve quality of life.  ?  ?  ?OBJECTIVE IMPAIRMENTS decreased activity tolerance, decreased coordination, decreased endurance, decreased mobility, decreased strength, hypomobility, increased fascial restrictions, increased muscle spasms, impaired tone, postural dysfunction, and pain.  ?  ?ACTIVITY LIMITATIONS  intercourse, jumping jacks, increased abdominal pressure .  ?  ?PERSONAL FACTORS 1-2 comorbidities: 2 vaginal deliveries with tearing and cholecystectomy  are also affecting patient's functional outcome.  ?  ?  ?REHAB POTENTIAL: Good ?  ?CLINICAL DECISION MAKING: Evolving/moderate complexity ?  ?EVALUATION COMPLEXITY: Moderate ?  ?  ?GOALS: ?Goals reviewed with patient? Yes ?  ?SHORT TERM GOALS: Target date: 10/23/2021 ?  ?Pt will be independent with HEP.  ?  ?Baseline: ?Goal status: INITIAL ?  ?2.  Pt will be independent with the knack, urge suppression technique, and double voiding in order to improve bladder habits and decrease urinary incontinence.  ?  ?Baseline:  ?Goal  status: INITIAL ?  ?  ?  ?LONG TERM GOALS: Target date: 12/18/2021 ?  ?Pt will be independent with advanced HEP.  ?  ?Baseline:  ?Goal status: INITIAL ?  ?2.  Pt will restore normal Rt hip A/ROM to improve pelvic mobility and decr

## 2021-10-20 ENCOUNTER — Ambulatory Visit: Payer: BC Managed Care – PPO | Attending: Advanced Practice Midwife

## 2021-10-20 DIAGNOSIS — R279 Unspecified lack of coordination: Secondary | ICD-10-CM | POA: Diagnosis present

## 2021-10-20 DIAGNOSIS — M62838 Other muscle spasm: Secondary | ICD-10-CM | POA: Diagnosis present

## 2021-10-20 DIAGNOSIS — M6281 Muscle weakness (generalized): Secondary | ICD-10-CM | POA: Insufficient documentation

## 2021-10-20 NOTE — Patient Instructions (Signed)

## 2021-10-27 ENCOUNTER — Ambulatory Visit: Payer: BC Managed Care – PPO

## 2021-10-27 DIAGNOSIS — M6281 Muscle weakness (generalized): Secondary | ICD-10-CM | POA: Diagnosis not present

## 2021-10-27 DIAGNOSIS — M62838 Other muscle spasm: Secondary | ICD-10-CM

## 2021-10-27 DIAGNOSIS — R279 Unspecified lack of coordination: Secondary | ICD-10-CM

## 2021-10-27 NOTE — Therapy (Signed)
OUTPATIENT PHYSICAL THERAPY TREATMENT NOTE   Patient Name: Linda Pollard MRN: [id number] DOB:09/23/1990, 31 y.o., female Today's Date: 10/27/2021  PCP: [facility] REFERRING PROVIDER: Arabella Merles, CNM  END OF SESSION:   PT End of Session - 10/27/21 0802     Visit Number 3    Date for PT Re-Evaluation 12/18/21    Authorization Type BCBS    PT Start Time 0801    PT Stop Time 0840    PT Time Calculation (min) 39 min    Activity Tolerance Patient tolerated treatment well    Behavior During Therapy [facility] for tasks assessed/performed              Past Medical History:  Diagnosis Date   ADHD    Anxiety    Gallstone    Nexplanon insertion 01/01/2014   nexplanon inserted 01/01/14 left arm remove 01/01/17   Pregnancy with one fetus    Past Surgical History:  Procedure Laterality Date   CHOLECYSTECTOMY  12/03/2011   Procedure: LAPAROSCOPIC CHOLECYSTECTOMY;  Surgeon: Dalia Heading, MD;  Location: [facility];  Service: General;  Laterality: N/A;   NO PAST SURGERIES     Patient Active Problem List   Diagnosis Date Noted   Depression 09/22/2021   Urinary incontinence 09/22/2021    REFERRING DIAG: N39.498 (ICD-10-CM) - Other urinary incontinence  THERAPY DIAG:  Muscle weakness (generalized)  Other muscle spasm  Unspecified lack of coordination  PERTINENT HISTORY: cholecystectomy, 2 vaginal deliveries  PRECAUTIONS: NA  SUBJECTIVE: Pt states that urinary symptoms are about he same from last time. She just got off her period, so she is trying to see how things are doing. She states that she can move Rt lower extremity more this week, but she is having more pain after the first day the DN was performed.    SUBJECTIVE STATEMENT: Pt stats that she has urinary incontinence when she laughing/coughing/sneezing and sit<>stand. She denies pain, but states that sometimes she has sharpness in lower abdomen randomly and with intercourse. Pt also ended up  reporting increased Rt anterior hip pain when performing stretches at end of session and states that she has referral to orthopedic MD for assessment.  Fluid intake: Yes: half gallon a day       PATIENT GOALS decrease leaking   PERTINENT HISTORY:  cholecystectomy Sexual abuse: No   BOWEL MOVEMENT Pain with bowel movement: No Type of bowel movement:Frequency 2x/day and Strain No Fully empty rectum: No - sometimes Leakage: No Pads: No Fiber supplement: No   URINATION Pain with urination: No Fully empty bladder: Yes: - Stream:  WNL Urgency: No usually can hold it Frequency: every 3 hours Leakage: Coughing, Sneezing, Laughing, and Bending forward Pads: No   INTERCOURSE Pain with intercourse: During Penetration - occasionally Ability to have vaginal penetration:  Yes: - Climax: yes - she does remember a time when orgasm was painful up into lower abdomen No lubrication     PREGNANCY Vaginal deliveries 2 Tearing Yes: stitches C-section deliveries 0 Currently pregnant No         OBJECTIVE 10/20/21: Squat with large Rt weight shift; single leg stance 10 sec Bil but large increase in instability on Rt compared to Lt; Rt hip strength grossly 3/5 with flexion, IR, and abduction causing anterior Rt hip pain; Lt hip strength grossly 4+/5 with exception of abduction 3/5; trigger points throughout Rt glutes and Rt hip flexors with tenderness to palpation;  Scour/FAIR (+) on Rt  09/25/21:  COGNITION:            Overall cognitive status: Within functional limits for tasks assessed                          SENSATION:            Light touch: Appears intact            Proprioception: Appears intact     PELVIC MMT: 1/5 with cuing - initially bearing down with breath holding; endurance and repetitions not assessed due to difficulty with coordination, tension, and lack of contraction.            PALPATION:   General  no abdominal tenderness                 External Perineal Exam  WNL                             Internal Pelvic Floor tenderness throughout Rt superficial and deep pelvic floor with notable tension; some tenderness in deep Lt pelvic floor, but less notable   TONE: High - Rt>LT   PROLAPSE: None detected this treatment session    TODAY'S TREATMENT 10/27/21: Manual: Soft tissue mobilization: Posterolateral Rt hip mm and anterior hip mm Scar tissue mobilization: Myofascial release: Instrument assisted soft tissue anterior Rt thigh Spinal mobilization: Internal pelvic floor techniques: Dry needling: Verbal consent provided from patient Rt glutes, piriformis, hip flexors Neuromuscular re-education: Core retraining:  Core facilitation: Form correction: Pelvic floor contraction training: Down training: Exercises: Stretches/mobility: Strengthening: Bridge with ball squeeze 2 x 10 Clam shell 2 x 10 Therapeutic activities: Functional strengthening activities: Self-care:    TREATMENT 10/20/21: Manual: Soft tissue mobilization: Posterolateral Rt hip mm and anterior hip mm Scar tissue mobilization: Myofascial release: Spinal mobilization: Internal pelvic floor techniques: Dry needling: Verbal consent provided from patient Rt glutes, piriformis, hip flexors Neuromuscular re-education: Core retraining:  Core facilitation: Form correction: Pelvic floor contraction training: Down training: Exercises: Stretches/mobility: Piriformis stretch 60 sec bil Hip flexor stretch 60 sec bil Strengthening: Clam shells 2 x 10 Straight leg raise 10x bil Therapeutic activities: Functional strengthening activities: Self-care:    TREATMENT 09/25/21 EVAL  Manual: Soft tissue mobilization: Scar tissue mobilization: Myofascial release: Spinal mobilization: Internal pelvic floor techniques: Dry needling: Neuromuscular re-education: Core retraining:  Core facilitation: Form correction: Pelvic floor contraction training: Down  training: Diaphragmatic breathing with multimodal cues for improved pelvic floor relaxation Cat cow 2 x 10 Child's pose 10 breaths Happy baby - stopped due to Rt hip pain                                                                                                  Exercises: Stretches/mobility: Hip flexor stretch kneeling 60 sec bil Strengthening: Therapeutic activities: Functional strengthening activities: Self-care: Education on pelvic floor, hip, core anatomy and how chronic Rt hip pain most likely impacting pelvic floor dysfunction       PATIENT EDUCATION:  Education details: DN and how pelvic floor is contributing  to Rt hip pain; explanation of Rt hip exam findings Person educated: Patient Education method: Explanation, Demonstration, Tactile cues, Verbal cues, and Handouts Education comprehension: verbalized understanding     HOME EXERCISE PROGRAM: [id number]   ASSESSMENT:   CLINICAL IMPRESSION: Pt had an increase in pain over the last week after initially responding well to DN. Believe this could be due to not completely getting tightness out of muscle tissue and reflexive spasm. DN and manual techniques performed again with significant twitch response and release of soft tissue restriction. She reported significant soreness after session, but decrease in actual pain. Clam shells and progression of bridges with hip adduction performed with good tolerance. She will benefit from skilled PT intervention in order to address impairments, decrease pain, decrease urinary incontinence, and improve quality of life.      OBJECTIVE IMPAIRMENTS decreased activity tolerance, decreased coordination, decreased endurance, decreased mobility, decreased strength, hypomobility, increased fascial restrictions, increased muscle spasms, impaired tone, postural dysfunction, and pain.    ACTIVITY LIMITATIONS  intercourse, jumping jacks, increased abdominal pressure .    PERSONAL FACTORS 1-2  comorbidities: 2 vaginal deliveries with tearing and cholecystectomy  are also affecting patient's functional outcome.      REHAB POTENTIAL: Good   CLINICAL DECISION MAKING: Evolving/moderate complexity   EVALUATION COMPLEXITY: Moderate     GOALS: Goals reviewed with patient? Yes   SHORT TERM GOALS: Target date: 10/23/2021   Pt will be independent with HEP.    Baseline: Goal status: INITIAL   2.  Pt will be independent with the knack, urge suppression technique, and double voiding in order to improve bladder habits and decrease urinary incontinence.    Baseline:  Goal status: INITIAL       LONG TERM GOALS: Target date: 12/18/2021   Pt will be independent with advanced HEP.    Baseline:  Goal status: INITIAL   2.  Pt will restore normal Rt hip A/ROM to improve pelvic mobility and decrease pain with activity.  Baseline:  Goal status: INITIAL   3.  Pt will demonstrate increase in all impaired hip strength by 1 muscle grades in order to demonstrate improved lumbopelvic support and increase functional ability.    Baseline:  Goal status: INITIAL   4.  Pt will demonstrate normal pelvic floor muscle tone and A/ROM, able to achieve 3/5 strength with contractions and 10 sec endurance, in order to provide appropriate lumbopelvic support in functional activities.    Baseline:  Goal status: INITIAL   5.  Pt will report no leaks with laughing, coughing, sneezing in order to improve comfort with interpersonal relationships and community activities.    Baseline:  Goal status: INITIAL   6.  Pt will report no increase in lower abdominal/Rt hip pain with intercourse, jumping jacks, playing with kids, or any functional activity in order to improve QOL.  Baseline:  Goal status: INITIAL   PLAN: PT FREQUENCY: 1x/week   PT DURATION: 12 weeks   PLANNED INTERVENTIONS: Therapeutic exercises, Therapeutic activity, Neuromuscular re-education, Balance training, Gait training,  Patient/Family education, Joint mobilization, Dry Needling, Biofeedback, and Manual therapy   PLAN FOR NEXT SESSION: Continue DN/manual techniques to Rt hip as needed; plan to perform internal pelvic floor release; progress mobility/strengthening.    Julio Alm, PT, DPT05/23/238:42 AM

## 2021-11-16 ENCOUNTER — Ambulatory Visit (HOSPITAL_COMMUNITY)
Admission: RE | Admit: 2021-11-16 | Discharge: 2021-11-16 | Disposition: A | Discharge status: Home / Self Care | Payer: BC Managed Care – PPO | Source: Ambulatory Visit | Attending: Nurse Practitioner | Admitting: Nurse Practitioner

## 2021-11-16 DIAGNOSIS — Z136 Encounter for screening for cardiovascular disorders: Secondary | ICD-10-CM | POA: Insufficient documentation

## 2021-11-17 ENCOUNTER — Ambulatory Visit: Payer: BC Managed Care – PPO

## 2021-11-19 ENCOUNTER — Ambulatory Visit: Payer: BC Managed Care – PPO | Attending: Advanced Practice Midwife

## 2021-11-19 DIAGNOSIS — R279 Unspecified lack of coordination: Secondary | ICD-10-CM | POA: Diagnosis present

## 2021-11-19 DIAGNOSIS — M62838 Other muscle spasm: Secondary | ICD-10-CM | POA: Insufficient documentation

## 2021-11-19 DIAGNOSIS — M6281 Muscle weakness (generalized): Secondary | ICD-10-CM | POA: Diagnosis present

## 2021-11-19 NOTE — Patient Instructions (Signed)
The knack: Use this technique while coughing, laughing, sneezing, or with any activities that causes you to leak urine a little. Right before you perform one of these activities that increase pressure in the abdomen and pushes a little urine out, perform a pelvic floor muscle contraction and hold. If that does not completely stop the leaking, try tightening your thighs together in addition to performing a pelvic floor muscle contraction. Make sure you are not trying to stifle a cough, sneeze, or laugh; allow these activities in full as it will cause less pressure down into the bladder and pelvic floor muscles.   [facility] [address] [place], [place] [address] Phone # [phone] Fax [phone]

## 2021-11-19 NOTE — Therapy (Signed)
OUTPATIENT PHYSICAL THERAPY TREATMENT NOTE   Patient Name: Linda Pollard MRN: [id number] DOB:September 29, 1990, 31 y.o., female Today's Date: 11/19/2021  PCP: [facility] [place] PROVIDER: Myrtis Ser, CNM  END OF SESSION:   PT End of Session - 11/19/21 1409     Visit Number 4    Date for PT Re-Evaluation 12/18/21    Authorization Type BCBS    PT Start Time 0206    PT Stop Time 0240    PT Time Calculation (min) 34 min    Activity Tolerance Patient tolerated treatment well    Behavior During Therapy [facility] for tasks assessed/performed               Past Medical History:  Diagnosis Date   ADHD    Anxiety    Gallstone    Nexplanon insertion 01/01/2014   nexplanon inserted 01/01/14 left arm remove 01/01/17   Pregnancy with one fetus    Past Surgical History:  Procedure Laterality Date   CHOLECYSTECTOMY  12/03/2011   Procedure: LAPAROSCOPIC CHOLECYSTECTOMY;  Surgeon: Jamesetta So, MD;  Location: [facility];  Service: General;  Laterality: N/A;   NO PAST SURGERIES     Patient Active Problem List   Diagnosis Date Noted   Depression 09/22/2021   Urinary incontinence 09/22/2021    REFERRING DIAG: N39.498 (ICD-10-CM) - Other urinary incontinence  THERAPY DIAG:  Muscle weakness (generalized)  Other muscle spasm  Unspecified lack of coordination  PERTINENT HISTORY: cholecystectomy, 2 vaginal deliveries  PRECAUTIONS: NA  SUBJECTIVE: Pt states that she is seeing a significant improvement in Rt hip/LE pain when she is walking/sitting, but will still feel it when she pulls leg up to tie shoes or squeezes legs together. Urinary symptoms are the same.    SUBJECTIVE STATEMENT 09/25/21: Pt stats that she has urinary incontinence when she laughing/coughing/sneezing and sit<>stand. She denies pain, but states that sometimes she has sharpness in lower abdomen randomly and with intercourse. Pt also ended up reporting increased Rt anterior hip pain when  performing stretches at end of session and states that she has referral to orthopedic MD for assessment.  Fluid intake: Yes: half gallon a day       PATIENT GOALS decrease leaking   PERTINENT HISTORY:  cholecystectomy Sexual abuse: No   BOWEL MOVEMENT Pain with bowel movement: No Type of bowel movement:Frequency 2x/day and Strain No Fully empty rectum: No - sometimes Leakage: No Pads: No Fiber supplement: No   URINATION Pain with urination: No Fully empty bladder: Yes: - Stream:  WNL Urgency: No usually can hold it Frequency: every 3 hours Leakage: Coughing, Sneezing, Laughing, and Bending forward Pads: No   INTERCOURSE Pain with intercourse: During Penetration - occasionally Ability to have vaginal penetration:  Yes: - Climax: yes - she does remember a time when orgasm was painful up into lower abdomen No lubrication     PREGNANCY Vaginal deliveries 2 Tearing Yes: stitches C-section deliveries 0 Currently pregnant No         OBJECTIVE 10/20/21: Squat with large Rt weight shift; single leg stance 10 sec Bil but large increase in instability on Rt compared to Lt; Rt hip strength grossly 3/5 with flexion, IR, and abduction causing anterior Rt hip pain; Lt hip strength grossly 4+/5 with exception of abduction 3/5; trigger points throughout Rt glutes and Rt hip flexors with tenderness to palpation;  Scour/FAIR (+) on Rt  09/25/21:    COGNITION:  Overall cognitive status: Within functional limits for tasks assessed                          SENSATION:            Light touch: Appears intact            Proprioception: Appears intact     PELVIC MMT: 1/5 with cuing - initially bearing down with breath holding; endurance and repetitions not assessed due to difficulty with coordination, tension, and lack of contraction.            PALPATION:   General  no abdominal tenderness                 External Perineal Exam WNL                             Internal  Pelvic Floor tenderness throughout Rt superficial and deep pelvic floor with notable tension; some tenderness in deep Lt pelvic floor, but less notable   TONE: High - Rt>LT   PROLAPSE: None detected this treatment session    TODAY'S TREATMENT 11/19/21 Manual: Soft tissue mobilization: Rt anterior/lateral hip mm Rt Internal pelvic floor techniques: No emotional/communication barriers or cognitive limitation. Patient is motivated to learn. Patient understands and agrees with treatment goals and plan. PT explains patient will be examined in standing, sitting, and lying down to see how their muscles and joints work. When they are ready, they will be asked to remove their underwear so PT can examine their perineum. The patient is also given the option of providing their own chaperone as one is not provided in our facility. The patient also has the right and is explained the right to defer or refuse any part of the evaluation or treatment including the internal exam. With the patient's consent, PT will use one gloved finger to gently assess the muscles of the pelvic floor, seeing how well it contracts and relaxes and if there is muscle symmetry. After, the patient will get dressed and PT and patient will discuss exam findings and plan of care. PT and patient discuss plan of care, schedule, attendance policy and HEP activities. Deep Rt pelvic floor release in posterior levator ani Dry needling: Trigger Point Dry-Needling  Treatment instructions: Expect mild to moderate muscle soreness. S/S of pneumothorax if dry needled over a lung field, and to seek immediate medical attention should they occur. Patient verbalized understanding of these instructions and education.  Patient Consent Given: Yes Education handout provided: Previously provided Muscles treated: Rt TFL, iliopsoas insertion, adductors Electrical stimulation performed: No Parameters: N/A Treatment response/outcome: twitch response and  muscular release  Neuromuscular re-education: Pelvic floor contraction training: Quick flicks 2 x 10 with multimodal cues for improved motor control and breath coordination The knack quick flicks with cues for improved motor control Exercises: Stretches/mobility: Strengthening: Sidelying hip abduction 10x Rt Sidelying hip adduction 10x Rt Straight leg raise 10x Rt  TREATMENT 10/27/21: Manual: Soft tissue mobilization: Posterolateral Rt hip mm and anterior hip mm Scar tissue mobilization: Myofascial release: Instrument assisted soft tissue anterior Rt thigh Spinal mobilization: Internal pelvic floor techniques: Dry needling: Verbal consent provided from patient Rt glutes, piriformis, hip flexors Neuromuscular re-education: Core retraining:  Core facilitation: Form correction: Pelvic floor contraction training: Down training: Exercises: Stretches/mobility: Strengthening: Bridge with ball squeeze 2 x 10 Clam shell 2 x 10 Therapeutic activities: Functional strengthening activities: Self-care:  TREATMENT 10/20/21: Manual: Soft tissue mobilization: Posterolateral Rt hip mm and anterior hip mm Scar tissue mobilization: Myofascial release: Spinal mobilization: Internal pelvic floor techniques: Dry needling: Verbal consent provided from patient Rt glutes, piriformis, hip flexors Neuromuscular re-education: Core retraining:  Core facilitation: Form correction: Pelvic floor contraction training: Down training: Exercises: Stretches/mobility: Piriformis stretch 60 sec bil Hip flexor stretch 60 sec bil Strengthening: Clam shells 2 x 10 Straight leg raise 10x bil Therapeutic activities: Functional strengthening activities: Self-care:       PATIENT EDUCATION:  Education details: HEP progressions Person educated: Patient Education method: Explanation, Demonstration, Tactile cues, Verbal cues, and Handouts Education comprehension: verbalized understanding      HOME EXERCISE PROGRAM: [id number]   ASSESSMENT:   CLINICAL IMPRESSION: Patient has been doing much better with Rt hip pain with large improvement reported. She continues to have pain in Rt hip with activities of increased flexion, like putting on shoes. Due to this, continued DN and manual techniques to anterior/lateral Rt hip with significant twitch response and tension release. She reported no pain with flexion after this, but did feel residual soreness. She was able to progress hip strengthening to include sidelying hip add/abduction. Internal vaginal exam demonstrated good improvements in pain and tension in pelvic floor; she does still demonstrate restriction, Rt>Lt, levator ani in posterior aspect. Believe this is largely related to Rt hip pain. She tolerated release to these muscles and then pelvic floor contraction training well. Pelvic floor contraction currently 1/5 with heavy cuing, but she began to find increased ease with breath coordination. She was able to add these contractions and the knack to HEP. She reported 0/10 pain at end of treatment session. She will benefit from skilled PT intervention in order to address impairments, decrease pain, decrease urinary incontinence, and improve quality of life.      OBJECTIVE IMPAIRMENTS decreased activity tolerance, decreased coordination, decreased endurance, decreased mobility, decreased strength, hypomobility, increased fascial restrictions, increased muscle spasms, impaired tone, postural dysfunction, and pain.    ACTIVITY LIMITATIONS  intercourse, jumping jacks, increased abdominal pressure .    PERSONAL FACTORS 1-2 comorbidities: 2 vaginal deliveries with tearing and cholecystectomy  are also affecting patient's functional outcome.      REHAB POTENTIAL: Good   CLINICAL DECISION MAKING: Evolving/moderate complexity   EVALUATION COMPLEXITY: Moderate     GOALS: Goals reviewed with patient? Yes   SHORT TERM GOALS: Target date:  10/23/2021 - updated 11/19/21   Pt will be independent with HEP.    Baseline: Goal status: MET 11/19/21   2.  Pt will be independent with the knack, urge suppression technique, and double voiding in order to improve bladder habits and decrease urinary incontinence.    Baseline:  Goal status: IN PROGRESS       LONG TERM GOALS: Target date: 12/18/2021 - updated 11/19/21   Pt will be independent with advanced HEP.    Baseline:  Goal status: IN PROGRESS   2.  Pt will restore normal Rt hip A/ROM to improve pelvic mobility and decrease pain with activity.  Baseline:  Goal status: IN PROGRESS   3.  Pt will demonstrate increase in all impaired hip strength by 1 muscle grades in order to demonstrate improved lumbopelvic support and increase functional ability.    Baseline:  Goal status: IN PROGRESS   4.  Pt will demonstrate normal pelvic floor muscle tone and A/ROM, able to achieve 3/5 strength with contractions and 10 sec endurance, in order to provide appropriate lumbopelvic support in functional  activities.    Baseline: Currently 1/5 strength with motor control difficulty Goal status: IN PROGRESS   5.  Pt will report no leaks with laughing, coughing, sneezing in order to improve comfort with interpersonal relationships and community activities.    Baseline:  Goal status: IN PROGRESS   6.  Pt will report no increase in lower abdominal/Rt hip pain with intercourse, jumping jacks, playing with kids, or any functional activity in order to improve QOL.  Baseline:  Goal status: IN PROGRESS   PLAN: PT FREQUENCY: 1x/week   PT DURATION: 12 weeks   PLANNED INTERVENTIONS: Therapeutic exercises, Therapeutic activity, Neuromuscular re-education, Balance training, Gait training, Patient/Family education, Joint mobilization, Dry Needling, Biofeedback, and Manual therapy   PLAN FOR NEXT SESSION: Continue DN/manual techniques to Rt hip as needed; plan to perform internal pelvic floor release;  progress mobility/strengthening - begin core training/progressions.   Heather Roberts, PT, DPT06/15/234:24 PM

## 2021-11-23 ENCOUNTER — Ambulatory Visit: Payer: BC Managed Care – PPO

## 2021-11-24 ENCOUNTER — Ambulatory Visit: Payer: BC Managed Care – PPO

## 2021-11-24 DIAGNOSIS — M6281 Muscle weakness (generalized): Secondary | ICD-10-CM

## 2021-11-24 DIAGNOSIS — R279 Unspecified lack of coordination: Secondary | ICD-10-CM

## 2021-11-24 DIAGNOSIS — M62838 Other muscle spasm: Secondary | ICD-10-CM

## 2021-11-24 NOTE — Therapy (Addendum)
OUTPATIENT PHYSICAL THERAPY TREATMENT NOTE   Patient Name: Linda Pollard MRN: [id number] DOB:May 17, 1991, 31 y.o., female Today's Date: 11/24/2021  PCP: [facility] REFERRING PROVIDER: Myrtis Ser, CNM  END OF SESSION:       Past Medical History:  Diagnosis Date   ADHD    Anxiety    Gallstone    Nexplanon insertion 01/01/2014   nexplanon inserted 01/01/14 left arm remove 01/01/17   Pregnancy with one fetus    Past Surgical History:  Procedure Laterality Date   CHOLECYSTECTOMY  12/03/2011   Procedure: LAPAROSCOPIC CHOLECYSTECTOMY;  Surgeon: Jamesetta So, MD;  Location: [facility];  Service: General;  Laterality: N/A;   NO PAST SURGERIES     Patient Active Problem List   Diagnosis Date Noted   Depression 09/22/2021   Urinary incontinence 09/22/2021    REFERRING DIAG: N39.498 (ICD-10-CM) - Other urinary incontinence  THERAPY DIAG:  No diagnosis found.  PERTINENT HISTORY: cholecystectomy, 2 vaginal deliveries  PRECAUTIONS: NA  SUBJECTIVE: Patient states that her Rt hip has been worse since last treatment session and pain woke her up last night when she was sleeping. She is currently in 2/10 pain. Movement helps reduce the pain and not moving is worse. She states that she felt like she had to limp for a couple of days. She has been practicing pelvic floor contractions regularly, but no changes in urinary incontinence.    SUBJECTIVE STATEMENT 09/25/21: Pt stats that she has urinary incontinence when she laughing/coughing/sneezing and sit<>stand. She denies pain, but states that sometimes she has sharpness in lower abdomen randomly and with intercourse. Pt also ended up reporting increased Rt anterior hip pain when performing stretches at end of session and states that she has referral to orthopedic MD for assessment.  Fluid intake: Yes: half gallon a day       PATIENT GOALS decrease leaking   PERTINENT HISTORY:  cholecystectomy Sexual abuse:  No   BOWEL MOVEMENT Pain with bowel movement: No Type of bowel movement:Frequency 2x/day and Strain No Fully empty rectum: No - sometimes Leakage: No Pads: No Fiber supplement: No   URINATION Pain with urination: No Fully empty bladder: Yes: - Stream:  WNL Urgency: No usually can hold it Frequency: every 3 hours Leakage: Coughing, Sneezing, Laughing, and Bending forward Pads: No   INTERCOURSE Pain with intercourse: During Penetration - occasionally Ability to have vaginal penetration:  Yes: - Climax: yes - she does remember a time when orgasm was painful up into lower abdomen No lubrication     PREGNANCY Vaginal deliveries 2 Tearing Yes: stitches C-section deliveries 0 Currently pregnant No         OBJECTIVE 10/20/21: Squat with large Rt weight shift; single leg stance 10 sec Bil but large increase in instability on Rt compared to Lt; Rt hip strength grossly 3/5 with flexion, IR, and abduction causing anterior Rt hip pain; Lt hip strength grossly 4+/5 with exception of abduction 3/5; trigger points throughout Rt glutes and Rt hip flexors with tenderness to palpation;  Scour/FAIR (+) on Rt  09/25/21:    COGNITION:            Overall cognitive status: Within functional limits for tasks assessed                          SENSATION:            Light touch: Appears intact  Proprioception: Appears intact     PELVIC MMT: 1/5 with cuing - initially bearing down with breath holding; endurance and repetitions not assessed due to difficulty with coordination, tension, and lack of contraction.            PALPATION:   General  no abdominal tenderness                 External Perineal Exam WNL                             Internal Pelvic Floor tenderness throughout Rt superficial and deep pelvic floor with notable tension; some tenderness in deep Lt pelvic floor, but less notable   TONE: High - Rt>LT   PROLAPSE: None detected this treatment session    TODAY'S  TREATMENT 11/24/21 Manual: Mobilization: Lateral Rt hip mobs with strap in hooklying 5 min Long axis traction of Rt LE 5 min Prone Rt hip PA mobilizations Gr 1-3 Neuromuscular re-education: Core retraining:  Transversus abdominus training with multimodal cues for improved motor control and breath coordination Core facilitation: Supine march 2 x 10 Supine leg extensions 10x bil  Exercises: Stretches/mobility: Cat cow 2 x 10 Bent knee fall outs 10x bil Lower trunk rotation with feet on red ball 2 x 10 Strengthening: Self-care: The knack   TREATMENT 11/19/21 Manual: Soft tissue mobilization: Rt anterior/lateral hip mm Rt Internal pelvic floor techniques: No emotional/communication barriers or cognitive limitation. Patient is motivated to learn. Patient understands and agrees with treatment goals and plan. PT explains patient will be examined in standing, sitting, and lying down to see how their muscles and joints work. When they are ready, they will be asked to remove their underwear so PT can examine their perineum. The patient is also given the option of providing their own chaperone as one is not provided in our facility. The patient also has the right and is explained the right to defer or refuse any part of the evaluation or treatment including the internal exam. With the patient's consent, PT will use one gloved finger to gently assess the muscles of the pelvic floor, seeing how well it contracts and relaxes and if there is muscle symmetry. After, the patient will get dressed and PT and patient will discuss exam findings and plan of care. PT and patient discuss plan of care, schedule, attendance policy and HEP activities. Deep Rt pelvic floor release in posterior levator ani Dry needling: Trigger Point Dry-Needling  Treatment instructions: Expect mild to moderate muscle soreness. S/S of pneumothorax if dry needled over a lung field, and to seek immediate medical attention should they  occur. Patient verbalized understanding of these instructions and education.  Patient Consent Given: Yes Education handout provided: Previously provided Muscles treated: Rt TFL, iliopsoas insertion, adductors Electrical stimulation performed: No Parameters: N/A Treatment response/outcome: twitch response and muscular release  Neuromuscular re-education: Pelvic floor contraction training: Quick flicks 2 x 10 with multimodal cues for improved motor control and breath coordination The knack quick flicks with cues for improved motor control Exercises: Strengthening: Sidelying hip abduction 10x Rt Sidelying hip adduction 10x Rt Straight leg raise 10x Rt  TREATMENT 10/27/21: Manual: Soft tissue mobilization: Posterolateral Rt hip mm and anterior hip mm Scar tissue mobilization: Myofascial release: Instrument assisted soft tissue anterior Rt thigh Spinal mobilization: Internal pelvic floor techniques: Dry needling: Verbal consent provided from patient Rt glutes, piriformis, hip flexors Neuromuscular re-education: Core retraining:  Core facilitation: Form correction:  Pelvic floor contraction training: Down training: Exercises: Stretches/mobility: Strengthening: Bridge with ball squeeze 2 x 10 Clam shell 2 x 10 Therapeutic activities: Functional strengthening activities: Self-care:        PATIENT EDUCATION:  Education details: HEP progressions Person educated: Patient Education method: Consulting civil engineer, Demonstration, Tactile cues, Verbal cues, and Handouts Education comprehension: verbalized understanding     HOME EXERCISE PROGRAM: [id number]   ASSESSMENT:   CLINICAL IMPRESSION: Patient had difficult week with increase in Rt hip pain after DN last session. Exercises were helpful at decreasing pain. Different manual techniques including long axis traction, lateral hip mobilizations, and PA hip mobilizations performed today with good improvement in pt-reported pain - she  reported 0/10 pain at end of session. She was educated on long axis traction as a good option to perform at home with husband with increase in pain. Due to nature of symptoms, believe that there is high likelihood of intraarticular issue of Rt hip. However, we discussed that conservative treatment can be very helpful in managing any number of conditions; if we don't see her pain consistently come down, we will refer to orthopedic MD for further evaluation. She was encouraged to incorporate abdominal training when she feels discomfort in Rt hip. She will benefit from skilled PT intervention in order to address impairments, decrease pain, decrease urinary incontinence, and improve quality of life.      OBJECTIVE IMPAIRMENTS decreased activity tolerance, decreased coordination, decreased endurance, decreased mobility, decreased strength, hypomobility, increased fascial restrictions, increased muscle spasms, impaired tone, postural dysfunction, and pain.    ACTIVITY LIMITATIONS  intercourse, jumping jacks, increased abdominal pressure .    PERSONAL FACTORS 1-2 comorbidities: 2 vaginal deliveries with tearing and cholecystectomy  are also affecting patient's functional outcome.      REHAB POTENTIAL: Good   CLINICAL DECISION MAKING: Evolving/moderate complexity   EVALUATION COMPLEXITY: Moderate     GOALS: Goals reviewed with patient? Yes   SHORT TERM GOALS: Target date: 10/23/2021 - updated 11/19/21   Pt will be independent with HEP.    Baseline: Goal status: MET 11/19/21   2.  Pt will be independent with the knack, urge suppression technique, and double voiding in order to improve bladder habits and decrease urinary incontinence.    Baseline:  Goal status: IN PROGRESS       LONG TERM GOALS: Target date: 12/18/2021 - updated 11/19/21   Pt will be independent with advanced HEP.    Baseline:  Goal status: IN PROGRESS   2.  Pt will restore normal Rt hip A/ROM to improve pelvic mobility and  decrease pain with activity.  Baseline:  Goal status: IN PROGRESS   3.  Pt will demonstrate increase in all impaired hip strength by 1 muscle grades in order to demonstrate improved lumbopelvic support and increase functional ability.    Baseline:  Goal status: IN PROGRESS   4.  Pt will demonstrate normal pelvic floor muscle tone and A/ROM, able to achieve 3/5 strength with contractions and 10 sec endurance, in order to provide appropriate lumbopelvic support in functional activities.    Baseline: Currently 1/5 strength with motor control difficulty Goal status: IN PROGRESS   5.  Pt will report no leaks with laughing, coughing, sneezing in order to improve comfort with interpersonal relationships and community activities.    Baseline:  Goal status: IN PROGRESS   6.  Pt will report no increase in lower abdominal/Rt hip pain with intercourse, jumping jacks, playing with kids, or any functional activity in order to  improve QOL.  Baseline:  Goal status: IN PROGRESS   PLAN: PT FREQUENCY: 1x/week   PT DURATION: 12 weeks   PLANNED INTERVENTIONS: Therapeutic exercises, Therapeutic activity, Neuromuscular re-education, Balance training, Gait training, Patient/Family education, Joint mobilization, Dry Needling, Biofeedback, and Manual therapy   PLAN FOR NEXT SESSION: Continue DN/manual techniques to Rt hip as needed; plan to perform internal pelvic floor release; progress mobility/strengthening - progress core exercises with pelvic floor incorporation.    Heather Roberts, PT, DPT06/20/238:04 AM  PHYSICAL THERAPY DISCHARGE SUMMARY  Visits from Start of Care: 5  Current functional level related to goals / functional outcomes: Incomplete   Remaining deficits: See above   Education / Equipment: HEP   Patient agrees to discharge. Patient goals were partially met. Patient is being discharged due to not returning since the last visit.  Heather Roberts, PT, DPT07/26/2312:39 PM

## 2021-12-01 ENCOUNTER — Ambulatory Visit: Payer: BC Managed Care – PPO

## 2021-12-01 ENCOUNTER — Telehealth: Payer: Self-pay

## 2021-12-09 ENCOUNTER — Ambulatory Visit: Payer: BC Managed Care – PPO

## 2021-12-16 ENCOUNTER — Ambulatory Visit: Payer: BC Managed Care – PPO | Attending: Advanced Practice Midwife

## 2021-12-16 DIAGNOSIS — M62838 Other muscle spasm: Secondary | ICD-10-CM | POA: Insufficient documentation

## 2021-12-16 DIAGNOSIS — R279 Unspecified lack of coordination: Secondary | ICD-10-CM | POA: Insufficient documentation

## 2021-12-16 DIAGNOSIS — M6281 Muscle weakness (generalized): Secondary | ICD-10-CM | POA: Insufficient documentation

## 2021-12-23 ENCOUNTER — Telehealth: Payer: Self-pay

## 2021-12-23 ENCOUNTER — Ambulatory Visit: Payer: BC Managed Care – PPO

## 2021-12-23 NOTE — Telephone Encounter (Signed)
Called and spoke with patient after no-show today at 9:30 12/23/21. Pt states she was not aware of appointment and did not get reminder. Will plan to see her next week at appointment on 12/30/21 at 8:00.

## 2021-12-30 ENCOUNTER — Telehealth: Payer: Self-pay

## 2021-12-30 ENCOUNTER — Ambulatory Visit: Payer: BC Managed Care – PPO

## 2021-12-30 NOTE — Telephone Encounter (Signed)
Patient called 25 minutes into missed appointment today to state that she was sick. Due to poor attendance and multiple no-show appointments, she will be discharged at this time. We discussed touching base this fall and we will determine if she needs further treatment. She was encouraged to reach out to orthopedic MD about ongoing Rt hip pain.

## 2022-01-13 ENCOUNTER — Emergency Department (HOSPITAL_COMMUNITY)
Admission: EM | Admit: 2022-01-13 | Discharge: 2022-01-13 | Disposition: A | Discharge status: Home / Self Care | Payer: BC Managed Care – PPO | Attending: Emergency Medicine | Admitting: Emergency Medicine

## 2022-01-13 ENCOUNTER — Emergency Department (HOSPITAL_COMMUNITY): Payer: BC Managed Care – PPO

## 2022-01-13 ENCOUNTER — Other Ambulatory Visit: Payer: Self-pay

## 2022-01-13 ENCOUNTER — Encounter (HOSPITAL_COMMUNITY): Payer: Self-pay | Admitting: Emergency Medicine

## 2022-01-13 DIAGNOSIS — R519 Headache, unspecified: Secondary | ICD-10-CM | POA: Insufficient documentation

## 2022-01-13 MED ORDER — DEXAMETHASONE SODIUM PHOSPHATE 10 MG/ML IJ SOLN
10.0000 mg | Freq: Once | INTRAMUSCULAR | Status: AC
  Administered 2022-01-13: 10 mg via INTRAMUSCULAR
  Filled 2022-01-13: qty 1

## 2022-01-13 MED ORDER — IBUPROFEN 600 MG PO TABS: 600.0000 mg | ORAL_TABLET | Freq: Three times a day (TID) | ORAL | 0 refills | Status: DC | PRN

## 2022-01-13 MED ORDER — IBUPROFEN 400 MG PO TABS
600.0000 mg | ORAL_TABLET | Freq: Once | ORAL | Status: AC
  Administered 2022-01-13: 600 mg via ORAL
  Filled 2022-01-13: qty 2

## 2022-01-13 MED ORDER — METOCLOPRAMIDE HCL 10 MG PO TABS
10.0000 mg | ORAL_TABLET | Freq: Once | ORAL | Status: AC
  Administered 2022-01-13: 10 mg via ORAL
  Filled 2022-01-13: qty 1

## 2022-01-13 MED ORDER — METOCLOPRAMIDE HCL 10 MG PO TABS: 10.0000 mg | ORAL_TABLET | Freq: Every evening | ORAL | 0 refills | Status: DC | PRN

## 2022-01-13 MED ORDER — DIPHENHYDRAMINE HCL 25 MG PO TABS: 25.0000 mg | ORAL_TABLET | Freq: Every evening | ORAL | 0 refills | Status: DC | PRN

## 2022-01-13 NOTE — ED Triage Notes (Signed)
Pt to the ED with a complaint of intermittent headaches for the past week.   Pt was seen by her PCP and she thinks she prescribed prednisone 20 mg without relief.

## 2022-01-13 NOTE — ED Notes (Signed)
ED Provider at bedside. 

## 2022-01-13 NOTE — Discharge Instructions (Addendum)
You will need to follow-up as an outpatient for your headaches.  You were given a shot of steroids in the muscle today to help with your pain.  You are also given medicines Reglan and ibuprofen which can help with headache.  You can continue taking ibuprofen and Tylenol in the daytime for your headache pain.  At night you can combine your evening ibuprofen dose with Reglan and Benadryl before bedtime.  This can help you sleep at night can also help with headaches at night.  These medicines will make you sleepy.  If your headache significantly worsens, if you have loss of vision or change in vision, numbness or weakness in your arms or legs, persistent vomiting, or any other acute emergency concerns, please call 911 or return to the ER immediately.

## 2022-01-13 NOTE — ED Provider Notes (Signed)
[facility] EMERGENCY DEPARTMENT Provider Note   CSN: [id number] Arrival date & time: 01/13/22  1014     History  Chief Complaint  Patient presents with   Headache    Linda Pollard is a 31 y.o. female presenting to the ER with headache.  She reports gradual onset of right-sided temporal pain about 2 weeks ago.  The headache has been persistent since then, waxing and waning in intensity but never disappearing.  It is worse with bending over and coughing.  Nothing is made it better, although she reports she has only tried Tylenol, as well as a very short course of prednisone, 40 mg, prescribed her PCP, which she took for 2 days and then stopped.  She describes the headache sensation as both throbbing and "like I am really high".  She does suffer from headaches and migraines which are generally located in the same right temporal lobe region, but this feels different and is lasting longer than her typical headaches.  She has not seen a neurologist.  She denies history of brain injury or trauma.  Denies family history of aneurysm.  She denies fevers, upper respiratory infection, cough, neck stiffness, photophobia.  She denies any visual changes or blurred vision.  HPI     Home Medications Prior to Admission medications   Medication Sig Start Date End Date Taking? Authorizing Provider  diphenhydrAMINE (BENADRYL) 25 MG tablet Take 1 tablet (25 mg total) by mouth at bedtime as needed for up to 15 doses for sleep. 01/13/22  Yes Bethel Sirois, Kermit Balo, MD  ibuprofen (ADVIL) 600 MG tablet Take 1 tablet (600 mg total) by mouth every 8 (eight) hours as needed for up to 30 doses for mild pain or moderate pain. 01/13/22  Yes Jabriel Vanduyne, Kermit Balo, MD  metoCLOPramide (REGLAN) 10 MG tablet Take 1 tablet (10 mg total) by mouth at bedtime as needed for up to 10 doses for nausea. 01/13/22  Yes Terald Sleeper, MD  amphetamine-dextroamphetamine (ADDERALL) 5 MG tablet Take 1 tablet by mouth daily. 08/31/21   [provider]  ciclopirox (PENLAC) 8 % solution Apply topically at bedtime. 07/21/21   [provider]  hydrOXYzine (ATARAX) 25 MG tablet Take 25 mg by mouth every 8 (eight) hours as needed. 03/31/21   [provider]  Vitamin D, Ergocalciferol, (DRISDOL) 1.25 MG (50000 UNIT) CAPS capsule Take 50,000 Units by mouth once a week. 08/31/21   [provider]  fluticasone (FLONASE) 50 MCG/ACT nasal spray Place 2 sprays into both nostrils daily. 08/09/17 06/25/19  Michela Pitcher A, PA-C  sucralfate (CARAFATE) 1 GM/10ML suspension Take 10 mLs (1 g total) by mouth 4 (four) times daily -  with meals and at bedtime. 05/27/17 06/25/19  Gerhard Munch, MD      Allergies    Patient has no known allergies.    Review of Systems   Review of Systems  Physical Exam Updated Vital Signs BP 133/82 (BP Location: Right Arm)   Pulse 95   Temp 97.9 F (36.6 C) (Oral)   Resp 18   Ht 4\' 11"  (1.499 m)   Wt 83.5 kg   SpO2 97%   BMI 37.18 kg/m  Physical Exam Constitutional:      General: She is not in acute distress. HENT:     Head: Normocephalic and atraumatic.  Eyes:     Extraocular Movements:     Right eye: Normal extraocular motion and no nystagmus.     Left eye: No nystagmus.  Conjunctiva/sclera: Conjunctivae normal.     Pupils: Pupils are equal, round, and reactive to light.  Cardiovascular:     Rate and Rhythm: Normal rate and regular rhythm.  Pulmonary:     Effort: Pulmonary effort is normal. No respiratory distress.  Abdominal:     General: There is no distension.     Tenderness: There is no abdominal tenderness.  Skin:    General: Skin is warm and dry.  Neurological:     General: No focal deficit present.     Mental Status: She is alert and oriented to person, place, and time. Mental status is at baseline.     Comments: No photophobia, no nuchal rigidity  Psychiatric:        Mood and Affect: Mood normal.        Behavior: Behavior normal.     ED Results /  Procedures / Treatments   Labs (all labs ordered are listed, but only abnormal results are displayed) Labs Reviewed - No data to display  EKG None  Radiology CT Head Wo Contrast  Result Date: 01/13/2022 CLINICAL DATA:  Headache, chronic, with new features or increased frequency. EXAM: CT HEAD WITHOUT CONTRAST TECHNIQUE: Contiguous axial images were obtained from the base of the skull through the vertex without intravenous contrast. RADIATION DOSE REDUCTION: This exam was performed according to the departmental dose-optimization program which includes automated exposure control, adjustment of the mA and/or kV according to patient size and/or use of iterative reconstruction technique. COMPARISON:  None FINDINGS: Brain: The brain shows a normal appearance without evidence of malformation, atrophy, old or acute small or large vessel infarction, mass lesion, hemorrhage, hydrocephalus or extra-axial collection. Vascular: No hyperdense vessel. No evidence of atherosclerotic calcification. Skull: Normal.  No traumatic finding.  No focal bone lesion. Sinuses/Orbits: Sinuses are clear. Orbits appear normal. Mastoids are clear. Other: None significant IMPRESSION: Normal head CT Electronically Signed   By: Paulina Fusi M.D.   On: 01/13/2022 11:45    Procedures Procedures    Medications Ordered in ED Medications  dexamethasone (DECADRON) injection 10 mg (has no administration in time range)  metoCLOPramide (REGLAN) tablet 10 mg (10 mg Oral Given 01/13/22 1131)  ibuprofen (ADVIL) tablet 600 mg (600 mg Oral Given 01/13/22 1131)    ED Course/ Medical Decision Making/ A&P Clinical Course as of 01/13/22 1243  Wed Jan 13, 2022  1238 Patient's headache is improved but not gone altogether after the medication.  She will need referral to neurology at this point for this abnormal persistent headache.  She is amenable to trying a dose of intramuscular steroid or Decadron, which may provide her some additional relief,  as I feel she was on a fairly low-dose of prednisone and only for 2 days as an outpatient.  We can also prescribe ibuprofen, Reglan and Benadryl, which may be a more effective cocktail to help her sleep at night. [MT]    Clinical Course User Index [MT] Jemaine Prokop, Kermit Balo, MD                           Medical Decision Making Amount and/or Complexity of Data Reviewed Radiology: ordered.  Risk OTC drugs. Prescription drug management.   This patient presents to the Emergency Department with complaint of headache.  This involves an extensive number of treatment options, and is a complaint that carries with it a high risk of complications and morbidity.  The differential diagnosis for headache includes tension type headache  vs occipital headache vs migraine vs sinusitis vs intracranial bleed vs other  I ordered, reviewed, and interpreted labs, including BMP and CBC.  There were no immediate, life-threatening emergencies found in this labwork.   I ordered medication motrin, reglan for headache and/or nausea I ordered imaging studies which included CT head  I independently visualized and interpreted imaging which showed no acute abnormalities  After the interventions stated above, I reevaluated the patient and found that the patient remained clinically stable.  Based on the patient's clinical exam, vital signs, risk factors, and ED testing, I felt that the patient's overall risk of life-threatening emergency such as ICH, meningitis, intracranial mass or tumor was quite low.  I suspect this clinical presentation is most consistent with nonspecific headache, but explained to the patient that this evaluation was not a definitive diagnostic workup.  I discussed outpatient follow up with primary care provider, and provided specialist office number on the patient's discharge paper if a referral was deemed necessary.  I discussed return precautions with the patient. I felt the patient was clinically stable  for discharge.         Final Clinical Impression(s) / ED Diagnoses Final diagnoses:  Nonintractable headache, unspecified chronicity pattern, unspecified headache type    Rx / DC Orders ED Discharge Orders          Ordered    Ambulatory referral to Neurology       Comments: An appointment is requested in approximately: 1 week Persistent headaches   01/13/22 1240    metoCLOPramide (REGLAN) 10 MG tablet  At bedtime PRN        01/13/22 1241    diphenhydrAMINE (BENADRYL) 25 MG tablet  At bedtime PRN        01/13/22 1241    ibuprofen (ADVIL) 600 MG tablet  Every 8 hours PRN        01/13/22 1241              Terald Sleeper, MD 01/13/22 1243

## 2022-01-13 NOTE — ED Notes (Signed)
Patient transported to CT 

## 2022-04-27 ENCOUNTER — Encounter: Payer: Self-pay | Admitting: Radiology

## 2022-04-28 ENCOUNTER — Ambulatory Visit (INDEPENDENT_AMBULATORY_CARE_PROVIDER_SITE_OTHER): Payer: BC Managed Care – PPO

## 2022-04-28 ENCOUNTER — Ambulatory Visit (INDEPENDENT_AMBULATORY_CARE_PROVIDER_SITE_OTHER): Payer: BC Managed Care – PPO | Admitting: Orthopedic Surgery

## 2022-04-28 ENCOUNTER — Encounter: Payer: Self-pay | Admitting: Orthopedic Surgery

## 2022-04-28 VITALS — Ht 59.0 in | Wt 180.0 lb

## 2022-04-28 DIAGNOSIS — M25551 Pain in right hip: Secondary | ICD-10-CM | POA: Diagnosis not present

## 2022-04-28 DIAGNOSIS — M79604 Pain in right leg: Secondary | ICD-10-CM

## 2022-04-28 NOTE — Progress Notes (Signed)
New Patient Visit  Assessment: Linda Pollard is a 31 y.o. female with the following: Right hip pain; concern for intra-articular pathology including labral tear.   Plan: Linda Pollard has pain in her right hip.  Initial onset was following the birth of her first child.  Symptoms worsened after the second child.  This is been ongoing for several years.  She has pain in her groin, which is exacerbated with manipulation of her hip.  She has pain in the impingement position.  Physical therapy has not been helpful.  Medications not helpful.  On radiographs, there is some concern for hip dysplasia.  At this point, I am recommending an MRI, with intra-articular contrast.  Once the results are available, I will see her in clinic for repeat evaluation.  Follow-up: Return for After MRI.  Subjective:  Chief Complaint  Patient presents with   Leg Pain    Rt hip/leg pain since first pregnancy. Radiates down leg since July    Hip Pain    History of Present Illness: Linda Pollard is a 31 y.o. female who presents for evaluation of right hip pain.  She has had pain in her right hip for several years.  Pain for started in 2013 after the birth of her first child.  It resolved.  Then she had her second child in 2015, and since then, she has had pain in the right hip.  She isolates the pain in her right groin.  She has recently completed some physical therapy for pelvic floor exercises.  This has not improved her pain.  Pain is starting to radiate distally.  Medications have not been helpful.  No prior injections.   Review of Systems: No fevers or chills No numbness or tingling No chest pain No shortness of breath No bowel or bladder dysfunction No GI distress No headaches   Medical History:  Past Medical History:  Diagnosis Date   ADHD    Anxiety    Gallstone    Nexplanon insertion 01/01/2014   nexplanon inserted 01/01/14 left arm remove 01/01/17   Pregnancy with one fetus     Past  Surgical History:  Procedure Laterality Date   CHOLECYSTECTOMY  12/03/2011   Procedure: LAPAROSCOPIC CHOLECYSTECTOMY;  Surgeon: Dalia Heading, MD;  Location: [facility];  Service: General;  Laterality: N/A;   NO PAST SURGERIES      Family History  Problem Relation Age of Onset   Healthy Father    Healthy Mother    Anesthesia problems Neg Hx    Social History   Tobacco Use   Smoking status: Never   Smokeless tobacco: Never  Vaping Use   Vaping Use: Never used  Substance Use Topics   Alcohol use: No   Drug use: No    No Known Allergies  No outpatient medications have been marked as taking for the 04/28/22 encounter (Office Visit) with Oliver Barre, MD.    Objective: Ht 4\' 11"  (1.499 m)   Wt 180 lb (81.6 kg)   BMI 36.36 kg/m   Physical Exam:  General: Alert and oriented. and No acute distress. Gait: Right sided antalgic gait.  Evaluation of right hip demonstrates no deformity.  No swelling is appreciated.  She starts to have pain in the right hip with flexion to 90 degrees.  In this position, it is painful within the groin with internal and external rotation.  Pain with hip flexion and adduction.  Pain in the impingement position.  Good strength in right lower  lower extremity otherwise.  Sensation is intact distally.  IMAGING: I personally ordered and reviewed the following images  Standing lumbar spine x-rays were obtained in clinic today.  No acute injuries are noted.  Well-maintained disc height.  Minimal degenerative changes.  No anterolisthesis.  Impression: Negative lumbar spine x-ray  AP pelvis was obtained in clinic today.  No acute injuries are noted.  There is rotation of the image, comparing the left hip to the right hip.  Based on the image available, there appears to be some dysplasia of the right acetabulum, with some femoral head uncovering.  Possible cam lesion.  No arthritis is appreciated.  Impression: AP pelvis demonstrating mild dysplasia of the  right hip  New Medications:  No orders of the defined types were placed in this encounter.     Linda Rasmussen, MD  04/30/2022 12:30 PM

## 2022-04-30 ENCOUNTER — Encounter: Payer: Self-pay | Admitting: Orthopedic Surgery

## 2022-05-23 ENCOUNTER — Ambulatory Visit
Admission: RE | Admit: 2022-05-23 | Discharge: 2022-05-23 | Disposition: A | Discharge status: Home / Self Care | Payer: BC Managed Care – PPO | Source: Ambulatory Visit | Attending: Orthopedic Surgery | Admitting: Orthopedic Surgery

## 2022-05-23 DIAGNOSIS — M79604 Pain in right leg: Secondary | ICD-10-CM

## 2022-05-23 DIAGNOSIS — M25551 Pain in right hip: Secondary | ICD-10-CM

## 2022-05-23 MED ORDER — GADOPICLENOL 0.5 MMOL/ML IV SOLN
8.0000 mL | Freq: Once | INTRAVENOUS | Status: AC | PRN
  Administered 2022-05-23: 8 mL via INTRAVENOUS

## 2022-06-16 ENCOUNTER — Ambulatory Visit (INDEPENDENT_AMBULATORY_CARE_PROVIDER_SITE_OTHER): Payer: BC Managed Care – PPO | Admitting: Orthopedic Surgery

## 2022-06-16 ENCOUNTER — Encounter: Payer: Self-pay | Admitting: Orthopedic Surgery

## 2022-06-16 VITALS — Ht 59.0 in | Wt 180.0 lb

## 2022-06-16 DIAGNOSIS — M25551 Pain in right hip: Secondary | ICD-10-CM

## 2022-06-18 ENCOUNTER — Encounter: Payer: Self-pay | Admitting: Orthopedic Surgery

## 2022-06-18 NOTE — Progress Notes (Signed)
Return patient Visit  Assessment: Linda Pollard is a 32 y.o. female with the following: Right hip pain; labral tear and dysplasia.   Plan: [name] continues to have pain in her right hip.  It is unchanged.  MRI demonstrates dysplasia, degenerative changes, as well as the degenerative labral tearing.  Discussed these findings with her.  Presented her with multiple options for follow-up.  She would like to see Dr. Sammuel Hines in [place] to discuss potential treatment options.  She is aware that surgical treatment could be a combination of osteotomy and arthroscopy.  Treatment may also be limited to arthroscopy.  She will continue with her current medications.  We have placed a referral to see Dr. Sammuel Hines  Follow-up: Return if symptoms worsen or fail to improve.  Subjective:  Chief Complaint  Patient presents with   Hip Pain    Rt hip MRI results.     History of Present Illness: Linda Pollard is a 32 y.o. female who returns for evaluation of right hip pain.  She continues to have pain in the anterior hip, as well as the groin.  Her current pain is about the same.  Nothing is changed.  She has obtained the MRI of her right hip, and is here to discuss the results.  Review of Systems: No fevers or chills No numbness or tingling No chest pain No shortness of breath No bowel or bladder dysfunction No GI distress No headaches   Objective: Ht 4\' 11"  (1.499 m)   Wt 180 lb (81.6 kg)   BMI 36.36 kg/m   Physical Exam:  General: Alert and oriented. and No acute distress. Gait: Right sided antalgic gait.  Evaluation of right hip demonstrates no deformity.  No swelling is appreciated.  She starts to have pain in the right hip with flexion to 90 degrees.  In this position, it is painful within the groin with internal and external rotation.  Pain with hip flexion and adduction.  Pain in the impingement position.  Good strength in right lower lower extremity otherwise.   Sensation is intact distally.  IMAGING: I personally ordered and reviewed the following images  Right hip MRI   IMPRESSION: 1. 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 2. 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 3. No findings of dysplasia involving the left hip.   New Medications:  No orders of the defined types were placed in this encounter.     Mordecai Rasmussen, MD  06/18/2022 10:51 AM

## 2022-06-23 ENCOUNTER — Telehealth (HOSPITAL_BASED_OUTPATIENT_CLINIC_OR_DEPARTMENT_OTHER): Payer: Self-pay | Admitting: Orthopaedic Surgery

## 2022-06-23 ENCOUNTER — Ambulatory Visit (INDEPENDENT_AMBULATORY_CARE_PROVIDER_SITE_OTHER): Payer: BC Managed Care – PPO | Admitting: Orthopaedic Surgery

## 2022-06-23 DIAGNOSIS — M25551 Pain in right hip: Secondary | ICD-10-CM

## 2022-06-23 MED ORDER — LIDOCAINE HCL 1 % IJ SOLN
4.0000 mL | INTRAMUSCULAR | Status: AC | PRN
  Administered 2022-06-23: 4 mL

## 2022-06-23 MED ORDER — TRIAMCINOLONE ACETONIDE 40 MG/ML IJ SUSP
80.0000 mg | INTRAMUSCULAR | Status: AC | PRN
  Administered 2022-06-23: 80 mg via INTRA_ARTICULAR

## 2022-06-23 NOTE — Telephone Encounter (Signed)
Follow up notes with Dr. Sammuel Hines

## 2022-06-23 NOTE — Progress Notes (Signed)
Chief Complaint: Right hip pain     History of Present Illness:    Linda Pollard is a 32 y.o. female presents today with ongoing right hip pain and groin pain which has been persistent for the last several years.  She states that this was worsened by the birth of her second child in 36.  She has seen a chiropractor as well as as a pelvic floor therapist we have worked on dry needling and acupuncture of the right hip.  She takes ibuprofen for the pain.  She experiences the pain deep in the groin.  She has not previously had any injections in the right hip.  She denies any childhood needing of a brace for the right hip.    Surgical History:   None  PMH/PSH/Family History/Social History/Meds/Allergies:    Past Medical History:  Diagnosis Date  . ADHD   . Anxiety   . Gallstone   . Nexplanon insertion 01/01/2014   nexplanon inserted 01/01/14 left arm remove 01/01/17  . Pregnancy with one fetus    Past Surgical History:  Procedure Laterality Date  . CHOLECYSTECTOMY  12/03/2011   Procedure: LAPAROSCOPIC CHOLECYSTECTOMY;  Surgeon: Jamesetta So, MD;  Location: [facility];  Service: General;  Laterality: N/A;  . NO PAST SURGERIES     Social History   Socioeconomic History  . Marital status: Married    Spouse name: Not on file  . Number of children: Not on file  . Years of education: Not on file  . Highest education level: Not on file  Occupational History  . Not on file  Tobacco Use  . Smoking status: Never  . Smokeless tobacco: Never  Vaping Use  . Vaping Use: Never used  Substance and Sexual Activity  . Alcohol use: No  . Drug use: No  . Sexual activity: Yes    Birth control/protection: Condom  Other Topics Concern  . Not on file  Social History Narrative  . Not on file   Social Determinants of Health   Financial Resource Strain: Low Risk  (09/22/2021)   Overall Financial Resource Strain (CARDIA)   . Difficulty of Paying Living  Expenses: Not hard at all  Food Insecurity: No Food Insecurity (09/22/2021)   Hunger Vital Sign   . Worried About Charity fundraiser in the Last Year: Never true   . Ran Out of Food in the Last Year: Never true  Transportation Needs: No Transportation Needs (09/22/2021)   PRAPARE - Transportation   . Lack of Transportation (Medical): No   . Lack of Transportation (Non-Medical): No  Physical Activity: Inactive (09/22/2021)   Exercise Vital Sign   . Days of Exercise per Week: 0 days   . Minutes of Exercise per Session: 0 min  Stress: Stress Concern Present (09/22/2021)   [place]   . Feeling of Stress : To some extent  Social Connections: Unknown (09/22/2021)   Social Connection and Isolation Panel [NHANES]   . Frequency of Communication with Friends and Family: Twice a week   . Frequency of Social Gatherings with Friends and Family: Once a week   . Attends Religious Services: More than 4 times per year   . Active Member of Clubs or Organizations: Yes   . Attends Archivist  Meetings: More than 4 times per year   . Marital Status: Patient refused   Family History  Problem Relation Age of Onset  . Healthy Father   . Healthy Mother   . Anesthesia problems Neg Hx    No Known Allergies Current Outpatient Medications  Medication Sig Dispense Refill  . amphetamine-dextroamphetamine (ADDERALL) 5 MG tablet Take 1 tablet by mouth daily.    . ciclopirox (PENLAC) 8 % solution Apply topically at bedtime.    . diphenhydrAMINE (BENADRYL) 25 MG tablet Take 1 tablet (25 mg total) by mouth at bedtime as needed for up to 15 doses for sleep. 15 tablet 0  . hydrOXYzine (ATARAX) 25 MG tablet Take 25 mg by mouth every 8 (eight) hours as needed.    Marland Kitchen ibuprofen (ADVIL) 600 MG tablet Take 1 tablet (600 mg total) by mouth every 8 (eight) hours as needed for up to 30 doses for mild pain or moderate pain. 30 tablet 0  . metoCLOPramide  (REGLAN) 10 MG tablet Take 1 tablet (10 mg total) by mouth at bedtime as needed for up to 10 doses for nausea. 10 tablet 0  . Vitamin D, Ergocalciferol, (DRISDOL) 1.25 MG (50000 UNIT) CAPS capsule Take 50,000 Units by mouth once a week.     No current facility-administered medications for this visit.   No results found.  Review of Systems:   A ROS was performed including pertinent positives and negatives as documented in the HPI.  Physical Exam :   Constitutional: NAD and appears stated age Neurological: Alert and oriented Psych: Appropriate affect and cooperative There were no vitals taken for this visit.   Comprehensive Musculoskeletal Exam:    Inspection Right Left  Skin No atrophy or gross abnormalities appreciated No atrophy or gross abnormalities appreciated  Palpation    Tenderness None None  Crepitus None None  Range of Motion    Flexion (passive) 120 120  Extension 30 30  IR 30 with pain 30  ER 50 50  Strength    Flexion  5/5 5/5  Extension 5/5 5/5  Special Tests    FABER Negative Negative  FADIR Positive Negative  ER Lag/Capsular Insufficiency Negative Negative  Instability Negative Negative  Sacroiliac pain Negative  Negative   Instability    Generalized Laxity No No  Neurologic    sciatic, femoral, obturator nerves intact to light sensation  Vascular/Lymphatic    DP pulse 2+ 2+  Lumbar Exam    Patient has symmetric lumbar range of motion with negative pain referral to hip     Imaging:   Xray (3 views right hip): Significant acetabular dysplasia with lateral center edge angle 20 degrees  MRI (right hip): Significant right hip acetabular dysplasia with again lateral center edge angle of 19 degrees  I personally reviewed and interpreted the radiographs.   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  Ultimately I did discuss that given the severity of her acetabular dysplasia that I do believe  that ultimate surgical management may require periacetabular osteotomy with hip arthroscopy and labral repair possible reconstruction.  I did discuss that given the fact this is a very large procedure that I do believe it would be wise to begin with right hip ultrasound-guided injection as well as a strong therapy program for gluteal strengthening.  I would like her to work on that her dynamic Stabilizers.  We did discuss that should this not result in  complete success that we would consider referral to [facility] for discussion of acetabular osteotomy with hip arthroscopy.  After long discussion she has elected initially for right hip ultrasound-guided injection we will plan to send her for physical therapy for dynamic hip stabilizer program.  Plan :    -Right hip ultrasound-guided injection performed after verbal consent obtained    Procedure Note  Patient: Eniola Cerullo             Date of Birth: [birth year]           MRN: [id number]             Visit Date: 06/23/2022  Procedures: Visit Diagnoses:  1. Pain in right hip     Large Joint Inj: R hip joint on 06/23/2022 4:59 PM Indications: pain Details: 22 G 3.5 in needle, ultrasound-guided anterolateral approach  Arthrogram: No  Medications: 4 mL lidocaine 1 %; 80 mg triamcinolone acetonide 40 MG/ML Outcome: tolerated well, no immediate complications Procedure, treatment alternatives, risks and benefits explained, specific risks discussed. Consent was given by the patient. Immediately prior to procedure a time out was called to verify the correct patient, procedure, equipment, support staff and site/side marked as required. Patient was prepped and draped in the usual sterile fashion.          I personally saw and evaluated the patient, and participated in the management and treatment plan.  Huel Cote, MD Attending Physician, Orthopedic Surgery  This document was dictated using Dragon voice recognition software. A reasonable  attempt at proof reading has been made to minimize errors.

## 2022-07-16 NOTE — Therapy (Signed)
OUTPATIENT PHYSICAL THERAPY LOWER EXTREMITY EVALUATION   Patient Name: Linda Pollard MRN: [id number] DOB:[birth year], 32 y.o., female Today's Date: 07/16/2022  END OF SESSION:   Past Medical History:  Diagnosis Date   ADHD    Anxiety    Gallstone    Nexplanon insertion 01/01/2014   nexplanon inserted 01/01/14 left arm remove 01/01/17   Pregnancy with one fetus    Past Surgical History:  Procedure Laterality Date   CHOLECYSTECTOMY  12/03/2011   Procedure: LAPAROSCOPIC CHOLECYSTECTOMY;  Surgeon: Jamesetta So, MD;  Location: [facility];  Service: General;  Laterality: N/A;   NO PAST SURGERIES     Patient Active Problem List   Diagnosis Date Noted   Depression 09/22/2021   Urinary incontinence 09/22/2021    PCP: [facility]  REFERRING PROVIDER:  Vanetta Mulders, MD [facility]    REFERRING DIAG: M25.551 (ICD-10-CM) - Pain in right hip  THERAPY DIAG:  No diagnosis found.  Rationale for Evaluation and Treatment: Rehabilitation  ONSET DATE: ***  SUBJECTIVE:   SUBJECTIVE STATEMENT: ***  PERTINENT HISTORY: *** PAIN:  Are you having pain? {OPRCPAIN:27236}  PRECAUTIONS: {Therapy precautions:24002}  WEIGHT BEARING RESTRICTIONS: {Yes ***/No:24003}  FALLS:  Has patient fallen in last 6 months? {fallsyesno:27318}  LIVING ENVIRONMENT: Lives with: {OPRC lives with:25569::"lives with their family"} Lives in: {Lives in:25570} Stairs: {opstairs:27293} Has following equipment at home: {Assistive devices:23999}  OCCUPATION: ***  PLOF: {PLOF:24004}  PATIENT GOALS: ***  NEXT MD VISIT: ***  OBJECTIVE:   DIAGNOSTIC FINDINGS: ***  PATIENT SURVEYS:  {rehab surveys:24030}  COGNITION: Overall cognitive status: {cognition:24006}     SENSATION: {sensation:27233}  EDEMA:  {edema:24020}  MUSCLE LENGTH: Hamstrings: Right *** deg; Left *** deg Thomas test: Right *** deg; Left *** deg  POSTURE:  {posture:25561}  PALPATION: ***  LOWER EXTREMITY ROM:  {AROM/PROM:27142} ROM Right eval Left eval  Hip flexion    Hip extension    Hip abduction    Hip adduction    Hip internal rotation    Hip external rotation    Knee flexion    Knee extension    Ankle dorsiflexion    Ankle plantarflexion    Ankle inversion    Ankle eversion     (Blank rows = not tested)  LOWER EXTREMITY MMT:  MMT Right eval Left eval  Hip flexion    Hip extension    Hip abduction    Hip adduction    Hip internal rotation    Hip external rotation    Knee flexion    Knee extension    Ankle dorsiflexion    Ankle plantarflexion    Ankle inversion    Ankle eversion     (Blank rows = not tested)  LOWER EXTREMITY SPECIAL TESTS:  {LEspecialtests:26242}  FUNCTIONAL TESTS:  {Functional tests:24029}  GAIT: Distance walked: *** Assistive device utilized: {Assistive devices:23999} Level of assistance: {Levels of assistance:24026} Comments: ***   TODAY'S TREATMENT:  DATE: 07/16/2022 physical therapy evaluation and HEP intruction    PATIENT EDUCATION:  Education details: Patient educated on exam findings, POC, scope of PT, HEP, and ***. Person educated: Patient Education method: Explanation, Demonstration, and Handouts Education comprehension: verbalized understanding, returned demonstration, verbal cues required, and tactile cues required   HOME EXERCISE PROGRAM: ***  ASSESSMENT:  CLINICAL IMPRESSION: Patient is a *** y.o. *** who was seen today for physical therapy evaluation and treatment for ***. Patient is a *** y.o. *** who presents to physical therapy with complaint of ***. Patient demonstrates muscle weakness, reduced ROM, and fascial restrictions which are likely contributing to symptoms of pain and are negatively impacting patient ability to perform ADLs and  functional mobility tasks. Patient will benefit from skilled physical therapy services to address these deficits to reduce pain and improve level of function with ADLs and functional mobility tasks.   OBJECTIVE IMPAIRMENTS: {opptimpairments:25111}.   ACTIVITY LIMITATIONS: {activitylimitations:27494}  PARTICIPATION LIMITATIONS: {participationrestrictions:25113}  PERSONAL FACTORS: {Personal factors:25162} are also affecting patient's functional outcome.   REHAB POTENTIAL: {rehabpotential:25112}  CLINICAL DECISION MAKING: {clinical decision making:25114}  EVALUATION COMPLEXITY: {Evaluation complexity:25115}   GOALS: Goals reviewed with patient? Yes  SHORT TERM GOALS: Target date: *** patient will be independent with initial HEP Baseline: Goal status: INITIAL  2.  Patient will self report 30% improvement to improve tolerance for functional activity  Baseline:  Goal status: INITIAL  3.  *** Baseline:  Goal status: {GOALSTATUS:25110}  4.  *** Baseline:  Goal status: {GOALSTATUS:25110}  5.  *** Baseline:  Goal status: {GOALSTATUS:25110}  6.  *** Baseline:  Goal status: {GOALSTATUS:25110}  LONG TERM GOALS: Target date: ***  Patient will be independent in self management strategies to improve quality of life and functional outcomes.  Baseline:  Goal status: INITIAL  2.  Patient will self report 50% improvement to improve tolerance for functional activity  Baseline:  Goal status: INITIAL  3.  *** Baseline:  Goal status: {GOALSTATUS:25110}  4.  *** Baseline:  Goal status: {GOALSTATUS:25110}  5.  *** Baseline:  Goal status: {GOALSTATUS:25110}  6.  *** Baseline:  Goal status: {GOALSTATUS:25110}   PLAN:  PT FREQUENCY: {rehab frequency:25116}  PT DURATION: {rehab duration:25117}  PLANNED INTERVENTIONS: Therapeutic exercises, Therapeutic activity, Neuromuscular re-education, Balance training, Gait training, Patient/Family education, Joint  manipulation, Joint mobilization, Stair training, Orthotic/Fit training, DME instructions, Aquatic Therapy, Dry Needling, Electrical stimulation, Spinal manipulation, Spinal mobilization, Cryotherapy, Moist heat, Compression bandaging, scar mobilization, Splintting, Taping, Traction, Ultrasound, Ionotophoresis 78m/ml Dexamethasone, and Manual therapy   PLAN FOR NEXT SESSION: Review HEP and goals;    9:18 AM, 07/16/22 Levoy Geisen Small Sadrac Zeoli MPT [facility] [place] #[phone]

## 2022-07-19 ENCOUNTER — Ambulatory Visit (HOSPITAL_COMMUNITY): Payer: BC Managed Care – PPO | Attending: Orthopaedic Surgery

## 2022-07-19 ENCOUNTER — Other Ambulatory Visit: Payer: Self-pay

## 2022-07-19 DIAGNOSIS — R279 Unspecified lack of coordination: Secondary | ICD-10-CM | POA: Diagnosis present

## 2022-07-19 DIAGNOSIS — M25551 Pain in right hip: Secondary | ICD-10-CM | POA: Diagnosis present

## 2022-07-19 DIAGNOSIS — R262 Difficulty in walking, not elsewhere classified: Secondary | ICD-10-CM | POA: Diagnosis present

## 2022-07-19 DIAGNOSIS — M6281 Muscle weakness (generalized): Secondary | ICD-10-CM | POA: Diagnosis present

## 2022-07-19 DIAGNOSIS — M62838 Other muscle spasm: Secondary | ICD-10-CM | POA: Diagnosis present

## 2022-07-21 ENCOUNTER — Ambulatory Visit (HOSPITAL_COMMUNITY): Payer: BC Managed Care – PPO | Admitting: Physical Therapy

## 2022-07-21 ENCOUNTER — Encounter (HOSPITAL_COMMUNITY): Payer: Self-pay | Admitting: Physical Therapy

## 2022-07-21 DIAGNOSIS — R262 Difficulty in walking, not elsewhere classified: Secondary | ICD-10-CM

## 2022-07-21 DIAGNOSIS — M25551 Pain in right hip: Secondary | ICD-10-CM

## 2022-07-21 NOTE — Therapy (Signed)
OUTPATIENT PHYSICAL THERAPY TREATMENT   Patient Name: Linda Pollard MRN: [id number] DOB:1990/10/02, 32 y.o., female Today's Date: 07/21/2022  END OF SESSION:  PT End of Session - 07/21/22 0815     Visit Number 2    Number of Visits 8    Date for PT Re-Evaluation 08/16/22    Authorization Type [employer] PPO    Authorization Time Period 30 visit limit; no auth; 70.00 copay    Authorization - Visit Number 2    Authorization - Number of Visits 30    PT Start Time [phone]    PT Stop Time 0902    PT Time Calculation (min) 46 min    Activity Tolerance Patient tolerated treatment well    Behavior During Therapy [facility] for tasks assessed/performed             Past Medical History:  Diagnosis Date   ADHD    Anxiety    Gallstone    Nexplanon insertion 01/01/2014   nexplanon inserted 01/01/14 left arm remove 01/01/17   Pregnancy with one fetus    Past Surgical History:  Procedure Laterality Date   CHOLECYSTECTOMY  12/03/2011   Procedure: LAPAROSCOPIC CHOLECYSTECTOMY;  Surgeon: Jamesetta So, MD;  Location: [facility];  Service: General;  Laterality: N/A;   NO PAST SURGERIES     Patient Active Problem List   Diagnosis Date Noted   Depression 09/22/2021   Urinary incontinence 09/22/2021    PCP: [facility]  REFERRING PROVIDER:  Vanetta Mulders, MD [facility]    REFERRING DIAG: M25.551 (ICD-10-CM) - Pain in right hip  THERAPY DIAG:  Pain in right hip  Difficulty in walking, not elsewhere classified  Rationale for Evaluation and Treatment: Rehabilitation  ONSET DATE: 2013 when pregnant with her daughter; went away and then returned 2015 with pregnancy of her son  SUBJECTIVE:   SUBJECTIVE STATEMENT: Patient states hip is alright. No issues with HEP. Pain in the front of the hip  PERTINENT HISTORY: No other orthopedic issues PAIN:  Are you having pain? Yes: NPRS scale: 0/10 Pain location: right hip Pain  description: sharp occasionally,  Aggravating factors: sudden movement Relieving factors: rest  PRECAUTIONS: None  WEIGHT BEARING RESTRICTIONS: No  FALLS:  Has patient fallen in last 6 months? No   OCCUPATION: work at for the [employer]  PLOF: Independent  PATIENT GOALS: get my hip stronger  NEXT MD VISIT: March 20th, 2024  OBJECTIVE:   DIAGNOSTIC FINDINGS:  X-ray: Impression: Negative lumbar spine x-ray  X-ray:  Impression: [facility] pelvis demonstrating mild dysplasia of the right hip  MRI: IMPRESSION: 1. 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 2. 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 3. No findings of dysplasia involving the left hip.  PATIENT SURVEYS:  FOTO 72  COGNITION: Overall cognitive status: Within functional limits for tasks assessed     SENSATION: WFL  EDEMA:  None noted   PALPATION: Tender bilateral gr troch.  LOWER EXTREMITY ROM:  Active ROM Right eval Left eval  Hip flexion    Hip extension    Hip abduction    Hip adduction    Hip internal rotation 30 38  Hip external rotation 18* 52  Knee flexion    Knee extension    Ankle dorsiflexion    Ankle plantarflexion    Ankle inversion    Ankle eversion     (Blank rows = not tested)  LOWER  EXTREMITY MMT:  MMT Right eval Left eval  Hip flexion 3+ 5  Hip extension 4 4+  Hip abduction 4- 4+  Hip adduction    Hip internal rotation    Hip external rotation    Knee flexion 4 5  Knee extension 4+ 5  Ankle dorsiflexion 5 5  Ankle plantarflexion    Ankle inversion    Ankle eversion     (Blank rows = not tested)   FUNCTIONAL TESTS:  5 times sit to stand: 12.08 sec  GAIT: Distance walked: 50 ft Assistive device utilized: None Level of assistance: Complete Independence Comments: slight slower gait speed   TODAY'S TREATMENT:                                                                                                                               DATE:  07/21/22 Bridge 2 x 10  Supine piriformis stretch 3 x 20 second holds Supine piriformis stretch 3 x 20 second holds Supine active hamstring stretch 3 x 20 second holds Supine alternating march RTB at knees 2 x 10  Hooklying hip abduction isometrics alternated with adduction isometrics with belt and ball 5 x 10 second holds Prone hip extension 2 x 10 bilateral Sidelying hip  abduction 2x 10  Half kneeling hip flexor stretch with PPT 3 x 20 second holds Seated hip IR RTB at ankles 10 x 3 second holds  07/16/2022 physical therapy evaluation and HEP intruction    PATIENT EDUCATION:  Education details: 07/21/22: HEP.  EVAL: Patient educated on exam findings, POC, scope of PT, HEP, and what to expect next visit. Person educated: Patient Education method: Explanation, Demonstration, and Handouts Education comprehension: verbalized understanding, returned demonstration, verbal cues required, and tactile cues required   HOME EXERCISE PROGRAM: Access Code: [email or web address] URL: [email or web address]  07/21/22- Half Kneeling Hip Flexor Stretch  - 3 x daily - 7 x weekly - 3-5 reps - 20 second hold  Date: 07/19/2022 - Supine Bridge  - 2 x daily - 7 x weekly - 1 sets - 10 reps - 5 sec hold - Sidelying Hip Abduction  - 2 x daily - 7 x weekly - 1 sets - 10 reps - Prone Hip Extension  - 2 x daily - 7 x weekly - 1 sets - 10 reps - Supine March with Resistance Band  - 2 x daily - 7 x weekly - 1 sets - 10 reps  ASSESSMENT:  CLINICAL IMPRESSION: Began session with previously completed exercises which are performed with good mechanics without cueing. Patient with tender and hyperactive musculature throughout hip flexors/adductors but patient stating not necessarily concordant symptoms. Patient with decrease in symptoms following hip flexior stretch. Continued with hip strengthening which is tolerated well. Educated on hip anatomy  and dynamic hip stability with musculature. Patient will continue to benefit from physical therapy in order to improve function and reduce impairment.    OBJECTIVE IMPAIRMENTS: Abnormal gait, decreased activity tolerance,  decreased knowledge of condition, decreased mobility, difficulty walking, decreased ROM, decreased strength, hypomobility, increased fascial restrictions, impaired perceived functional ability, impaired flexibility, and pain.   ACTIVITY LIMITATIONS: lifting, bending, sitting, standing, squatting, sleeping, stairs, transfers, bed mobility, and caring for others  PARTICIPATION LIMITATIONS: meal prep, cleaning, laundry, shopping, community activity, occupation, and yard work     [employer] POTENTIAL: Good  CLINICAL DECISION MAKING: Stable/uncomplicated  EVALUATION COMPLEXITY: Low   GOALS: Goals reviewed with patient? No  SHORT TERM GOALS: Target date: 08/02/2022 patient will be independent with initial HEP Baseline: Goal status: INITIAL  2.  Patient will self report 30% improvement to improve tolerance for functional activity  Baseline:  Goal status: INITIAL   LONG TERM GOALS: Target date: 08/16/2022  Patient will be independent in self management strategies to improve quality of life and functional outcomes.  Baseline:  Goal status: INITIAL  2.  Patient will self report 50% improvement to improve tolerance for functional activity  Baseline:  Goal status: INITIAL  3.  Patient with increase right hip external rotation by 20 degrees (38 degrees) to improve ability to don her shoe on her right side Baseline: 15 Goal status: INITIAL  4.  Patient will increase right leg MMTs to 5/5 without pain to promote return to ambulation community distances with minimal deviation.    Baseline: see above Goal status: INITIAL  5.  Patient will improve FOTO score to predicted value    Baseline: 72 Goal status: INITIAL   PLAN:  PT FREQUENCY: 2x/week  PT DURATION:  4 weeks  PLANNED INTERVENTIONS: Therapeutic exercises, Therapeutic activity, Neuromuscular re-education, Balance training, Gait training, Patient/Family education, Joint manipulation, Joint mobilization, Stair training, Orthotic/Fit training, DME instructions, Aquatic Therapy, Dry Needling, Electrical stimulation, Spinal manipulation, Spinal mobilization, Cryotherapy, Moist heat, Compression bandaging, scar mobilization, Splintting, Taping, Traction, Ultrasound, Ionotophoresis 81m/ml Dexamethasone, and Manual therapy   PLAN FOR NEXT SESSION: Progress right hip mobility and strength as able; try to avoid increasing pain   9:02 AM, 07/21/22 AMearl LatinPT, DPT Physical Therapist [facility]

## 2022-07-26 ENCOUNTER — Encounter (HOSPITAL_COMMUNITY): Payer: Self-pay | Admitting: Physical Therapy

## 2022-07-26 ENCOUNTER — Ambulatory Visit (HOSPITAL_COMMUNITY): Payer: BC Managed Care – PPO | Admitting: Physical Therapy

## 2022-07-26 DIAGNOSIS — M25551 Pain in right hip: Secondary | ICD-10-CM

## 2022-07-26 DIAGNOSIS — R262 Difficulty in walking, not elsewhere classified: Secondary | ICD-10-CM

## 2022-07-26 NOTE — Therapy (Signed)
OUTPATIENT PHYSICAL THERAPY TREATMENT   Patient Name: Linda Pollard MRN: [id number] DOB:03-30-1991, 32 y.o., female Today's Date: 07/26/2022  END OF SESSION:  PT End of Session - 07/26/22 0941     Visit Number 3    Number of Visits 8    Date for PT Re-Evaluation 08/16/22    Authorization Type [employer] PPO    Authorization Time Period 30 visit limit; no auth; 70.00 copay    Authorization - Visit Number 3    Authorization - Number of Visits 30    PT Start Time [phone]    PT Stop Time 1020    PT Time Calculation (min) 38 min    Activity Tolerance Patient tolerated treatment well    Behavior During Therapy WFL for tasks assessed/performed             Past Medical History:  Diagnosis Date   ADHD    Anxiety    Gallstone    Nexplanon insertion 01/01/2014   nexplanon inserted 01/01/14 left arm remove 01/01/17   Pregnancy with one fetus    Past Surgical History:  Procedure Laterality Date   CHOLECYSTECTOMY  12/03/2011   Procedure: LAPAROSCOPIC CHOLECYSTECTOMY;  Surgeon: Jamesetta So, MD;  Location: [facility];  Service: General;  Laterality: N/A;   NO PAST SURGERIES     Patient Active Problem List   Diagnosis Date Noted   Depression 09/22/2021   Urinary incontinence 09/22/2021    PCP: [facility]  REFERRING PROVIDER:  Vanetta Mulders, MD [facility] [facility]    REFERRING DIAG: M25.551 (ICD-10-CM) - Pain in right hip  THERAPY DIAG:  Pain in right hip  Difficulty in walking, not elsewhere classified  Rationale for Evaluation and Treatment: Rehabilitation  ONSET DATE: 2013 when pregnant with her daughter; went away and then returned 2015 with pregnancy of her son  SUBJECTIVE:   SUBJECTIVE STATEMENT: Patient states she is doing alright. Was doing good with stretch. Saturday it was bothering her a lot and she was limping.   PERTINENT HISTORY: No other orthopedic issues PAIN:  Are you having pain? Yes: NPRS scale:  5/10 Pain location: right hip Pain description: sharp occasionally,  Aggravating factors: sudden movement Relieving factors: rest  PRECAUTIONS: None  WEIGHT BEARING RESTRICTIONS: No  FALLS:  Has patient fallen in last 6 months? No   OCCUPATION: work at for the [employer]  PLOF: Independent  PATIENT GOALS: get my hip stronger  NEXT MD VISIT: March 20th, 2024  OBJECTIVE:   DIAGNOSTIC FINDINGS:  X-ray: Impression: Negative lumbar spine x-ray  X-ray:  Impression: [facility] pelvis demonstrating mild dysplasia of the right hip  MRI: IMPRESSION: 1. 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 2. 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 3. No findings of dysplasia involving the left hip.  PATIENT SURVEYS:  FOTO 72  COGNITION: Overall cognitive status: Within functional limits for tasks assessed     SENSATION: WFL  EDEMA:  None noted   PALPATION: Tender bilateral gr troch.  LOWER EXTREMITY ROM:  Active ROM Right eval Left eval  Hip flexion    Hip extension    Hip abduction    Hip adduction    Hip internal rotation 30 38  Hip external rotation 18* 52  Knee flexion    Knee extension    Ankle dorsiflexion    Ankle plantarflexion    Ankle inversion    Ankle eversion     (  Blank rows = not tested)  LOWER EXTREMITY MMT:  MMT Right eval Left eval  Hip flexion 3+ 5  Hip extension 4 4+  Hip abduction 4- 4+  Hip adduction    Hip internal rotation    Hip external rotation    Knee flexion 4 5  Knee extension 4+ 5  Ankle dorsiflexion 5 5  Ankle plantarflexion    Ankle inversion    Ankle eversion     (Blank rows = not tested)   FUNCTIONAL TESTS:  5 times sit to stand: 12.08 sec  GAIT: Distance walked: 50 ft Assistive device utilized: None Level of assistance: Complete Independence Comments: slight slower gait speed   TODAY'S TREATMENT:                                                                                                                               DATE:  07/26/22 Half kneeling hip flexor stretch with PPT 5 x 20 second holds Prone quad stretch 3 x 20 second holds RLE Quadruped donkey kicks 2 x 10 bilateral  Quadruped fire hydrants 2 x 10 bilateral  Standing hip adductor stretch with overhead reach 3 x 20 second holds Dead bug isometric with green ball 2x 10 with 5 second holds Supine hip flexor isometric 90-90 1x 10 5 second holds Half kneeling hip flexor stretch with PPT 3 x 20 second holds  07/21/22 Bridge 2 x 10  Supine piriformis stretch 3 x 20 second holds Supine piriformis stretch 3 x 20 second holds Supine active hamstring stretch 3 x 20 second holds Supine alternating march RTB at knees 2 x 10  Hooklying hip abduction isometrics alternated with adduction isometrics with belt and ball 5 x 10 second holds Prone hip extension 2 x 10 bilateral Sidelying hip  abduction 2x 10  Half kneeling hip flexor stretch with PPT 3 x 20 second holds Seated hip IR RTB at ankles 10 x 3 second holds  07/16/2022 physical therapy evaluation and HEP intruction    PATIENT EDUCATION:  Education details: 07/21/22: HEP.  EVAL: Patient educated on exam findings, POC, scope of PT, HEP, and what to expect next visit. Person educated: Patient Education method: Explanation, Demonstration, and Handouts Education comprehension: verbalized understanding, returned demonstration, verbal cues required, and tactile cues required   HOME EXERCISE PROGRAM: Access Code: [email or web address] URL: [email or web address]  07/26/22 - Quadruped Bent Leg Hip Extension (Mirrored)  - 1 x daily - 7 x weekly - 2 sets - 10 reps - Quadruped Fire [employer]  - 1 x daily - 7 x weekly - 2 sets - 10 reps  07/21/22- Half Kneeling Hip Flexor Stretch  - 3 x daily - 7 x weekly - 3-5 reps - 20 second hold  Date: 07/19/2022 - Supine Bridge  - 2 x daily - 7 x weekly - 1 sets - 10 reps  - 5 sec hold - Sidelying Hip Abduction  - 2 x daily - 7 x  weekly - 1 sets - 10 reps - Prone Hip Extension  - 2 x daily - 7 x weekly - 1 sets - 10 reps - Supine March with Resistance Band  - 2 x daily - 7 x weekly - 1 sets - 10 reps  ASSESSMENT:  CLINICAL IMPRESSION: Began session with previously completed hip flexor stretch with minimal cueing required for PPT. Quadruped glute strengthening with increased difficulty on R due to impaired strength and mobility. Frequent cueing for core activation with dead bug. C/o hip pain with hip flexion isometric which decreases with additional hip flexor stretch. Patient will continue to benefit from physical therapy in order to improve function and reduce impairment.    OBJECTIVE IMPAIRMENTS: Abnormal gait, decreased activity tolerance, decreased knowledge of condition, decreased mobility, difficulty walking, decreased ROM, decreased strength, hypomobility, increased fascial restrictions, impaired perceived functional ability, impaired flexibility, and pain.   ACTIVITY LIMITATIONS: lifting, bending, sitting, standing, squatting, sleeping, stairs, transfers, bed mobility, and caring for others  PARTICIPATION LIMITATIONS: meal prep, cleaning, laundry, shopping, community activity, occupation, and yard work     [employer] POTENTIAL: Good  CLINICAL DECISION MAKING: Stable/uncomplicated  EVALUATION COMPLEXITY: Low   GOALS: Goals reviewed with patient? No  SHORT TERM GOALS: Target date: 08/02/2022 patient will be independent with initial HEP Baseline: Goal status: INITIAL  2.  Patient will self report 30% improvement to improve tolerance for functional activity  Baseline:  Goal status: INITIAL   LONG TERM GOALS: Target date: 08/16/2022  Patient will be independent in self management strategies to improve quality of life and functional outcomes.  Baseline:  Goal status: INITIAL  2.  Patient will self report 50% improvement to improve tolerance  for functional activity  Baseline:  Goal status: INITIAL  3.  Patient with increase right hip external rotation by 20 degrees (38 degrees) to improve ability to don her shoe on her right side Baseline: 15 Goal status: INITIAL  4.  Patient will increase right leg MMTs to 5/5 without pain to promote return to ambulation community distances with minimal deviation.    Baseline: see above Goal status: INITIAL  5.  Patient will improve FOTO score to predicted value    Baseline: 72 Goal status: INITIAL   PLAN:  PT FREQUENCY: 2x/week  PT DURATION: 4 weeks  PLANNED INTERVENTIONS: Therapeutic exercises, Therapeutic activity, Neuromuscular re-education, Balance training, Gait training, Patient/Family education, Joint manipulation, Joint mobilization, Stair training, Orthotic/Fit training, DME instructions, Aquatic Therapy, Dry Needling, Electrical stimulation, Spinal manipulation, Spinal mobilization, Cryotherapy, Moist heat, Compression bandaging, scar mobilization, Splintting, Taping, Traction, Ultrasound, Ionotophoresis 15m/ml Dexamethasone, and Manual therapy   PLAN FOR NEXT SESSION: Progress right hip mobility and strength as able; try to avoid increasing pain   9:42 AM, 07/26/22 AMearl LatinPT, DPT [facility]

## 2022-07-29 ENCOUNTER — Ambulatory Visit (HOSPITAL_COMMUNITY): Payer: BC Managed Care – PPO | Admitting: Physical Therapy

## 2022-07-29 DIAGNOSIS — M62838 Other muscle spasm: Secondary | ICD-10-CM

## 2022-07-29 DIAGNOSIS — M6281 Muscle weakness (generalized): Secondary | ICD-10-CM

## 2022-07-29 DIAGNOSIS — M25551 Pain in right hip: Secondary | ICD-10-CM

## 2022-07-29 DIAGNOSIS — R262 Difficulty in walking, not elsewhere classified: Secondary | ICD-10-CM

## 2022-07-29 DIAGNOSIS — R279 Unspecified lack of coordination: Secondary | ICD-10-CM

## 2022-07-29 NOTE — Therapy (Signed)
OUTPATIENT PHYSICAL THERAPY TREATMENT   Patient Name: Linda Pollard MRN: [id number] DOB:[birth year], 32 y.o., female Today's Date: 07/29/2022  END OF SESSION:  PT End of Session - 07/29/22 1633     Visit Number 4    Number of Visits 8    Date for PT Re-Evaluation 08/16/22    Authorization Type [employer] PPO    Authorization Time Period 30 visit limit; no auth; 70.00 copay    Authorization - Number of Visits 30    PT Start Time [id number]    PT Stop Time 1715    PT Time Calculation (min) 40 min    Activity Tolerance Patient tolerated treatment well    Behavior During Therapy WFL for tasks assessed/performed             Past Medical History:  Diagnosis Date   ADHD    Anxiety    Gallstone    Nexplanon insertion 01/01/2014   nexplanon inserted 01/01/14 left arm remove 01/01/17   Pregnancy with one fetus    Past Surgical History:  Procedure Laterality Date   CHOLECYSTECTOMY  12/03/2011   Procedure: LAPAROSCOPIC CHOLECYSTECTOMY;  Surgeon: Jamesetta So, MD;  Location: [facility];  Service: General;  Laterality: N/A;   NO PAST SURGERIES     Patient Active Problem List   Diagnosis Date Noted   Depression 09/22/2021   Urinary incontinence 09/22/2021    PCP: [facility]  REFERRING PROVIDER:  Vanetta Mulders, MD [place] [facility]    REFERRING DIAG: M25.551 (ICD-10-CM) - Pain in right hip  THERAPY DIAG:  Pain in right hip  Difficulty in walking, not elsewhere classified  Muscle weakness (generalized)  Other muscle spasm  Unspecified lack of coordination  Rationale for Evaluation and Treatment: Rehabilitation  ONSET DATE: 2013 when pregnant with her daughter; went away and then returned 2015 with pregnancy of her son  SUBJECTIVE:   SUBJECTIVE STATEMENT: Patient states she is doing good today, just some dull pain, nothing that is bothering her.   PERTINENT HISTORY: No other orthopedic issues PAIN:  Are you having  pain? Yes: NPRS scale: 5/10 Pain location: right hip Pain description: sharp occasionally,  Aggravating factors: sudden movement Relieving factors: rest  PRECAUTIONS: None  WEIGHT BEARING RESTRICTIONS: No  FALLS:  Has patient fallen in last 6 months? No   OCCUPATION: work at for the [employer]  PLOF: Independent  PATIENT GOALS: get my hip stronger  NEXT MD VISIT: March 20th, 2024  OBJECTIVE:   DIAGNOSTIC FINDINGS:  X-ray: Impression: Negative lumbar spine x-ray  X-ray:  Impression: [facility] pelvis demonstrating mild dysplasia of the right hip  MRI: IMPRESSION: 1. 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 2. 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 3. No findings of dysplasia involving the left hip.  PATIENT SURVEYS:  FOTO 72  COGNITION: Overall cognitive status: Within functional limits for tasks assessed     SENSATION: WFL  EDEMA:  None noted   PALPATION: Tender bilateral gr troch.  LOWER EXTREMITY ROM:  Active ROM Right eval Left eval  Hip flexion    Hip extension    Hip abduction    Hip adduction    Hip internal rotation 30 38  Hip external rotation 18* 52  Knee flexion    Knee extension    Ankle dorsiflexion    Ankle plantarflexion    Ankle inversion    Ankle eversion     (Blank  rows = not tested)  LOWER EXTREMITY MMT:  MMT Right eval Left eval  Hip flexion 3+ 5  Hip extension 4 4+  Hip abduction 4- 4+  Hip adduction    Hip internal rotation    Hip external rotation    Knee flexion 4 5  Knee extension 4+ 5  Ankle dorsiflexion 5 5  Ankle plantarflexion    Ankle inversion    Ankle eversion     (Blank rows = not tested)   FUNCTIONAL TESTS:  5 times sit to stand: 12.08 sec  GAIT: Distance walked: 50 ft Assistive device utilized: None Level of assistance: Complete Independence Comments: slight slower gait speed   TODAY'S TREATMENT:                                                                                                                               DATE:  07/29/22 Half kneeling hip flexor stretch with PPT 2 x 20 second holds  Kneeling hurting knees so completed last 2 in standing on stairs 2X20" Prone quad stretch 3 x 20 second holds RLE Quadruped donkey kicks 2 x 10 bilateral  Quadruped fire hydrants 2 x 10 bilateral  Sidelying hip  abduction 2x 10  Standing hip adductor stretch with overhead reach 3 x 20 second holds Dead bug isometric with green ball 2x 10 with 5 second holds Bridge with feet on green ball 2X10 5" holds Supine hip flexor isometric 90-90 2x 10 5 second holds Seated piriformis stretch 1 minute each side  07/26/22 Half kneeling hip flexor stretch with PPT 5 x 20 second holds Prone quad stretch 3 x 20 second holds RLE Quadruped donkey kicks 2 x 10 bilateral  Quadruped fire hydrants 2 x 10 bilateral  Standing hip adductor stretch with overhead reach 3 x 20 second holds Dead bug isometric with green ball 2x 10 with 5 second holds Supine hip flexor isometric 90-90 1x 10 5 second holds Half kneeling hip flexor stretch with PPT 3 x 20 second holds  07/21/22 Bridge 2 x 10  Supine piriformis stretch 3 x 20 second holds Supine piriformis stretch 3 x 20 second holds Supine active hamstring stretch 3 x 20 second holds Supine alternating march RTB at knees 2 x 10  Hooklying hip abduction isometrics alternated with adduction isometrics with belt and ball 5 x 10 second holds Prone hip extension 2 x 10 bilateral Sidelying hip  abduction 2x 10  Half kneeling hip flexor stretch with PPT 3 x 20 second holds Seated hip IR RTB at ankles 10 x 3 second holds  07/16/2022 physical therapy evaluation and HEP intruction    PATIENT EDUCATION:  Education details: 07/21/22: HEP.  EVAL: Patient educated on exam findings, POC, scope of PT, HEP, and what to expect next visit. Person educated: Patient Education method:  Explanation, Demonstration, and Handouts Education comprehension: verbalized understanding, returned demonstration, verbal cues required, and tactile cues required   HOME EXERCISE PROGRAM:  Access Code: [email or web address] URL: [email or web address]  07/26/22 - Quadruped Bent Leg Hip Extension (Mirrored)  - 1 x daily - 7 x weekly - 2 sets - 10 reps - Quadruped Fire Hydrant  - 1 x daily - 7 x weekly - 2 sets - 10 reps  07/21/22- Half Kneeling Hip Flexor Stretch  - 3 x daily - 7 x weekly - 3-5 reps - 20 second hold  Date: 07/19/2022 - Supine Bridge  - 2 x daily - 7 x weekly - 1 sets - 10 reps - 5 sec hold - Sidelying Hip Abduction  - 2 x daily - 7 x weekly - 1 sets - 10 reps - Prone Hip Extension  - 2 x daily - 7 x weekly - 1 sets - 10 reps - Supine March with Resistance Band  - 2 x daily - 7 x weekly - 1 sets - 10 reps  ASSESSMENT:  CLINICAL IMPRESSION: Kneeling hip flexor stretch causing knee pain this session, even with use of foam.  Instructed with standing hip flexor stretch on stairs with better results.  Improved activation of core with dead bug activity however still challenging due to abdominal weakness.  Increased challenge of bridge with ball to engage core mm.   Minimal cues needed with therex; no c/o hip pain with activities. Extreme tightness in Rt piriformis demonstrated by added seated stretch.  Patient will continue to benefit from physical therapy in order to improve function and reduce impairment.    OBJECTIVE IMPAIRMENTS: Abnormal gait, decreased activity tolerance, decreased knowledge of condition, decreased mobility, difficulty walking, decreased ROM, decreased strength, hypomobility, increased fascial restrictions, impaired perceived functional ability, impaired flexibility, and pain.   ACTIVITY LIMITATIONS: lifting, bending, sitting, standing, squatting, sleeping, stairs, transfers, bed mobility, and caring for others  PARTICIPATION LIMITATIONS: meal prep, cleaning,  laundry, shopping, community activity, occupation, and yard work     [employer] POTENTIAL: Good  CLINICAL DECISION MAKING: Stable/uncomplicated  EVALUATION COMPLEXITY: Low   GOALS: Goals reviewed with patient? No  SHORT TERM GOALS: Target date: 08/02/2022 patient will be independent with initial HEP Baseline: Goal status: INITIAL  2.  Patient will self report 30% improvement to improve tolerance for functional activity  Baseline:  Goal status: INITIAL   LONG TERM GOALS: Target date: 08/16/2022  Patient will be independent in self management strategies to improve quality of life and functional outcomes.  Baseline:  Goal status: INITIAL  2.  Patient will self report 50% improvement to improve tolerance for functional activity  Baseline:  Goal status: INITIAL  3.  Patient with increase right hip external rotation by 20 degrees (38 degrees) to improve ability to don her shoe on her right side Baseline: 15 Goal status: INITIAL  4.  Patient will increase right leg MMTs to 5/5 without pain to promote return to ambulation community distances with minimal deviation.    Baseline: see above Goal status: INITIAL  5.  Patient will improve FOTO score to predicted value    Baseline: 72 Goal status: INITIAL   PLAN:  PT FREQUENCY: 2x/week  PT DURATION: 4 weeks  PLANNED INTERVENTIONS: Therapeutic exercises, Therapeutic activity, Neuromuscular re-education, Balance training, Gait training, Patient/Family education, Joint manipulation, Joint mobilization, Stair training, Orthotic/Fit training, DME instructions, Aquatic Therapy, Dry Needling, Electrical stimulation, Spinal manipulation, Spinal mobilization, Cryotherapy, Moist heat, Compression bandaging, scar mobilization, Splintting, Taping, Traction, Ultrasound, Ionotophoresis 42m/ml Dexamethasone, and Manual therapy   PLAN FOR NEXT SESSION: Progress right hip mobility and strength as able; try to avoid increasing  pain   4:35 PM, 07/29/22 Teena Irani, PTA/CLT [place] Ph: [phone]

## 2022-08-03 ENCOUNTER — Encounter: Payer: Self-pay | Admitting: Women's Health

## 2022-08-03 ENCOUNTER — Other Ambulatory Visit (HOSPITAL_COMMUNITY)
Admission: RE | Admit: 2022-08-03 | Discharge: 2022-08-03 | Disposition: A | Discharge status: Home / Self Care | Payer: BC Managed Care – PPO | Source: Ambulatory Visit | Attending: Women's Health | Admitting: Women's Health

## 2022-08-03 ENCOUNTER — Ambulatory Visit (INDEPENDENT_AMBULATORY_CARE_PROVIDER_SITE_OTHER): Payer: BC Managed Care – PPO | Admitting: Women's Health

## 2022-08-03 VITALS — BP 129/69 | HR 88 | Ht 59.0 in | Wt 189.8 lb

## 2022-08-03 DIAGNOSIS — N926 Irregular menstruation, unspecified: Secondary | ICD-10-CM

## 2022-08-03 DIAGNOSIS — Z3202 Encounter for pregnancy test, result negative: Secondary | ICD-10-CM | POA: Diagnosis not present

## 2022-08-03 LAB — POCT URINE PREGNANCY: Preg Test, Ur: NEGATIVE

## 2022-08-03 NOTE — Progress Notes (Signed)
GYN VISIT Patient name: Linda Pollard MRN [id number]  Date of birth: 08-May-1991 Chief Complaint:   Painful period, lasted longer  History of Present Illness:   Linda Pollard is a 32 y.o. [id number] Hispanic female being seen today for prolonged period. Not on birth control. Has noticed recently period will sometimes skip a month. Usually only lasts 5-7d, no cramps. This period has lasted 3wks and has been painful. Denies excessive hair chin/neck/breasts/abd, bad acne, hair loss. Does have darker areas on neck. Has never been dx w/ PCOS in past. Has noticed some odor. No itching/irritation.   Patient's last menstrual period was 07/16/2022. The current method of family planning is coitus interruptus and condoms.  Last pap 09/22/21. Results were: NILM w/ HRHPV negative     09/22/2021    9:28 AM  Depression screen PHQ 2/9  Decreased Interest 2  Down, Depressed, Hopeless 0  PHQ - 2 Score 2  Altered sleeping 1  Tired, decreased energy 0  Change in appetite 0  Feeling bad or failure about yourself  0  Trouble concentrating 3  Moving slowly or fidgety/restless 0  Suicidal thoughts 0  PHQ-9 Score 6        09/22/2021    9:28 AM  GAD 7 : Generalized Anxiety Score  Nervous, Anxious, on Edge 0  Control/stop worrying 1  Worry too much - different things 1  Trouble relaxing 2  Restless 0  Easily annoyed or irritable 2  Afraid - awful might happen 0  Total GAD 7 Score 6     Review of Systems:   Pertinent items are noted in HPI Denies fever/chills, dizziness, headaches, visual disturbances, fatigue, shortness of breath, chest pain, abdominal pain, vomiting, abnormal vaginal discharge/itching/odor/irritation, problems with periods, bowel movements, urination, or intercourse unless otherwise stated above.  Pertinent History Reviewed:  Reviewed past medical,surgical, social, obstetrical and family history.  Reviewed problem list, medications and allergies. Physical Assessment:    Vitals:   08/03/22 0838  BP: 129/69  Pulse: 88  Weight: 189 lb 12.8 oz (86.1 kg)  Height: '4\' 11"'$  (1.499 m)  Body mass index is 38.33 kg/m.       Physical Examination:   General appearance: alert, well appearing, and in no distress  Mental status: alert, oriented to person, place, and time  Skin: warm & dry, +acanthosis nigricans on neck  Cardiovascular: normal heart rate noted  Respiratory: normal respiratory effort, no distress  Abdomen: soft, non-tender   Pelvic: VULVA: normal appearing vulva with no masses, tenderness or lesions, VAGINA: normal appearing vagina with normal color and discharge, no lesions, small amt light menstrual blood CERVIX: normal appearing cervix without discharge or lesions  Extremities: no edema   Chaperone: Peggy Dones    No results found for this or any previous visit (from the past 24 hour(s)).  Assessment & Plan:  1) Prolonged, painful period> this month only, but periods have been becoming irregular (skipping a month). CV swab collected. Discussed possibility of PCOS, but doesn't really have hyperadrogenism symptoms. Discussed option of birth control to help regulate, pt ok w/ her current method right now. Advised if goes 7mhs w/o a period and not pregnant, let [place]now- would need to start a period. Also, if decides for birth control to regulate periods, let [place]now. Discussed weight loss, even 10% can be beneficial in regulating cycles.    Meds: No orders of the defined types were placed in this encounter.   Orders Placed This Encounter  Procedures  POCT urine pregnancy    Return in about 1 year (around 08/04/2023) for Physical.  [place], WHNP-BC 08/03/2022 9:07 AM

## 2022-08-04 ENCOUNTER — Encounter (HOSPITAL_COMMUNITY): Payer: Self-pay | Admitting: Physical Therapy

## 2022-08-04 ENCOUNTER — Ambulatory Visit (HOSPITAL_COMMUNITY): Payer: BC Managed Care – PPO | Admitting: Physical Therapy

## 2022-08-04 DIAGNOSIS — R262 Difficulty in walking, not elsewhere classified: Secondary | ICD-10-CM

## 2022-08-04 DIAGNOSIS — M25551 Pain in right hip: Secondary | ICD-10-CM | POA: Diagnosis not present

## 2022-08-04 LAB — CERVICOVAGINAL ANCILLARY ONLY
Bacterial Vaginitis (gardnerella): NEGATIVE
Candida Glabrata: NEGATIVE
Candida Vaginitis: NEGATIVE
Chlamydia: NEGATIVE
Comment: NEGATIVE
Comment: NEGATIVE
Comment: NEGATIVE
Comment: NEGATIVE
Comment: NEGATIVE
Comment: NORMAL
Neisseria Gonorrhea: NEGATIVE
Trichomonas: NEGATIVE

## 2022-08-04 NOTE — Therapy (Signed)
OUTPATIENT PHYSICAL THERAPY TREATMENT   Patient Name: Linda Pollard MRN: [id number] DOB:06-May-1991, 32 y.o., female Today's Date: 08/04/2022  END OF SESSION:  PT End of Session - 08/04/22 0815     Visit Number 5    Number of Visits 8    Date for PT Re-Evaluation 08/16/22    Authorization Type [employer] Comm PPO    Authorization Time Period 30 visit limit; no auth; 70.00 copay    Authorization - Visit Number 5    Authorization - Number of Visits 30    PT Start Time 0815    PT Stop Time [id number]    PT Time Calculation (min) 42 min    Activity Tolerance Patient tolerated treatment well    Behavior During Therapy WFL for tasks assessed/performed             Past Medical History:  Diagnosis Date   ADHD    Anxiety    Gallstone    Nexplanon insertion 01/01/2014   nexplanon inserted 01/01/14 left arm remove 01/01/17   Pregnancy with one fetus    Past Surgical History:  Procedure Laterality Date   CHOLECYSTECTOMY  12/03/2011   Procedure: LAPAROSCOPIC CHOLECYSTECTOMY;  Surgeon: Jamesetta So, MD;  Location: [facility];  Service: General;  Laterality: N/A;   NO PAST SURGERIES     Patient Active Problem List   Diagnosis Date Noted   Depression 09/22/2021   Urinary incontinence 09/22/2021    PCP: [facility]  REFERRING PROVIDER:  Vanetta Mulders, MD [place] [facility]    REFERRING DIAG: M25.551 (ICD-10-CM) - Pain in right hip  THERAPY DIAG:  Pain in right hip  Difficulty in walking, not elsewhere classified  Rationale for Evaluation and Treatment: Rehabilitation  ONSET DATE: 2013 when pregnant with her daughter; went away and then returned 2015 with pregnancy of her son  SUBJECTIVE:   SUBJECTIVE STATEMENT: Patient states hip is alright. Feels like hip is about the same. Exercises sometimes help but then the next day gets about the same. Doesn't stay for long periods. Exercises help a little bit and then symptoms come back  later and exercises help a little bit. Feels like shot is wearing off because she can't stretch it like she was. Hip feels stronger but pain still there. Pain running down leg like it was before down to ankle.   PERTINENT HISTORY: No other orthopedic issues PAIN:  Are you having pain? Yes: NPRS scale: 3/10 Pain location: right hip Pain description: sharp occasionally,  Aggravating factors: sudden movement Relieving factors: rest  PRECAUTIONS: None  WEIGHT BEARING RESTRICTIONS: No  FALLS:  Has patient fallen in last 6 months? No   OCCUPATION: work at for the [employer]  PLOF: Independent  PATIENT GOALS: get my hip stronger  NEXT MD VISIT: March 20th, 2024  OBJECTIVE:   DIAGNOSTIC FINDINGS:  X-ray: Impression: Negative lumbar spine x-ray  X-ray:  Impression: [facility] pelvis demonstrating mild dysplasia of the right hip  MRI: IMPRESSION: 1. 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 2. 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 3. No findings of dysplasia involving the left hip.  PATIENT SURVEYS:  FOTO 72  COGNITION: Overall cognitive status: Within functional limits for tasks assessed     SENSATION: WFL  EDEMA:  None noted   PALPATION: Tender bilateral gr troch.  LOWER EXTREMITY ROM:  Active ROM Right eval Left eval  Hip flexion    Hip extension  Hip abduction    Hip adduction    Hip internal rotation 30 38  Hip external rotation 18* 52  Knee flexion    Knee extension    Ankle dorsiflexion    Ankle plantarflexion    Ankle inversion    Ankle eversion     (Blank rows = not tested)  LOWER EXTREMITY MMT:  MMT Right eval Left eval  Hip flexion 3+ 5  Hip extension 4 4+  Hip abduction 4- 4+  Hip adduction    Hip internal rotation    Hip external rotation    Knee flexion 4 5  Knee extension 4+ 5  Ankle dorsiflexion 5 5  Ankle plantarflexion    Ankle  inversion    Ankle eversion     (Blank rows = not tested)   FUNCTIONAL TESTS:  5 times sit to stand: 12.08 sec  GAIT: Distance walked: 50 ft Assistive device utilized: None Level of assistance: Complete Independence Comments: slight slower gait speed   TODAY'S TREATMENT:                                                                                                                              DATE:  08/04/22 Supine piriformis stretch 3 x 20 second holds RLE Supine figure 4 stretch 3 x 20 second holds RLE Manual STM and ischemic pressure to R hip flexor  Standing hip flexor stretch 5 x 20 second holds Supine hip flexor isometric 90-90 2x 10 5 second holds Manual hip flexor eccentric 90-90 into progressive extension 5 x 10 second holds Manual supine MET R hip extension L hip flexion 10 x 5 second holds  07/29/22 Half kneeling hip flexor stretch with PPT 2 x 20 second holds  Kneeling hurting knees so completed last 2 in standing on stairs 2X20" Prone quad stretch 3 x 20 second holds RLE Quadruped donkey kicks 2 x 10 bilateral  Quadruped fire hydrants 2 x 10 bilateral  Sidelying hip  abduction 2x 10  Standing hip adductor stretch with overhead reach 3 x 20 second holds Dead bug isometric with green ball 2x 10 with 5 second holds Bridge with feet on green ball 2X10 5" holds Supine hip flexor isometric 90-90 2x 10 5 second holds Seated piriformis stretch 1 minute each side  07/26/22 Half kneeling hip flexor stretch with PPT 5 x 20 second holds Prone quad stretch 3 x 20 second holds RLE Quadruped donkey kicks 2 x 10 bilateral  Quadruped fire hydrants 2 x 10 bilateral  Standing hip adductor stretch with overhead reach 3 x 20 second holds Dead bug isometric with green ball 2x 10 with 5 second holds Supine hip flexor isometric 90-90 1x 10 5 second holds Half kneeling hip flexor stretch with PPT 3 x 20 second holds  07/21/22 Bridge 2 x 10  Supine piriformis stretch 3 x 20 second  holds Supine piriformis stretch 3 x 20 second holds Supine active hamstring stretch 3  x 20 second holds Supine alternating march RTB at knees 2 x 10  Hooklying hip abduction isometrics alternated with adduction isometrics with belt and ball 5 x 10 second holds Prone hip extension 2 x 10 bilateral Sidelying hip  abduction 2x 10  Half kneeling hip flexor stretch with PPT 3 x 20 second holds Seated hip IR RTB at ankles 10 x 3 second holds  07/16/2022 physical therapy evaluation and HEP intruction    PATIENT EDUCATION:  Education details: 08/04/22: more frequent HEP performance 07/21/22: HEP.  EVAL: Patient educated on exam findings, POC, scope of PT, HEP, and what to expect next visit. Person educated: Patient Education method: Explanation, Demonstration, and Handouts Education comprehension: verbalized understanding, returned demonstration, verbal cues required, and tactile cues required   HOME EXERCISE PROGRAM: Access Code: [email or web address] URL: [email or web address] 08/04/22 - Hooklying Isometric Hip Flexion with Opposite Arm  - 2 x daily - 7 x weekly - 1-2 sets - 10 reps - 5 hold  07/26/22 - Quadruped Bent Leg Hip Extension (Mirrored)  - 1 x daily - 7 x weekly - 2 sets - 10 reps - Quadruped Fire Hydrant  - 1 x daily - 7 x weekly - 2 sets - 10 reps  07/21/22- Half Kneeling Hip Flexor Stretch  - 3 x daily - 7 x weekly - 3-5 reps - 20 second hold  Date: 07/19/2022 - Supine Bridge  - 2 x daily - 7 x weekly - 1 sets - 10 reps - 5 sec hold - Sidelying Hip Abduction  - 2 x daily - 7 x weekly - 1 sets - 10 reps - Prone Hip Extension  - 2 x daily - 7 x weekly - 1 sets - 10 reps - Supine March with Resistance Band  - 2 x daily - 7 x weekly - 1 sets - 10 reps  ASSESSMENT:  CLINICAL IMPRESSION: Patient with minimal change with piriformis stretches. Hyperactive and tender hip flexors with concordant symptoms and radiation distally. Decrease in tissue tension noted with manual and decrease  in symptoms following. Symptoms likely due to hip flexer hyperactivation and shortening. Continued with hip flexor lengthening exercises. Trail of MET with no change in symptoms - not experiencing symptoms following manual. Educated on completing at home and more frequent HEP performance. Patient will continue to benefit from physical therapy in order to improve function and reduce impairment.    OBJECTIVE IMPAIRMENTS: Abnormal gait, decreased activity tolerance, decreased knowledge of condition, decreased mobility, difficulty walking, decreased ROM, decreased strength, hypomobility, increased fascial restrictions, impaired perceived functional ability, impaired flexibility, and pain.   ACTIVITY LIMITATIONS: lifting, bending, sitting, standing, squatting, sleeping, stairs, transfers, bed mobility, and caring for others  PARTICIPATION LIMITATIONS: meal prep, cleaning, laundry, shopping, community activity, occupation, and yard work     [employer] POTENTIAL: Good  CLINICAL DECISION MAKING: Stable/uncomplicated  EVALUATION COMPLEXITY: Low   GOALS: Goals reviewed with patient? No  SHORT TERM GOALS: Target date: 08/02/2022 patient will be independent with initial HEP Baseline: Goal status: INITIAL  2.  Patient will self report 30% improvement to improve tolerance for functional activity  Baseline:  Goal status: INITIAL   LONG TERM GOALS: Target date: 08/16/2022  Patient will be independent in self management strategies to improve quality of life and functional outcomes.  Baseline:  Goal status: INITIAL  2.  Patient will self report 50% improvement to improve tolerance for functional activity  Baseline:  Goal status: INITIAL  3.  Patient with increase right hip external rotation  by 20 degrees (38 degrees) to improve ability to don her shoe on her right side Baseline: 15 Goal status: INITIAL  4.  Patient will increase right leg MMTs to 5/5 without pain to promote return to  ambulation community distances with minimal deviation.    Baseline: see above Goal status: INITIAL  5.  Patient will improve FOTO score to predicted value    Baseline: 72 Goal status: INITIAL   PLAN:  PT FREQUENCY: 2x/week  PT DURATION: 4 weeks  PLANNED INTERVENTIONS: Therapeutic exercises, Therapeutic activity, Neuromuscular re-education, Balance training, Gait training, Patient/Family education, Joint manipulation, Joint mobilization, Stair training, Orthotic/Fit training, DME instructions, Aquatic Therapy, Dry Needling, Electrical stimulation, Spinal manipulation, Spinal mobilization, Cryotherapy, Moist heat, Compression bandaging, scar mobilization, Splintting, Taping, Traction, Ultrasound, Ionotophoresis '4mg'$ /ml Dexamethasone, and Manual therapy   PLAN FOR NEXT SESSION: Progress right hip mobility and strength as able; try to avoid increasing pain   9:03 AM, 08/04/22 Mearl Latin PT, DPT Physical Therapist at [facility]

## 2022-08-05 ENCOUNTER — Encounter: Payer: Self-pay | Admitting: Radiology

## 2022-08-06 ENCOUNTER — Encounter (HOSPITAL_COMMUNITY): Payer: BC Managed Care – PPO

## 2022-08-06 ENCOUNTER — Telehealth (HOSPITAL_COMMUNITY): Payer: Self-pay

## 2022-08-06 NOTE — Telephone Encounter (Signed)
NS #1; Spoke with patient on the phone; she states she simply forgot about her appointment.  Reminded her of her next appointment on Monday 3/4 and she verbalizes she plans to attend.    8:43 AM, 08/06/22 Neville Pauls Small Ambyr Qadri MPT [place] [facility] [phone]

## 2022-08-09 ENCOUNTER — Ambulatory Visit (HOSPITAL_COMMUNITY): Payer: BC Managed Care – PPO | Attending: Orthopaedic Surgery

## 2022-08-09 DIAGNOSIS — M6281 Muscle weakness (generalized): Secondary | ICD-10-CM | POA: Diagnosis present

## 2022-08-09 DIAGNOSIS — M25551 Pain in right hip: Secondary | ICD-10-CM | POA: Insufficient documentation

## 2022-08-09 DIAGNOSIS — R262 Difficulty in walking, not elsewhere classified: Secondary | ICD-10-CM | POA: Diagnosis present

## 2022-08-09 NOTE — Therapy (Signed)
OUTPATIENT PHYSICAL THERAPY TREATMENT   Patient Name: Linda Pollard MRN: [id number] DOB:July 26, 1990, 32 y.o., female Today's Date: 08/09/2022  END OF SESSION:  PT End of Session - 08/09/22 0734     Visit Number 6    Number of Visits 8    Date for PT Re-Evaluation 08/16/22    Authorization Type [employer] PPO    Authorization Time Period 30 visit limit; no auth; 70.00 copay    Authorization - Number of Visits 30    PT Start Time 0732    PT Stop Time 0810    PT Time Calculation (min) 38 min    Activity Tolerance Patient tolerated treatment well    Behavior During Therapy WFL for tasks assessed/performed             Past Medical History:  Diagnosis Date   ADHD    Anxiety    Gallstone    Nexplanon insertion 01/01/2014   nexplanon inserted 01/01/14 left arm remove 01/01/17   Pregnancy with one fetus    Past Surgical History:  Procedure Laterality Date   CHOLECYSTECTOMY  12/03/2011   Procedure: LAPAROSCOPIC CHOLECYSTECTOMY;  Surgeon: Jamesetta So, MD;  Location: [facility];  Service: General;  Laterality: N/A;   NO PAST SURGERIES     Patient Active Problem List   Diagnosis Date Noted   Depression 09/22/2021   Urinary incontinence 09/22/2021    PCP: [facility]  REFERRING PROVIDER:  Vanetta Mulders, MD [facility]    REFERRING DIAG: M25.551 (ICD-10-CM) - Pain in right hip  THERAPY DIAG:  Pain in right hip  Difficulty in walking, not elsewhere classified  Rationale for Evaluation and Treatment: Rehabilitation  ONSET DATE: 2013 when pregnant with her daughter; went away and then returned 2015 with pregnancy of her son  SUBJECTIVE:   SUBJECTIVE STATEMENT: Patient feels like hip is stiff but not painful; feels like it is going to "lock up" again; but able to walk without pain PERTINENT HISTORY: No other orthopedic issues PAIN:  Are you having pain? Yes: NPRS scale: 3/10 Pain location: right hip Pain  description: sharp occasionally,  Aggravating factors: sudden movement Relieving factors: rest  PRECAUTIONS: None  WEIGHT BEARING RESTRICTIONS: No  FALLS:  Has patient fallen in last 6 months? No   OCCUPATION: work at for the [employer]  PLOF: Independent  PATIENT GOALS: get my hip stronger  NEXT MD VISIT: March 20th, 2024  OBJECTIVE:   DIAGNOSTIC FINDINGS:  X-ray: Impression: Negative lumbar spine x-ray  X-ray:  Impression: [facility] pelvis demonstrating mild dysplasia of the right hip  MRI: IMPRESSION: 1. 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 2. 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 3. No findings of dysplasia involving the left hip.  PATIENT SURVEYS:  FOTO 72  COGNITION: Overall cognitive status: Within functional limits for tasks assessed     SENSATION: WFL  EDEMA:  None noted   PALPATION: Tender bilateral gr troch.  LOWER EXTREMITY ROM:  Active ROM Right eval Left eval  Hip flexion    Hip extension    Hip abduction    Hip adduction    Hip internal rotation 30 38  Hip external rotation 18* 52  Knee flexion    Knee extension    Ankle dorsiflexion    Ankle plantarflexion    Ankle inversion    Ankle eversion     (Blank rows = not tested)  LOWER EXTREMITY  MMT:  MMT Right eval Left eval  Hip flexion 3+ 5  Hip extension 4 4+  Hip abduction 4- 4+  Hip adduction    Hip internal rotation    Hip external rotation    Knee flexion 4 5  Knee extension 4+ 5  Ankle dorsiflexion 5 5  Ankle plantarflexion    Ankle inversion    Ankle eversion     (Blank rows = not tested)   FUNCTIONAL TESTS:  5 times sit to stand: 12.08 sec  GAIT: Distance walked: 50 ft Assistive device utilized: None Level of assistance: Complete Independence Comments: slight slower gait speed   TODAY'S TREATMENT:                                                                                                                               DATE:  08/09/22 Recumbent Bike seat 5 x 5' dynamic warm up  Standing: Hip flexor stretch 20" x 5  Prone: Hip flexion stretch with strap 5 x 20" PT assisted contract relax 5 x with 10" push Educated in use of tennis ball and foam roller for self massage  Supine: Thomas stretch with PT assist for overpressure 3 x 20"  Kneeling hip flexion stretch 5 x 20"; right knee on foam pad  Cybex hamstring curls 30" bilateral concentric and right only eccentric 2 x 10       08/04/22 Supine piriformis stretch 3 x 20 second holds RLE Supine figure 4 stretch 3 x 20 second holds RLE Manual STM and ischemic pressure to R hip flexor  Standing hip flexor stretch 5 x 20 second holds Supine hip flexor isometric 90-90 2x 10 5 second holds Manual hip flexor eccentric 90-90 into progressive extension 5 x 10 second holds Manual supine MET R hip extension L hip flexion 10 x 5 second holds  07/29/22 Half kneeling hip flexor stretch with PPT 2 x 20 second holds  Kneeling hurting knees so completed last 2 in standing on stairs 2X20" Prone quad stretch 3 x 20 second holds RLE Quadruped donkey kicks 2 x 10 bilateral  Quadruped fire hydrants 2 x 10 bilateral  Sidelying hip  abduction 2x 10  Standing hip adductor stretch with overhead reach 3 x 20 second holds Dead bug isometric with green ball 2x 10 with 5 second holds Bridge with feet on green ball 2X10 5" holds Supine hip flexor isometric 90-90 2x 10 5 second holds Seated piriformis stretch 1 minute each side  07/26/22 Half kneeling hip flexor stretch with PPT 5 x 20 second holds Prone quad stretch 3 x 20 second holds RLE Quadruped donkey kicks 2 x 10 bilateral  Quadruped fire hydrants 2 x 10 bilateral  Standing hip adductor stretch with overhead reach 3 x 20 second holds Dead bug isometric with green ball 2x 10 with 5 second holds Supine hip flexor isometric 90-90 1x 10 5 second  holds Half kneeling hip flexor stretch with PPT  3 x 20 second holds  07/21/22 Bridge 2 x 10  Supine piriformis stretch 3 x 20 second holds Supine piriformis stretch 3 x 20 second holds Supine active hamstring stretch 3 x 20 second holds Supine alternating march RTB at knees 2 x 10  Hooklying hip abduction isometrics alternated with adduction isometrics with belt and ball 5 x 10 second holds Prone hip extension 2 x 10 bilateral Sidelying hip  abduction 2x 10  Half kneeling hip flexor stretch with PPT 3 x 20 second holds Seated hip IR RTB at ankles 10 x 3 second holds  07/16/2022 physical therapy evaluation and HEP intruction    PATIENT EDUCATION:  08/09/22 prone hip flexion stretch with strap Education details: 08/04/22: more frequent HEP performance 07/21/22: HEP.  EVAL: Patient educated on exam findings, POC, scope of PT, HEP, and what to expect next visit. Person educated: Patient Education method: Explanation, Demonstration, and Handouts Education comprehension: verbalized understanding, returned demonstration, verbal cues required, and tactile cues required   HOME EXERCISE PROGRAM: Access Code: [email or web address] URL: [email or web address] 08/04/22 - Hooklying Isometric Hip Flexion with Opposite Arm  - 2 x daily - 7 x weekly - 1-2 sets - 10 reps - 5 hold  07/26/22 - Quadruped Bent Leg Hip Extension (Mirrored)  - 1 x daily - 7 x weekly - 2 sets - 10 reps - Quadruped Fire Hydrant  - 1 x daily - 7 x weekly - 2 sets - 10 reps  07/21/22- Half Kneeling Hip Flexor Stretch  - 3 x daily - 7 x weekly - 3-5 reps - 20 second hold  Date: 07/19/2022 - Supine Bridge  - 2 x daily - 7 x weekly - 1 sets - 10 reps - 5 sec hold - Sidelying Hip Abduction  - 2 x daily - 7 x weekly - 1 sets - 10 reps - Prone Hip Extension  - 2 x daily - 7 x weekly - 1 sets - 10 reps - Supine March with Resistance Band  - 2 x daily - 7 x weekly - 1 sets - 10 reps  ASSESSMENT:  CLINICAL IMPRESSION: Today's session  continued to focus on stretching right hip flexors; strengthening the right hip.  Of note she is still having trouble with lifting right hip flexion and abduction; like with getting her right leg into and out of the car. Continued tightness right hip flexors; tenderness with palpation.  Educated patient on use of tennis ball and foam roller to self massage right hip flexors although this is difficult for her to simulate and does not feel she is getting enough depth.  Updated HEP with prone hip flexion stretch.   Patient will continue to benefit from physical therapy in order to improve function and reduce impairment.    OBJECTIVE IMPAIRMENTS: Abnormal gait, decreased activity tolerance, decreased knowledge of condition, decreased mobility, difficulty walking, decreased ROM, decreased strength, hypomobility, increased fascial restrictions, impaired perceived functional ability, impaired flexibility, and pain.   ACTIVITY LIMITATIONS: lifting, bending, sitting, standing, squatting, sleeping, stairs, transfers, bed mobility, and caring for others  PARTICIPATION LIMITATIONS: meal prep, cleaning, laundry, shopping, community activity, occupation, and yard work     [employer] POTENTIAL: Good  CLINICAL DECISION MAKING: Stable/uncomplicated  EVALUATION COMPLEXITY: Low   GOALS: Goals reviewed with patient? No  SHORT TERM GOALS: Target date: 08/02/2022 patient will be independent with initial HEP Baseline: Goal status: INITIAL  2.  Patient will self report 30% improvement to improve tolerance for functional activity  Baseline:  Goal  status: INITIAL   LONG TERM GOALS: Target date: 08/16/2022  Patient will be independent in self management strategies to improve quality of life and functional outcomes.  Baseline:  Goal status: INITIAL  2.  Patient will self report 50% improvement to improve tolerance for functional activity  Baseline:  Goal status: INITIAL  3.  Patient with increase right  hip external rotation by 20 degrees (38 degrees) to improve ability to don her shoe on her right side Baseline: 15 Goal status: INITIAL  4.  Patient will increase right leg MMTs to 5/5 without pain to promote return to ambulation community distances with minimal deviation.    Baseline: see above Goal status: INITIAL  5.  Patient will improve FOTO score to predicted value    Baseline: 72 Goal status: INITIAL   PLAN:  PT FREQUENCY: 2x/week  PT DURATION: 4 weeks  PLANNED INTERVENTIONS: Therapeutic exercises, Therapeutic activity, Neuromuscular re-education, Balance training, Gait training, Patient/Family education, Joint manipulation, Joint mobilization, Stair training, Orthotic/Fit training, DME instructions, Aquatic Therapy, Dry Needling, Electrical stimulation, Spinal manipulation, Spinal mobilization, Cryotherapy, Moist heat, Compression bandaging, scar mobilization, Splintting, Taping, Traction, Ultrasound, Ionotophoresis '4mg'$ /ml Dexamethasone, and Manual therapy   PLAN FOR NEXT SESSION: reassess next visit  8:16 AM, 08/09/22 Mearl Latin PT, DPT Physical Therapist at [facility]

## 2022-08-12 ENCOUNTER — Ambulatory Visit (HOSPITAL_COMMUNITY): Payer: BC Managed Care – PPO

## 2022-08-12 DIAGNOSIS — M6281 Muscle weakness (generalized): Secondary | ICD-10-CM

## 2022-08-12 DIAGNOSIS — R262 Difficulty in walking, not elsewhere classified: Secondary | ICD-10-CM

## 2022-08-12 DIAGNOSIS — M25551 Pain in right hip: Secondary | ICD-10-CM

## 2022-08-12 NOTE — Therapy (Signed)
OUTPATIENT PHYSICAL THERAPY TREATMENT/ PROGRESS NOTE/ DISCHARGE  NOTE  PHYSICAL THERAPY DISCHARGE SUMMARY  Visits from Start of Care: 7  Current functional level related to goals / functional outcomes: See below   Remaining deficits: See below   Education / Equipment: See below   Patient agrees to discharge. Patient goals were partially met. Patient is being discharged due to maximized rehab potential.      Patient Name: Linda Pollard MRN: [id number] DOB:03-22-1991, 32 y.o., female Today's Date: 08/12/2022  END OF SESSION:  PT End of Session - 08/12/22 0732     Visit Number 7    Number of Visits 8    Date for PT Re-Evaluation 08/16/22    Authorization Type [employer] Comm PPO    Authorization Time Period 30 visit limit; no auth; 70.00 copay    Authorization - Visit Number 6    Authorization - Number of Visits 30    PT Start Time 0731    PT Stop Time 0810    PT Time Calculation (min) 39 min    Activity Tolerance Patient tolerated treatment well    Behavior During Therapy [facility] for tasks assessed/performed             Past Medical History:  Diagnosis Date   ADHD    Anxiety    Gallstone    Nexplanon insertion 01/01/2014   nexplanon inserted 01/01/14 left arm remove 01/01/17   Pregnancy with one fetus    Past Surgical History:  Procedure Laterality Date   CHOLECYSTECTOMY  12/03/2011   Procedure: LAPAROSCOPIC CHOLECYSTECTOMY;  Surgeon: Jamesetta So, MD;  Location: [facility];  Service: General;  Laterality: N/A;   NO PAST SURGERIES     Patient Active Problem List   Diagnosis Date Noted   Depression 09/22/2021   Urinary incontinence 09/22/2021    PCP: [facility]  REFERRING PROVIDER:  Vanetta Mulders, MD [facility]    REFERRING DIAG: M25.551 (ICD-10-CM) - Pain in right hip  THERAPY DIAG:  Pain in right hip  Difficulty in walking, not elsewhere classified  Muscle weakness (generalized)  Rationale  for Evaluation and Treatment: Rehabilitation  ONSET DATE: 2013 when pregnant with her daughter; went away and then returned 2015 with pregnancy of her son  SUBJECTIVE:   SUBJECTIVE STATEMENT: Pain is about the same but " I feel stronger"; "40%" stronger PERTINENT HISTORY: No other orthopedic issues PAIN:  Are you having pain? Yes: NPRS scale: 3/10 Pain location: right hip Pain description: sharp occasionally,  Aggravating factors: sudden movement Relieving factors: rest  PRECAUTIONS: None  WEIGHT BEARING RESTRICTIONS: No  FALLS:  Has patient fallen in last 6 months? No   OCCUPATION: work at for the [employer]  PLOF: Independent  PATIENT GOALS: get my hip stronger  NEXT MD VISIT: March 20th, 2024  OBJECTIVE:   DIAGNOSTIC FINDINGS:  X-ray: Impression: Negative lumbar spine x-ray  X-ray:  Impression: [facility] pelvis demonstrating mild dysplasia of the right hip  MRI: IMPRESSION: 1. 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 2. 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 3. No findings of dysplasia involving the left hip.  PATIENT SURVEYS:  FOTO 72  COGNITION: Overall cognitive status: Within functional limits for tasks assessed     SENSATION: WFL  EDEMA:  None noted   PALPATION: Tender bilateral gr troch.  LOWER EXTREMITY ROM:  Active ROM Right eval Left eval Right 08/12/22  Hip flexion  Hip extension     Hip abduction     Hip adduction     Hip internal rotation 30 38 38  Hip external rotation 18* 52 20*  Knee flexion     Knee extension     Ankle dorsiflexion     Ankle plantarflexion     Ankle inversion     Ankle eversion      (Blank rows = not tested)  LOWER EXTREMITY MMT:  MMT Right eval Left eval Right 08/12/22 Left 08/12/22  Hip flexion 3+ 5 4-* 5  Hip extension 4 4+ 4 4+  Hip abduction 4- 4+ 4* 4+  Hip adduction      Hip internal rotation       Hip external rotation      Knee flexion 4 5 4+ 5  Knee extension 4+ '5 5 5  '$ Ankle dorsiflexion '5 5 5 5  '$ Ankle plantarflexion      Ankle inversion      Ankle eversion       (Blank rows = not tested)   FUNCTIONAL TESTS:  5 times sit to stand: 12.08 sec  GAIT: Distance walked: 50 ft Assistive device utilized: None Level of assistance: Complete Independence Comments: slight slower gait speed   TODAY'S TREATMENT:                                                                                                                              DATE:  08/12/22 Progress note FOTO 59 5 x sit to stand 11.75 sec AROM and MMTs see above Review of HEP  Recumbent bike seat 5 x 5'  Supine: Isometric hip flexion 10 x 5" hold Half kneeling  right hip flexion stretch 10 x 10"     08/09/22 Recumbent Bike seat 5 x 5' dynamic warm up  Standing: Hip flexor stretch 20" x 5  Prone: Hip flexion stretch with strap 5 x 20" PT assisted contract relax 5 x with 10" push Educated in use of tennis ball and foam roller for self massage  Supine: Thomas stretch with PT assist for overpressure 3 x 20"  Kneeling hip flexion stretch 5 x 20"; right knee on foam pad  Cybex hamstring curls 30" bilateral concentric and right only eccentric 2 x 10       08/04/22 Supine piriformis stretch 3 x 20 second holds RLE Supine figure 4 stretch 3 x 20 second holds RLE Manual STM and ischemic pressure to R hip flexor  Standing hip flexor stretch 5 x 20 second holds Supine hip flexor isometric 90-90 2x 10 5 second holds Manual hip flexor eccentric 90-90 into progressive extension 5 x 10 second holds Manual supine MET R hip extension L hip flexion 10 x 5 second holds  07/29/22 Half kneeling hip flexor stretch with PPT 2 x 20 second holds  Kneeling hurting knees so completed last 2 in standing on stairs 2X20" Prone quad stretch 3 x 20 second holds  RLE Quadruped donkey kicks 2 x 10 bilateral  Quadruped fire  hydrants 2 x 10 bilateral  Sidelying hip  abduction 2x 10  Standing hip adductor stretch with overhead reach 3 x 20 second holds Dead bug isometric with green ball 2x 10 with 5 second holds Bridge with feet on green ball 2X10 5" holds Supine hip flexor isometric 90-90 2x 10 5 second holds Seated piriformis stretch 1 minute each side  07/26/22 Half kneeling hip flexor stretch with PPT 5 x 20 second holds Prone quad stretch 3 x 20 second holds RLE Quadruped donkey kicks 2 x 10 bilateral  Quadruped fire hydrants 2 x 10 bilateral  Standing hip adductor stretch with overhead reach 3 x 20 second holds Dead bug isometric with green ball 2x 10 with 5 second holds Supine hip flexor isometric 90-90 1x 10 5 second holds Half kneeling hip flexor stretch with PPT 3 x 20 second holds  07/21/22 Bridge 2 x 10  Supine piriformis stretch 3 x 20 second holds Supine piriformis stretch 3 x 20 second holds Supine active hamstring stretch 3 x 20 second holds Supine alternating march RTB at knees 2 x 10  Hooklying hip abduction isometrics alternated with adduction isometrics with belt and ball 5 x 10 second holds Prone hip extension 2 x 10 bilateral Sidelying hip  abduction 2x 10  Half kneeling hip flexor stretch with PPT 3 x 20 second holds Seated hip IR RTB at ankles 10 x 3 second holds  07/16/2022 physical therapy evaluation and HEP intruction    PATIENT EDUCATION:  08/09/22 prone hip flexion stretch with strap Education details: 08/04/22: more frequent HEP performance 07/21/22: HEP.  EVAL: Patient educated on exam findings, POC, scope of PT, HEP, and what to expect next visit. Person educated: Patient Education method: Explanation, Demonstration, and Handouts Education comprehension: verbalized understanding, returned demonstration, verbal cues required, and tactile cues required   HOME EXERCISE PROGRAM: Access Code: [email or web address] URL: [email or web address] 08/04/22 - Hooklying Isometric Hip  Flexion with Opposite Arm  - 2 x daily - 7 x weekly - 1-2 sets - 10 reps - 5 hold  07/26/22 - Quadruped Bent Leg Hip Extension (Mirrored)  - 1 x daily - 7 x weekly - 2 sets - 10 reps - Quadruped Fire Hydrant  - 1 x daily - 7 x weekly - 2 sets - 10 reps  07/21/22- Half Kneeling Hip Flexor Stretch  - 3 x daily - 7 x weekly - 3-5 reps - 20 second hold  Date: 07/19/2022 - Supine Bridge  - 2 x daily - 7 x weekly - 1 sets - 10 reps - 5 sec hold - Sidelying Hip Abduction  - 2 x daily - 7 x weekly - 1 sets - 10 reps - Prone Hip Extension  - 2 x daily - 7 x weekly - 1 sets - 10 reps - Supine March with Resistance Band  - 2 x daily - 7 x weekly - 1 sets - 10 reps  ASSESSMENT:  CLINICAL IMPRESSION: Progress note today; improved strength noted but still has pain right hip that limits strength and increases with resistance; mild improved mobility.  Decline in FOTO score today.  Reviewed HEP with patient today; patient is independent with HEP and patient is agreeable to discharge at this time secondary to limited progress/ reaching max rehab potential.   OBJECTIVE IMPAIRMENTS: Abnormal gait, decreased activity tolerance, decreased knowledge of condition, decreased mobility, difficulty walking, decreased ROM, decreased strength, hypomobility, increased fascial restrictions,  impaired perceived functional ability, impaired flexibility, and pain.   ACTIVITY LIMITATIONS: lifting, bending, sitting, standing, squatting, sleeping, stairs, transfers, bed mobility, and caring for others  PARTICIPATION LIMITATIONS: meal prep, cleaning, laundry, shopping, community activity, occupation, and yard work     [employer] POTENTIAL: Good  CLINICAL DECISION MAKING: Stable/uncomplicated  EVALUATION COMPLEXITY: Low   GOALS: Goals reviewed with patient? No  SHORT TERM GOALS: Target date: 08/02/2022 patient will be independent with initial HEP Baseline: Goal status: MET  2.  Patient will self report 30% improvement to  improve tolerance for functional activity  Baseline:  Goal status: MET   LONG TERM GOALS: Target date: 08/16/2022  Patient will be independent in self management strategies to improve quality of life and functional outcomes.  Baseline:  Goal status: IN PROGRESS  2.  Patient will self report 50% improvement to improve tolerance for functional activity  Baseline:  Goal status: IN PROGRESS  3.  Patient with increase right hip external rotation by 20 degrees (38 degrees) to improve ability to don her shoe on her right side Baseline: 18 Goal status: IN PROGRESS  4.  Patient will increase right leg MMTs to 5/5 without pain to promote return to ambulation community distances with minimal deviation.    Baseline: see above Goal status: IN PROGRESS  5.  Patient will improve FOTO score to predicted value    Baseline: 72 Goal status: IN PROGRESS   PLAN:  PT FREQUENCY: 2x/week  PT DURATION: 4 weeks  PLANNED INTERVENTIONS: Therapeutic exercises, Therapeutic activity, Neuromuscular re-education, Balance training, Gait training, Patient/Family education, Joint manipulation, Joint mobilization, Stair training, Orthotic/Fit training, DME instructions, Aquatic Therapy, Dry Needling, Electrical stimulation, Spinal manipulation, Spinal mobilization, Cryotherapy, Moist heat, Compression bandaging, scar mobilization, Splintting, Taping, Traction, Ultrasound, Ionotophoresis '4mg'$ /ml Dexamethasone, and Manual therapy   PLAN FOR NEXT SESSION: 08/25/22 returns to MD; discharge  8:11 AM, 08/12/22 Tanyah Debruyne Small Janaiya Beauchesne MPT [place] [facility] [place] [phone] [id number]

## 2022-08-17 ENCOUNTER — Encounter: Payer: Self-pay | Admitting: Women's Health

## 2022-08-25 ENCOUNTER — Ambulatory Visit (INDEPENDENT_AMBULATORY_CARE_PROVIDER_SITE_OTHER): Payer: BC Managed Care – PPO | Admitting: Orthopaedic Surgery

## 2022-08-25 DIAGNOSIS — M25551 Pain in right hip: Secondary | ICD-10-CM | POA: Diagnosis not present

## 2022-08-25 DIAGNOSIS — M21851 Other specified acquired deformities of right thigh: Secondary | ICD-10-CM

## 2022-08-25 NOTE — Progress Notes (Signed)
Chief Complaint: Right hip pain     History of Present Illness:   08/25/2022: Presents today for follow-up after her right hip injection.  She has been working in physical therapy diligently.  She does feel stronger although this is not changed her pain.  She is here today for further discussion.  Linda Pollard is a 32 y.o. female presents today with ongoing right hip pain and groin pain which has been persistent for the last several years.  She states that this was worsened by the birth of her second child in 60.  She has seen a chiropractor as well as as a pelvic floor therapist we have worked on dry needling and acupuncture of the right hip.  She takes ibuprofen for the pain.  She experiences the pain deep in the groin.  She has not previously had any injections in the right hip.  She denies any childhood needing of a brace for the right hip.    Surgical History:   None  PMH/PSH/Family History/Social History/Meds/Allergies:    Past Medical History:  Diagnosis Date   ADHD    Anxiety    Gallstone    Nexplanon insertion 01/01/2014   nexplanon inserted 01/01/14 left arm remove 01/01/17   Pregnancy with one fetus    Past Surgical History:  Procedure Laterality Date   CHOLECYSTECTOMY  12/03/2011   Procedure: LAPAROSCOPIC CHOLECYSTECTOMY;  Surgeon: Jamesetta So, MD;  Location: [facility];  Service: General;  Laterality: N/A;   NO PAST SURGERIES     Social History   Socioeconomic History   Marital status: Married    Spouse name: Not on file   Number of children: Not on file   Years of education: Not on file   Highest education level: Not on file  Occupational History   Not on file  Tobacco Use   Smoking status: Never   Smokeless tobacco: Never  Vaping Use   Vaping Use: Never used  Substance and Sexual Activity   Alcohol use: No   Drug use: No   Sexual activity: Yes    Birth control/protection: Condom  Other Topics Concern   Not on  file  Social History Narrative   Not on file   Social Determinants of Health   Financial Resource Strain: Low Risk  (09/22/2021)   Overall Financial Resource Strain (CARDIA)    Difficulty of Paying Living Expenses: Not hard at all  Food Insecurity: No Food Insecurity (09/22/2021)   Hunger Vital Sign    Worried About Running Out of Food in the Last Year: Never true    Ran Out of Food in the Last Year: Never true  Transportation Needs: No Transportation Needs (09/22/2021)   PRAPARE - Hydrologist (Medical): No    Lack of Transportation (Non-Medical): No  Physical Activity: Inactive (09/22/2021)   Exercise Vital Sign    Days of Exercise per Week: 0 days    Minutes of Exercise per Session: 0 min  Stress: Stress Concern Present (09/22/2021)   [place]    Feeling of Stress : To some extent  Social Connections: Unknown (09/22/2021)   Social Connection and Isolation Panel [NHANES]    Frequency of Communication with Friends and Family: Twice a week    Frequency  of Social Gatherings with Friends and Family: Once a week    Attends Religious Services: More than 4 times per year    Active Member of [employer] or Organizations: Yes    Attends Music therapist: More than 4 times per year    Marital Status: Patient declined   Family History  Problem Relation Age of Onset   Healthy Father    Healthy Mother    Anesthesia problems Neg Hx    No Known Allergies Current Outpatient Medications  Medication Sig Dispense Refill   ciclopirox (PENLAC) 8 % solution Apply topically at bedtime. (Patient not taking: Reported on 08/03/2022)     dexmethylphenidate (FOCALIN XR) 10 MG 24 hr capsule Take 10 mg by mouth daily.     hydrOXYzine (ATARAX) 25 MG tablet Take 25 mg by mouth every 8 (eight) hours as needed. (Patient not taking: Reported on 08/03/2022)     ibuprofen (ADVIL) 600 MG tablet Take 1 tablet (600  mg total) by mouth every 8 (eight) hours as needed for up to 30 doses for mild pain or moderate pain. 30 tablet 0   Vitamin D, Ergocalciferol, (DRISDOL) 1.25 MG (50000 UNIT) CAPS capsule Take 50,000 Units by mouth once a week. (Patient not taking: Reported on 08/03/2022)     No current facility-administered medications for this visit.   No results found.  Review of Systems:   A ROS was performed including pertinent positives and negatives as documented in the HPI.  Physical Exam :   Constitutional: NAD and appears stated age Neurological: Alert and oriented Psych: Appropriate affect and cooperative Last menstrual period 07/16/2022.   Comprehensive Musculoskeletal Exam:    Inspection Right Left  Skin No atrophy or gross abnormalities appreciated No atrophy or gross abnormalities appreciated  Palpation    Tenderness None None  Crepitus None None  Range of Motion    Flexion (passive) 120 120  Extension 30 30  IR 30 with pain 30  ER 50 50  Strength    Flexion  5/5 5/5  Extension 5/5 5/5  Special Tests    FABER Negative Negative  FADIR Positive Negative  ER Lag/Capsular Insufficiency Negative Negative  Instability Negative Negative  Sacroiliac pain Negative  Negative   Instability    Generalized Laxity No No  Neurologic    sciatic, femoral, obturator nerves intact to light sensation  Vascular/Lymphatic    DP pulse 2+ 2+  Lumbar Exam    Patient has symmetric lumbar range of motion with negative pain referral to hip     Imaging:   Xray (3 views right hip): Significant acetabular dysplasia with lateral center edge angle 20 degrees  MRI (right hip): Significant right hip acetabular dysplasia with again lateral center edge angle of 19 degrees  I personally reviewed and interpreted the radiographs.   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  Ultimately I did discuss that given the severity of her acetabular  dysplasia that I do believe that ultimate surgical management may require periacetabular osteotomy with hip arthroscopy and labral repair possible reconstruction.  I did discuss that given the fact this is a very large procedure that I do believe it would be wise to begin with right hip ultrasound-guided injection as well as a strong therapy program for gluteal strengthening.  I would like her to work on that her dynamic Stabilizers.  Unfortunately after working with physical therapy she states that she still  has pain as well as limited motion.  Her injection did improve her motion somewhat although this is still been minimally effective.  Side effect she is hoping to discuss further surgical intervention.  Given the fact that she may be a candidate for periacetabular osteotomy I will plan a referral to [facility] for discussion of this  Plan :    -Will to [facility] for discussion of right hip periacetabular osteotomy        I personally saw and evaluated the patient, and participated in the management and treatment plan.  Vanetta Mulders, MD Attending Physician, Orthopedic Surgery  This document was dictated using Dragon voice recognition software. A reasonable attempt at proof reading has been made to minimize errors.

## 2022-08-31 ENCOUNTER — Encounter: Payer: Self-pay | Admitting: Women's Health

## 2022-08-31 DIAGNOSIS — N926 Irregular menstruation, unspecified: Secondary | ICD-10-CM | POA: Insufficient documentation

## 2022-09-28 ENCOUNTER — Encounter: Payer: Self-pay | Admitting: Women's Health

## 2022-09-28 DIAGNOSIS — R9389 Abnormal findings on diagnostic imaging of other specified body structures: Secondary | ICD-10-CM | POA: Insufficient documentation

## 2023-12-28 ENCOUNTER — Encounter (HOSPITAL_COMMUNITY): Payer: Self-pay | Admitting: *Deleted

## 2023-12-28 ENCOUNTER — Other Ambulatory Visit: Payer: Self-pay

## 2023-12-28 ENCOUNTER — Emergency Department (HOSPITAL_COMMUNITY)
Admission: EM | Admit: 2023-12-28 | Discharge: 2023-12-29 | Disposition: A | Discharge status: Home / Self Care | Attending: Emergency Medicine | Admitting: Emergency Medicine

## 2023-12-28 DIAGNOSIS — R1033 Periumbilical pain: Secondary | ICD-10-CM

## 2023-12-28 DIAGNOSIS — K529 Noninfective gastroenteritis and colitis, unspecified: Secondary | ICD-10-CM | POA: Diagnosis not present

## 2023-12-28 DIAGNOSIS — D72829 Elevated white blood cell count, unspecified: Secondary | ICD-10-CM | POA: Diagnosis not present

## 2023-12-28 LAB — URINALYSIS, ROUTINE W REFLEX MICROSCOPIC
Bilirubin Urine: NEGATIVE
Glucose, UA: NEGATIVE mg/dL
Ketones, ur: NEGATIVE mg/dL
Nitrite: NEGATIVE
Protein, ur: NEGATIVE mg/dL
Specific Gravity, Urine: 1.019 (ref 1.005–1.030)
pH: 7 (ref 5.0–8.0)

## 2023-12-28 LAB — CBC
HCT: 40.3 % (ref 36.0–46.0)
Hemoglobin: 13.4 g/dL (ref 12.0–15.0)
MCH: 31.2 pg (ref 26.0–34.0)
MCHC: 33.3 g/dL (ref 30.0–36.0)
MCV: 93.7 fL (ref 80.0–100.0)
Platelets: 271 K/uL (ref 150–400)
RBC: 4.3 MIL/uL (ref 3.87–5.11)
RDW: 12.9 % (ref 11.5–15.5)
WBC: 11.1 K/uL — ABNORMAL HIGH (ref 4.0–10.5)
nRBC: 0 % (ref 0.0–0.2)

## 2023-12-28 LAB — COMPREHENSIVE METABOLIC PANEL WITH GFR
ALT: 100 U/L — ABNORMAL HIGH (ref 0–44)
AST: 55 U/L — ABNORMAL HIGH (ref 15–41)
Albumin: 3.7 g/dL (ref 3.5–5.0)
Alkaline Phosphatase: 66 U/L (ref 38–126)
Anion gap: 7 (ref 5–15)
BUN: 8 mg/dL (ref 6–20)
CO2: 28 mmol/L (ref 22–32)
Calcium: 8.7 mg/dL — ABNORMAL LOW (ref 8.9–10.3)
Chloride: 107 mmol/L (ref 98–111)
Creatinine, Ser: 0.74 mg/dL (ref 0.44–1.00)
GFR, Estimated: >60 mL/min (ref >60)
Glucose, Bld: 105 mg/dL — ABNORMAL HIGH (ref 70–99)
Potassium: 3.5 mmol/L (ref 3.5–5.1)
Sodium: 142 mmol/L (ref 135–145)
Total Bilirubin: 0.4 mg/dL (ref 0.0–1.2)
Total Protein: 6.7 g/dL (ref 6.5–8.1)

## 2023-12-28 LAB — HCG, SERUM, QUALITATIVE: Preg, Serum: NEGATIVE

## 2023-12-28 LAB — LIPASE, BLOOD: Lipase: 29 U/L (ref 11–51)

## 2023-12-28 NOTE — ED Triage Notes (Signed)
Patient c/o nausea and abdominal pain

## 2023-12-29 ENCOUNTER — Emergency Department (HOSPITAL_COMMUNITY)

## 2023-12-29 ENCOUNTER — Encounter (INDEPENDENT_AMBULATORY_CARE_PROVIDER_SITE_OTHER): Payer: Self-pay | Admitting: *Deleted

## 2023-12-29 MED ORDER — PANTOPRAZOLE SODIUM 20 MG PO TBEC: 20.0000 mg | DELAYED_RELEASE_TABLET | Freq: Every day | ORAL | 0 refills | Status: DC

## 2023-12-29 MED ORDER — ONDANSETRON HCL 4 MG/2ML IJ SOLN
4.0000 mg | Freq: Once | INTRAMUSCULAR | Status: AC
  Administered 2023-12-29: 4 mg via INTRAVENOUS
  Filled 2023-12-29: qty 2

## 2023-12-29 MED ORDER — ONDANSETRON 4 MG PO TBDP: 4.0000 mg | ORAL_TABLET | Freq: Three times a day (TID) | ORAL | 0 refills | Status: DC | PRN

## 2023-12-29 MED ORDER — KETOROLAC TROMETHAMINE 15 MG/ML IJ SOLN
15.0000 mg | Freq: Once | INTRAMUSCULAR | Status: AC
  Administered 2023-12-29: 15 mg via INTRAVENOUS
  Filled 2023-12-29: qty 1

## 2023-12-29 MED ORDER — IOHEXOL 350 MG/ML SOLN
75.0000 mL | Freq: Once | INTRAVENOUS | Status: AC | PRN
  Administered 2023-12-29: 75 mL via INTRAVENOUS

## 2023-12-29 MED ORDER — AMOXICILLIN-POT CLAVULANATE 875-125 MG PO TABS: 1.0000 | ORAL_TABLET | Freq: Two times a day (BID) | ORAL | 0 refills | Status: DC

## 2023-12-29 NOTE — ED Provider Notes (Signed)
 [facility] EMERGENCY DEPARTMENT AT [facility] Provider Note   CSN: [id number] Arrival date & time: 12/28/23  2117     Patient presents with: Abdominal Pain   Linda Pollard is a 33 y.o. female reports otherwise healthy presents emerged from today for evaluation of 3 weeks of periumbilical/epigastric cramping sensation.  Reports nausea without vomiting.  Does feel that she is constipated.  No diarrhea, dysuria, hematuria, melena, or fever.  Reports she had 1 episode of some bright red blood per rectum but has had normal bowel movements without blood since.  She went to her primary care doctor the other day and was prescribed Bentyl which she felt helped initially but now does not.  She reports she thinks that she is to be getting a GI referral however is unsure.  Cholecystectomy in 2013.  Allergic to penicillin.  Denies any tobacco, EtOH illicit drug use.  Abdominal Pain Associated symptoms: constipation and nausea   Associated symptoms: no chest pain, no chills, no diarrhea, no fever, no hematuria, no shortness of breath, no vaginal bleeding, no vaginal discharge and no vomiting        Prior to Admission medications   Medication Sig Start Date End Date Taking? Authorizing Provider  amoxicillin -clavulanate (AUGMENTIN ) 875-125 MG tablet Take 1 tablet by mouth every 12 (twelve) hours. 12/29/23  Yes Bernis Ernst, PA-C  ondansetron  (ZOFRAN -ODT) 4 MG disintegrating tablet Take 1 tablet (4 mg total) by mouth every 8 (eight) hours as needed for nausea or vomiting. 12/29/23  Yes Bernis Ernst, PA-C  pantoprazole  (PROTONIX ) 20 MG tablet Take 1 tablet (20 mg total) by mouth daily. 12/29/23  Yes Bernis Ernst, PA-C  ciclopirox (PENLAC) 8 % solution Apply topically at bedtime. Patient not taking: Reported on 08/03/2022 07/21/21   [provider]  dexmethylphenidate (FOCALIN XR) 10 MG 24 hr capsule Take 10 mg by mouth daily. 06/18/22   [provider]  hydrOXYzine (ATARAX) 25  MG tablet Take 25 mg by mouth every 8 (eight) hours as needed. Patient not taking: Reported on 08/03/2022 03/31/21   [provider]  ibuprofen  (ADVIL ) 600 MG tablet Take 1 tablet (600 mg total) by mouth every 8 (eight) hours as needed for up to 30 doses for mild pain or moderate pain. 01/13/22   Cottie Donnice PARAS, MD  Vitamin D, Ergocalciferol, (DRISDOL) 1.25 MG (50000 UNIT) CAPS capsule Take 50,000 Units by mouth once a week. Patient not taking: Reported on 08/03/2022 08/31/21   [provider]  fluticasone  (FLONASE ) 50 MCG/ACT nasal spray Place 2 sprays into both nostrils daily. 08/09/17 06/25/19  Fawze, Mina A, PA-C  sucralfate  (CARAFATE ) 1 GM/10ML suspension Take 10 mLs (1 g total) by mouth 4 (four) times daily -  with meals and at bedtime. 05/27/17 06/25/19  Garrick Charleston, MD    Allergies: Patient has no known allergies.    Review of Systems  Constitutional:  Negative for chills and fever.  Respiratory:  Negative for shortness of breath.   Cardiovascular:  Negative for chest pain.  Gastrointestinal:  Positive for abdominal pain, constipation and nausea. Negative for diarrhea and vomiting.  Genitourinary:  Negative for dyspareunia, hematuria, vaginal bleeding and vaginal discharge.    Updated Vital Signs BP 114/64   Pulse 78   Temp 98.8 F (37.1 C)   Resp 16   Ht 4' 11 (1.499 m)   Wt 86.1 kg   LMP 12/28/2023   SpO2 100%   BMI 38.34 kg/m   Physical Exam Vitals and nursing  note reviewed.  Constitutional:      General: She is not in acute distress.    Appearance: She is not ill-appearing or toxic-appearing.  Eyes:     General: No scleral icterus. Cardiovascular:     Rate and Rhythm: Normal rate.  Pulmonary:     Effort: Pulmonary effort is normal. No respiratory distress.  Abdominal:     General: Bowel sounds are normal. There is no distension.     Palpations: Abdomen is soft.     Tenderness: There is generalized abdominal tenderness. There is no guarding  or rebound.  Genitourinary:    Comments: Patient declines rectal examination. Risks discussed with her however she still declines. Skin:    General: Skin is warm and dry.  Neurological:     Mental Status: She is alert.     (all labs ordered are listed, but only abnormal results are displayed) Labs Reviewed  COMPREHENSIVE METABOLIC PANEL WITH GFR - Abnormal; Notable for the following components:      Result Value   Glucose, Bld 105 (*)    Calcium 8.7 (*)    AST 55 (*)    ALT 100 (*)    All other components within normal limits  CBC - Abnormal; Notable for the following components:   WBC 11.1 (*)    All other components within normal limits  URINALYSIS, ROUTINE W REFLEX MICROSCOPIC - Abnormal; Notable for the following components:   APPearance CLOUDY (*)    Hgb urine dipstick MODERATE (*)    Leukocytes,Ua SMALL (*)    Bacteria, UA RARE (*)    All other components within normal limits  URINE CULTURE  LIPASE, BLOOD  HCG, SERUM, QUALITATIVE    EKG: None  Radiology: CT ABDOMEN PELVIS W CONTRAST Result Date: 12/29/2023 CLINICAL DATA:  Nausea and abdominal pain. EXAM: CT ABDOMEN AND PELVIS WITH CONTRAST TECHNIQUE: Multidetector CT imaging of the abdomen and pelvis was performed using the standard protocol following bolus administration of intravenous contrast. RADIATION DOSE REDUCTION: This exam was performed according to the departmental dose-optimization program which includes automated exposure control, adjustment of the mA and/or kV according to patient size and/or use of iterative reconstruction technique. CONTRAST:  75mL OMNIPAQUE  IOHEXOL  350 MG/ML SOLN COMPARISON:  None Available. FINDINGS: Lower chest: No acute findings. Hepatobiliary: No suspicious focal abnormality within the liver parenchyma. Gallbladder is surgically absent. No intrahepatic or extrahepatic biliary dilation. Pancreas: No focal mass lesion. No dilatation of the main duct. No intraparenchymal cyst. No  peripancreatic edema. Spleen: No splenomegaly. No suspicious focal mass lesion. Adrenals/Urinary Tract: No adrenal nodule or mass. Kidneys unremarkable. No evidence for hydroureter. The urinary bladder appears normal for the degree of distention. Stomach/Bowel: Stomach is unremarkable. No gastric wall thickening. No evidence of outlet obstruction. Duodenum is normally positioned as is the ligament of Treitz. No small bowel wall thickening. No small bowel dilatation. The terminal ileum is normal. The appendix is normal. Colon is incompletely distended which accentuates wall thickness in some areas. Component of true mural thickening in the descending colon is suspected although not definitely pathologic. There is no substantial pericolonic edema or inflammation. Vascular/Lymphatic: No abdominal aortic aneurysm. No abdominal aortic atherosclerotic calcification. There is no gastrohepatic or hepatoduodenal ligament lymphadenopathy. No retroperitoneal or mesenteric lymphadenopathy. No pelvic sidewall lymphadenopathy. Reproductive: Unremarkable. Other: No intraperitoneal free fluid. Musculoskeletal: No worrisome lytic or sclerotic osseous abnormality. IMPRESSION: 1. Colon is incompletely distended which accentuates wall thickness in some areas. Component of true mural thickening in the descending colon is  suspected although not definite. There is no substantial pericolonic edema or inflammation. Imaging features are nonspecific but could be related to a subtle colitis. 2. Otherwise unremarkable CT abdomen/pelvis. Electronically Signed   By: Camellia Candle M.D.   On: 12/29/2023 06:08   Procedures   Medications Ordered in the ED  iohexol  (OMNIPAQUE ) 350 MG/ML injection 75 mL (75 mLs Intravenous Contrast Given 12/29/23 0458)    Medical Decision Making Amount and/or Complexity of Data Reviewed Labs: ordered. Radiology: ordered.  Risk Prescription drug management.   33 y.o. female presents to the ER for  evaluation of abdominal pain. Differential diagnosis includes but is not limited to AAA, mesenteric ischemia, appendicitis, diverticulitis, DKA, gastroenteritis, nephrolithiasis, pancreatitis, constipation, UTI, bowel obstruction, biliary disease, IBD, PUD, hepatitis. Vital signs unremarkable. Physical exam as noted above.   Patient does not appear in any acute distress.  Pains are going on for 3 weeks.  Is already seen primary care for this.  Has not followed up with GI for this yet.  She reports 1 episode of bright red blood per rectum however has not had any since and has had normal bowel movements.  She is declining examination.  Will obtain CT scan and labs.  I independently reviewed and interpreted the patient's labs.  Precedex negative.  Urinalysis is cloudy urine with moderate hemoglobin of small leukocytes with 6-10 red blood cells.  Patient reports she is currently on her menstrual cycle.  CBC with white blood cell count 11.1, no anemia.  CMP is glucose 105, calcium 8.7.  Mildly elevated AST and ALT at 55 and 100 respectively however do not have any significant recent labs to compare this to.  No other electrolyte or LFT abnormality.  Lipase within normal limits.  CT Abd Pelvis 1. Colon is incompletely distended which accentuates wall thickness in some areas. Component of true mural thickening in the descending colon is suspected although not definite. There is no substantial pericolonic edema or inflammation. Imaging features are nonspecific but could be related to a subtle colitis. 2. Otherwise unremarkable CT abdomen/pelvis. Per radiologist's interpretation.    I do not feel patient needs any pelvic ultrasound as her pain is mainly in the epigastric region.  I do not think she needs a pelvic examination as well, in addition she also declined it.  I given her some Toradol  and Zofran  to help with her symptoms.  Given her duration of symptoms as well as the imaging suggesting some colitis, will  place her on some Augmentin .  I have also prescribed her some pantoprazole  and some Zofran .  I have put in an ambulatory referral for [facility] given that she lives in the area.  I have also given her their mission for them as well.  She is hemodynamically stable, does not appear in any acute distress.  Abdomen does not show any peritoneal signs.  Patient may have IBD/IBS, will need further nonemergent workup with gastroenterology.  Stable for discharge.  We discussed the results of the labs/imaging. The plan is for of care, follow-up with GI. We discussed strict return precautions and red flag symptoms. The patient verbalized their understanding and agrees to the plan. The patient is stable and being discharged home in good condition.  Portions of this report may have been transcribed using voice recognition software. Every effort was made to ensure accuracy; however, inadvertent computerized transcription errors may be present.    Final diagnoses:  Periumbilical abdominal pain  Colitis    ED Discharge Orders  Ordered    Ambulatory referral to Gastroenterology  Status:  Canceled        12/29/23 0627    Ambulatory referral to Gastroenterology        12/29/23 0629    amoxicillin -clavulanate (AUGMENTIN ) 875-125 MG tablet  Every 12 hours        12/29/23 0630    ondansetron  (ZOFRAN -ODT) 4 MG disintegrating tablet  Every 8 hours PRN        12/29/23 0630    pantoprazole  (PROTONIX ) 20 MG tablet  Daily        12/29/23 0630               Bernis Ernst, PA-C 12/30/23 [id number]    Haze Lonni PARAS, MD 01/03/24 [phone]

## 2023-12-29 NOTE — Discharge Instructions (Addendum)
 You were seen in your ER today for evaluation of your abdominal pain. Your CT scan shows colitis. Given your symptoms presentation, you will need to follow up with a GI provider.  I have sent in the referral for one however they do not call you within the next few days, please call to schedule an appointment.  I am sending you in medications.  One of the medication is called Augmentin  which you will take twice a day for the next week.  Additionally, sending you in Zofran  which is an antinausea medication that you can take as needed.  I am also prescribing something called pantoprazole  which you will take daily.  You can still take the medication that your primary care provider gave you to help with the spasming pain.  I have included additional information in this discharge report for you to review.  If you have any concerns, new or worsening symptoms, please return to your nearest emergency department for reevaluation.  Contact a doctor if: Your belly pain changes or gets worse. You have very bad cramping or bloating in your belly. You vomit. Your pain gets worse with meals, after eating, or with certain foods. You have trouble pooping or have watery poop for more than 2-3 days. You are not hungry, or you lose weight without trying. You have signs of not getting enough fluid or water  (dehydration). These may include: Dark pee, very little pee, or no pee. Cracked lips or dry mouth. Feeling sleepy or weak. You have pain when you pee or poop. Your belly pain wakes you up at night. You have blood in your pee. You have a fever. Get help right away if: You cannot stop vomiting. Your pain is only in one part of your belly, like on the right side. You have bloody or black poop, or poop that looks like tar. You have trouble breathing. You have chest pain. These symptoms may be an emergency. Get help right away. Call 911. Do not wait to see if the symptoms will go away. Do not drive yourself to the  hospital.

## 2023-12-30 LAB — URINE CULTURE: Culture: <10000 — AB

## 2024-01-09 ENCOUNTER — Ambulatory Visit (INDEPENDENT_AMBULATORY_CARE_PROVIDER_SITE_OTHER): Admitting: Gastroenterology

## 2024-01-09 ENCOUNTER — Encounter (INDEPENDENT_AMBULATORY_CARE_PROVIDER_SITE_OTHER): Payer: Self-pay | Admitting: Gastroenterology

## 2024-01-09 VITALS — BP 110/73 | HR 74 | Temp 97.5°F | Ht 59.0 in | Wt 179.9 lb

## 2024-01-09 DIAGNOSIS — K529 Noninfective gastroenteritis and colitis, unspecified: Secondary | ICD-10-CM | POA: Insufficient documentation

## 2024-01-09 DIAGNOSIS — K5900 Constipation, unspecified: Secondary | ICD-10-CM

## 2024-01-09 NOTE — Patient Instructions (Signed)
 Start Benefiber fiber supplements 1 to 2 tablespoons daily to improve constipation Slowly advance your diet, avoid spicy foods or heavy meals that may be greasy If recurrent symptoms, we may need to proceed with further investigations.

## 2024-01-09 NOTE — Progress Notes (Unsigned)
 Toribio Fortune, M.D. Gastroenterology & Hepatology [facility] Gastroenterology [address] [place], [place] [address] Primary Care Physician: Vida Mardy DEL, PA-C [address] [place] [address]  Referring MD: PCP  Chief Complaint: Colitis  History of Present Illness: Linda Pollard is a 33 y.o. female with past medical history of ADHD, anxiety, who presents for evaluation of possible colitis.  Patient reports that the first days of July she had new onset of abdominal pain in the mid abdominal area, which was intermittent, along with bowel movements with soft consistency - had more than 5 Bms per day, with some scant amount of blood but no melena. Shehad regular Bms prior to the onset of symptoms. She also endorsed having  nausea without vomiting. The patient denies having any nausea, vomiting, fever, chills, hematochezia, melena, hematemesis,  jaundice, pruritus. Believes she lost 10 lb since symptoms started. No sick contacts or recent travel.  Patient saw her PCP after symptoms started and had a stool culture on 12/23/23 whch was negative for Salmonella, Shigella, Yersinia, Campylobacter.  Patient took some Bentyl in the past for abdominal pain but has not taken it recently.  Patient went to [facility] on 12/28/20[facility].  Labs at that time showed CMP with ALT 100, AST 55, lipase 29, alkaline phosphatase 66, total bilirubin 0.4, normal electrolytes renal function, CBC with WBC 11.1, platelets 271 and hemoglobin 13.4, urine culture was negative.  CT of the abdomen pelvis with IV contrast showed incompletely distended colon accentuating wall thickness.  States that recently she has felt somewhat constipated, as she has had to strain more than in the past. Has noticed the stool is more formed than before . Has a BM 1-2 times per day. Has been slowly advancing her diet but has noticed fullness after eating. No more abdominal pain.  Last ZHI:wzczm Last  Colonoscopy:never  FHx: neg for any gastrointestinal/liver disease, no malignancies Social: neg smoking, alcohol  or illicit drug use Surgical: Cholecystectomy  Past Medical History: Past Medical History:  Diagnosis Date   ADHD    Anxiety    Gallstone    Nexplanon insertion 01/01/2014   nexplanon inserted 01/01/14 left arm remove 01/01/17   Pregnancy with one fetus     Past Surgical History: Past Surgical History:  Procedure Laterality Date   CHOLECYSTECTOMY  12/03/2011   Procedure: LAPAROSCOPIC CHOLECYSTECTOMY;  Surgeon: Oneil DELENA Budge, MD;  Location: [facility];  Service: General;  Laterality: N/A;   NO PAST SURGERIES      Family History: Family History  Problem Relation Age of Onset   Healthy Father    Healthy Mother    Anesthesia problems Neg Hx     Social History: Social History   Tobacco Use  Smoking Status Never  Smokeless Tobacco Never   Social History   Substance and Sexual Activity  Alcohol  Use No   Social History   Substance and Sexual Activity  Drug Use No    Allergies: Allergies  Allergen Reactions   Penicillins Hives    Medications: Current Outpatient Medications  Medication Sig Dispense Refill   ondansetron  (ZOFRAN -ODT) 4 MG disintegrating tablet Take 1 tablet (4 mg total) by mouth every 8 (eight) hours as needed for nausea or vomiting. 20 tablet 0   pantoprazole  (PROTONIX ) 20 MG tablet Take 1 tablet (20 mg total) by mouth daily. (Patient not taking: Reported on 01/09/2024) 30 tablet 0   No current facility-administered medications for this visit.    Review of Systems:  GENERAL: negative for malaise, night sweats HEENT: No changes in hearing or vision, no nose bleeds or other nasal problems. NECK: Negative for lumps, goiter, pain and significant neck swelling RESPIRATORY: Negative for cough, wheezing CARDIOVASCULAR: Negative for chest pain, leg swelling, palpitations, orthopnea GI: SEE HPI MUSCULOSKELETAL: Negative for joint pain or  swelling, back pain, and muscle pain. SKIN: Negative for lesions, rash PSYCH: Negative for sleep disturbance, mood disorder and recent psychosocial stressors. HEMATOLOGY Negative for prolonged bleeding, bruising easily, and swollen nodes. ENDOCRINE: Negative for cold or heat intolerance, polyuria, polydipsia and goiter. NEURO: negative for tremor, gait imbalance, syncope and seizures. The remainder of the review of systems is noncontributory.   Physical Exam: BP 110/73 (BP Location: Left Arm, Patient Position: Sitting, Cuff Size: Large)   Pulse 74   Temp (!) 97.5 F (36.4 C) (Temporal)   Ht 4' 11 (1.499 m)   Wt 179 lb 14.4 oz (81.6 kg)   LMP 12/28/2023   BMI 36.34 kg/m  GENERAL: The patient is AO x3, in no acute distress. HEENT: Head is normocephalic and atraumatic. EOMI are intact. Mouth is well hydrated and without lesions. NECK: Supple. No masses LUNGS: Clear to auscultation. No presence of rhonchi/wheezing/rales. Adequate chest expansion HEART: RRR, normal s1 and s2. ABDOMEN: Soft, nontender, no guarding, no peritoneal signs, and nondistended. BS +. No masses. RECTAL EXAM: no external lesions, normal tone, no masses, brown stool without blood.*** Chaperone: EXTREMITIES: Without any cyanosis, clubbing, rash, lesions or edema. NEUROLOGIC: AOx3, no focal motor deficit. SKIN: no jaundice, no rashes   Imaging/Labs: as above  I personally reviewed and interpreted the available labs, imaging and endoscopic files.  Impression and Plan: Linda Pollard is a 33 y.o. female with ***   All questions were answered.      Toribio Fortune, MD Gastroenterology and Hepatology [facility]

## 2024-01-25 ENCOUNTER — Telehealth: Payer: Self-pay | Admitting: Gastroenterology

## 2024-01-25 DIAGNOSIS — R7989 Other specified abnormal findings of blood chemistry: Secondary | ICD-10-CM

## 2024-01-25 NOTE — Telephone Encounter (Signed)
 I received the results of the most recent blood workup performed on 01/23/2024 which showed a CMP with AST 42, ALT 97, total bilirubin 0.6, albumin 4.3, alkaline phosphatase 58, normal kidney function electrolytes.  I discussed with the patient these findings.  Will order blood workup to rule out infectious, metabolic and autoimmune etiologies, will also liver ultrasound.  Hi Mindy/Tammy,  can you please schedule a US  of the RUQ? Dx: elevated LFTs.   Thanks,  Toribio Fortune, MD Gastroenterology and Hepatology [facility]enter[facility]

## 2024-01-26 ENCOUNTER — Other Ambulatory Visit: Payer: Self-pay | Admitting: *Deleted

## 2024-01-26 ENCOUNTER — Encounter: Payer: Self-pay | Admitting: *Deleted

## 2024-01-26 DIAGNOSIS — R7989 Other specified abnormal findings of blood chemistry: Secondary | ICD-10-CM

## 2024-01-26 NOTE — Telephone Encounter (Signed)
 Message sent to [facility] with appointment of US .

## 2024-02-01 LAB — ANA: Anti Nuclear Antibody (ANA): NEGATIVE

## 2024-02-01 LAB — CERULOPLASMIN: Ceruloplasmin: 27 mg/dL (ref 14–48)

## 2024-02-01 LAB — HEPATITIS PANEL, ACUTE
Hep A IgM: NONREACTIVE
Hep B C IgM: NONREACTIVE
Hepatitis B Surface Ag: NONREACTIVE
Hepatitis C Ab: NONREACTIVE

## 2024-02-01 LAB — ALPHA-1 ANTITRYPSIN PHENOTYPE: A-1 Antitrypsin, Ser: 131 mg/dL (ref 83–199)

## 2024-02-01 LAB — IGG: IgG (Immunoglobin G), Serum: 1173 mg/dL (ref 600–1640)

## 2024-02-01 LAB — HEPATITIS A ANTIBODY, TOTAL: Hepatitis A AB,Total: REACTIVE — AB

## 2024-02-01 LAB — ANTI-SMOOTH MUSCLE ANTIBODY, IGG: Actin (Smooth Muscle) Antibody (IGG): <20 U (ref <20)

## 2024-02-01 LAB — HEPATITIS B SURFACE ANTIBODY,QUALITATIVE: Hep B S Ab: REACTIVE — AB

## 2024-02-02 ENCOUNTER — Ambulatory Visit (HOSPITAL_COMMUNITY)
Admission: RE | Admit: 2024-02-02 | Discharge: 2024-02-02 | Disposition: A | Discharge status: Home / Self Care | Source: Ambulatory Visit | Attending: Gastroenterology | Admitting: Gastroenterology

## 2024-02-02 DIAGNOSIS — R7989 Other specified abnormal findings of blood chemistry: Secondary | ICD-10-CM | POA: Insufficient documentation

## 2024-02-06 ENCOUNTER — Ambulatory Visit: Payer: Self-pay | Admitting: Gastroenterology

## 2024-02-23 ENCOUNTER — Encounter (INDEPENDENT_AMBULATORY_CARE_PROVIDER_SITE_OTHER): Payer: Self-pay | Admitting: Gastroenterology

## 2024-02-23 ENCOUNTER — Ambulatory Visit (INDEPENDENT_AMBULATORY_CARE_PROVIDER_SITE_OTHER): Admitting: Gastroenterology

## 2024-02-23 VITALS — BP 116/80 | HR 83 | Temp 97.5°F | Ht 59.0 in | Wt 181.7 lb

## 2024-02-23 DIAGNOSIS — R7401 Elevation of levels of liver transaminase levels: Secondary | ICD-10-CM

## 2024-02-23 DIAGNOSIS — K529 Noninfective gastroenteritis and colitis, unspecified: Secondary | ICD-10-CM | POA: Diagnosis not present

## 2024-02-23 DIAGNOSIS — R7989 Other specified abnormal findings of blood chemistry: Secondary | ICD-10-CM | POA: Insufficient documentation

## 2024-02-23 NOTE — Patient Instructions (Signed)
 Repeat blood workup in 2 months Can implement Mediterranean diet for now

## 2024-02-23 NOTE — Progress Notes (Unsigned)
 Toribio Fortune, M.D. Gastroenterology & Hepatology [facility] Gastroenterology [address] [place], [place] [address]  Primary Care Physician: Vida Mardy DEL, PA-C [address] [place] [address]  I will communicate my assessment and recommendations to the referring MD via EMR.  Problems: Elevated liver function tests, resolved  History of Present Illness: Merary Garguilo is a 33 y.o.  female with past medical history of ADHD, anxiety, who presents for follow-up of elevated LFTs.  The patient was last seen on 01/09/2024. At that time, the patient was advised to start fiber due to change in bowel movements after episode of gastroenteritis.  After our appointment, I was faxed the results of the blood workup performed on 01/23/2024 which showed a CMP with AST 42, ALT 97, total bilirubin 0.6, albumin 4.3, alkaline phosphatase 58, normal kidney function electrolytes.  Due to this, blood workup was performed which showed immunity to hepatitis A/B, negative/normal acute hepatitis panel, ceruloplasmin, alpha 1 antitrypsin, ANA, IgG, ASMA.  Liver ultrasound was within normal limits.  Patient was advised to repeat CMP in 3 months.  States that she has not had too many symptoms. Reports that her bowel movements are hader than usual but havign daily Bms. The patient denies having any nausea, vomiting, fever, chills, hematochezia, melena, hematemesis, abdominal distention, abdominal pain, diarrhea, jaundice, pruritus . Lost some weight due to recent gastroenteritis.  No herbs or supplements, not taking any medicines.  Last ZHI:wzczm Last Colonoscopy:never  Past Medical History: Past Medical History:  Diagnosis Date   ADHD    Anxiety    Gallstone    Nexplanon insertion 01/01/2014   nexplanon inserted 01/01/14 left arm remove 01/01/17   Pregnancy with one fetus     Past Surgical History: Past Surgical History:  Procedure Laterality Date   CHOLECYSTECTOMY   12/03/2011   Procedure: LAPAROSCOPIC CHOLECYSTECTOMY;  Surgeon: Oneil DELENA Budge, MD;  Location: [facility];  Service: General;  Laterality: N/A;   NO PAST SURGERIES      Family History: Family History  Problem Relation Age of Onset   Healthy Father    Healthy Mother    Anesthesia problems Neg Hx     Social History: Social History   Tobacco Use  Smoking Status Never  Smokeless Tobacco Never   Social History   Substance and Sexual Activity  Alcohol  Use No   Social History   Substance and Sexual Activity  Drug Use No    Allergies: Allergies  Allergen Reactions   Penicillins Hives    Medications: No current outpatient medications on file.   No current facility-administered medications for this visit.    Review of Systems: GENERAL: negative for malaise, night sweats HEENT: No changes in hearing or vision, no nose bleeds or other nasal problems. NECK: Negative for lumps, goiter, pain and significant neck swelling RESPIRATORY: Negative for cough, wheezing CARDIOVASCULAR: Negative for chest pain, leg swelling, palpitations, orthopnea GI: SEE HPI MUSCULOSKELETAL: Negative for joint pain or swelling, back pain, and muscle pain. SKIN: Negative for lesions, rash PSYCH: Negative for sleep disturbance, mood disorder and recent psychosocial stressors. HEMATOLOGY Negative for prolonged bleeding, bruising easily, and swollen nodes. ENDOCRINE: Negative for cold or heat intolerance, polyuria, polydipsia and goiter. NEURO: negative for tremor, gait imbalance, syncope and seizures. The remainder of the review of systems is noncontributory.   Physical Exam: BP 116/80 (BP Location: Left Arm, Patient Position: Sitting, Cuff Size: Large)   Pulse 83   Temp (!) 97.5 F (36.4 C) (Temporal)  Ht 4' 11 (1.499 m)   Wt 181 lb 11.2 oz (82.4 kg)   BMI 36.70 kg/m  GENERAL: The patient is AO x3, in no acute distress. HEENT: Head is normocephalic and atraumatic. EOMI are intact. Mouth is  well hydrated and without lesions. NECK: Supple. No masses LUNGS: Clear to auscultation. No presence of rhonchi/wheezing/rales. Adequate chest expansion HEART: RRR, normal s1 and s2. ABDOMEN: Soft, nontender, no guarding, no peritoneal signs, and nondistended. BS +. No masses. RECTAL EXAM: no external lesions, normal tone, no masses, brown stool without blood.*** Chaperone: EXTREMITIES: Without any cyanosis, clubbing, rash, lesions or edema. NEUROLOGIC: AOx3, no focal motor deficit. SKIN: no jaundice, no rashes  Imaging/Labs: as above  I personally reviewed and interpreted the available labs, imaging and endoscopic files.  Impression and Plan: Falecia Vannatter is a 33 y.o. female coming for follow up of ***   All questions were answered.      Toribio Fortune, MD Gastroenterology and Hepatology [facility] Gastroenterology

## 2024-03-21 ENCOUNTER — Encounter (INDEPENDENT_AMBULATORY_CARE_PROVIDER_SITE_OTHER): Payer: Self-pay | Admitting: Gastroenterology

## 2024-03-27 ENCOUNTER — Other Ambulatory Visit (INDEPENDENT_AMBULATORY_CARE_PROVIDER_SITE_OTHER): Payer: Self-pay

## 2024-03-27 ENCOUNTER — Encounter (INDEPENDENT_AMBULATORY_CARE_PROVIDER_SITE_OTHER): Payer: Self-pay

## 2024-03-27 DIAGNOSIS — R7989 Other specified abnormal findings of blood chemistry: Secondary | ICD-10-CM

## 2024-04-17 LAB — COMPREHENSIVE METABOLIC PANEL WITH GFR
AG Ratio: 1.6 (calc) (ref 1.0–2.5)
ALT: 54 U/L — ABNORMAL HIGH (ref 6–29)
AST: 30 U/L (ref 10–30)
Albumin: 4.2 g/dL (ref 3.6–5.1)
Alkaline phosphatase (APISO): 60 U/L (ref 31–125)
BUN: 7 mg/dL (ref 7–25)
CO2: 28 mmol/L (ref 20–32)
Calcium: 9 mg/dL (ref 8.6–10.2)
Chloride: 106 mmol/L (ref 98–110)
Creat: 0.52 mg/dL (ref 0.50–0.97)
Globulin: 2.7 g/dL (ref 1.9–3.7)
Glucose, Bld: 101 mg/dL — ABNORMAL HIGH (ref 65–99)
Potassium: 4.2 mmol/L (ref 3.5–5.3)
Sodium: 141 mmol/L (ref 135–146)
Total Bilirubin: 0.4 mg/dL (ref 0.2–1.2)
Total Protein: 6.9 g/dL (ref 6.1–8.1)
eGFR: 127 mL/min/1.73m2 (ref >=60)

## 2024-04-17 LAB — CBC WITH DIFFERENTIAL/PLATELET
Absolute Lymphocytes: 2738 {cells}/uL (ref 850–3900)
Absolute Monocytes: 578 {cells}/uL (ref 200–950)
Basophils Absolute: 30 {cells}/uL (ref 0–200)
Basophils Relative: 0.4 %
Eosinophils Absolute: 90 {cells}/uL (ref 15–500)
Eosinophils Relative: 1.2 %
HCT: 39.9 % (ref 35.0–45.0)
Hemoglobin: 13.1 g/dL (ref 11.7–15.5)
MCH: 30.1 pg (ref 27.0–33.0)
MCHC: 32.8 g/dL (ref 32.0–36.0)
MCV: 91.7 fL (ref 80.0–100.0)
MPV: 11.9 fL (ref 7.5–12.5)
Monocytes Relative: 7.7 %
Neutro Abs: 4065 {cells}/uL (ref 1500–7800)
Neutrophils Relative %: 54.2 %
Platelets: 259 Thousand/uL (ref 140–400)
RBC: 4.35 Million/uL (ref 3.80–5.10)
RDW: 12.6 % (ref 11.0–15.0)
Total Lymphocyte: 36.5 %
WBC: 7.5 Thousand/uL (ref 3.8–10.8)

## 2024-04-23 ENCOUNTER — Ambulatory Visit (INDEPENDENT_AMBULATORY_CARE_PROVIDER_SITE_OTHER): Payer: Self-pay | Admitting: Gastroenterology
